# Patient Record
Sex: Male | Born: 1945 | Race: White | Hispanic: No | Marital: Married | Smoking: Former smoker
Health system: Southern US, Community
[De-identification: ages and names within clinical notes are randomized; demographics above are authoritative.]

## PROBLEM LIST (undated history)

## (undated) DIAGNOSIS — K219 Gastro-esophageal reflux disease without esophagitis: Secondary | ICD-10-CM

## (undated) DIAGNOSIS — I10 Essential (primary) hypertension: Secondary | ICD-10-CM

## (undated) DIAGNOSIS — Z91018 Allergy to other foods: Secondary | ICD-10-CM

## (undated) DIAGNOSIS — G4730 Sleep apnea, unspecified: Secondary | ICD-10-CM

## (undated) DIAGNOSIS — G589 Mononeuropathy, unspecified: Secondary | ICD-10-CM

## (undated) HISTORY — PX: APPENDECTOMY: SHX54

## (undated) HISTORY — PX: NASAL SEPTUM SURGERY: SHX37

---

## 2007-06-16 ENCOUNTER — Ambulatory Visit: Payer: Self-pay | Admitting: Otolaryngology

## 2008-03-16 ENCOUNTER — Ambulatory Visit: Payer: Self-pay | Admitting: Oncology

## 2008-03-19 ENCOUNTER — Ambulatory Visit: Payer: Self-pay | Admitting: Oncology

## 2008-04-16 ENCOUNTER — Ambulatory Visit: Payer: Self-pay | Admitting: Oncology

## 2008-05-17 ENCOUNTER — Ambulatory Visit: Payer: Self-pay | Admitting: Internal Medicine

## 2008-06-02 ENCOUNTER — Ambulatory Visit: Payer: Self-pay | Admitting: Internal Medicine

## 2008-06-14 ENCOUNTER — Ambulatory Visit: Payer: Self-pay | Admitting: Internal Medicine

## 2008-06-23 ENCOUNTER — Ambulatory Visit: Payer: Self-pay | Admitting: Internal Medicine

## 2008-07-15 ENCOUNTER — Ambulatory Visit: Payer: Self-pay | Admitting: Internal Medicine

## 2008-08-14 ENCOUNTER — Ambulatory Visit: Payer: Self-pay | Admitting: Internal Medicine

## 2008-08-18 ENCOUNTER — Ambulatory Visit: Payer: Self-pay | Admitting: Internal Medicine

## 2008-09-14 ENCOUNTER — Ambulatory Visit: Payer: Self-pay | Admitting: Internal Medicine

## 2008-11-14 ENCOUNTER — Ambulatory Visit: Payer: Self-pay | Admitting: Internal Medicine

## 2008-12-08 ENCOUNTER — Ambulatory Visit: Payer: Self-pay | Admitting: Internal Medicine

## 2008-12-15 ENCOUNTER — Ambulatory Visit: Payer: Self-pay | Admitting: Internal Medicine

## 2009-03-16 ENCOUNTER — Ambulatory Visit: Payer: Self-pay | Admitting: Internal Medicine

## 2009-03-30 ENCOUNTER — Ambulatory Visit: Payer: Self-pay | Admitting: Internal Medicine

## 2009-04-16 ENCOUNTER — Ambulatory Visit: Payer: Self-pay | Admitting: Internal Medicine

## 2009-07-15 ENCOUNTER — Ambulatory Visit: Payer: Self-pay | Admitting: Internal Medicine

## 2009-08-14 ENCOUNTER — Ambulatory Visit: Payer: Self-pay | Admitting: Internal Medicine

## 2009-11-14 ENCOUNTER — Ambulatory Visit: Payer: Self-pay | Admitting: Internal Medicine

## 2009-11-30 ENCOUNTER — Ambulatory Visit: Payer: Self-pay | Admitting: Internal Medicine

## 2009-12-15 ENCOUNTER — Ambulatory Visit: Payer: Self-pay | Admitting: Internal Medicine

## 2010-01-11 ENCOUNTER — Ambulatory Visit: Payer: Self-pay | Admitting: Internal Medicine

## 2010-03-28 ENCOUNTER — Ambulatory Visit: Payer: Self-pay | Admitting: Internal Medicine

## 2010-04-16 ENCOUNTER — Ambulatory Visit: Payer: Self-pay | Admitting: Internal Medicine

## 2010-06-12 ENCOUNTER — Ambulatory Visit: Payer: Self-pay | Admitting: Internal Medicine

## 2010-06-15 ENCOUNTER — Ambulatory Visit: Payer: Self-pay | Admitting: Internal Medicine

## 2010-10-03 ENCOUNTER — Ambulatory Visit: Payer: Self-pay | Admitting: Internal Medicine

## 2010-10-15 ENCOUNTER — Ambulatory Visit: Payer: Self-pay | Admitting: Internal Medicine

## 2011-05-01 ENCOUNTER — Ambulatory Visit: Payer: Self-pay | Admitting: Internal Medicine

## 2011-05-18 ENCOUNTER — Ambulatory Visit: Payer: Self-pay | Admitting: Internal Medicine

## 2012-02-19 ENCOUNTER — Ambulatory Visit: Payer: Self-pay | Admitting: Internal Medicine

## 2012-03-16 ENCOUNTER — Ambulatory Visit: Payer: Self-pay | Admitting: Internal Medicine

## 2012-08-14 ENCOUNTER — Ambulatory Visit: Payer: Self-pay | Admitting: Internal Medicine

## 2012-08-14 LAB — CBC CANCER CENTER
Basophil #: 0.1 x10 3/mm (ref 0.0–0.1)
Basophil %: 0.3 %
Eosinophil #: 0.1 x10 3/mm (ref 0.0–0.7)
Eosinophil %: 0.2 %
HCT: 47.2 % (ref 40.0–52.0)
Lymphocyte #: 23.7 x10 3/mm — ABNORMAL HIGH (ref 1.0–3.6)
Lymphocyte %: 79.1 %
MCH: 29.3 pg (ref 26.0–34.0)
MCHC: 32.3 g/dL (ref 32.0–36.0)
Monocyte #: 0.6 x10 3/mm (ref 0.2–1.0)
Monocyte %: 2.2 %
Platelet: 157 x10 3/mm (ref 150–440)
RBC: 5.2 10*6/uL (ref 4.40–5.90)
RDW: 14.3 % (ref 11.5–14.5)
WBC: 30 x10 3/mm — ABNORMAL HIGH (ref 3.8–10.6)

## 2012-08-14 LAB — CREATININE, SERUM: EGFR (African American): >60

## 2012-08-14 LAB — URIC ACID: Uric Acid: 4.6 mg/dL (ref 3.5–7.2)

## 2012-09-14 ENCOUNTER — Ambulatory Visit: Payer: Self-pay | Admitting: Internal Medicine

## 2012-12-02 ENCOUNTER — Ambulatory Visit: Payer: Self-pay | Admitting: Internal Medicine

## 2012-12-02 LAB — URIC ACID: Uric Acid: 4.1 mg/dL (ref 3.5–7.2)

## 2012-12-02 LAB — CBC CANCER CENTER
Basophil #: 0.1 x10 3/mm (ref 0.0–0.1)
Basophil %: 0.2 %
Eosinophil #: 0.1 x10 3/mm (ref 0.0–0.7)
Eosinophil %: 0.4 %
HCT: 44.8 % (ref 40.0–52.0)
HGB: 14.8 g/dL (ref 13.0–18.0)
Lymphocyte #: 22.8 x10 3/mm — ABNORMAL HIGH (ref 1.0–3.6)
Lymphocyte %: 84.9 %
MCH: 30.1 pg (ref 26.0–34.0)
Neutrophil %: 12.3 %
Platelet: 134 x10 3/mm — ABNORMAL LOW (ref 150–440)
RDW: 13.8 % (ref 11.5–14.5)

## 2012-12-02 LAB — CREATININE, SERUM: EGFR (Non-African Amer.): >60

## 2012-12-15 ENCOUNTER — Ambulatory Visit: Payer: Self-pay | Admitting: Internal Medicine

## 2012-12-25 LAB — CANCER CTR PLATELET CT: Platelet: 135 x10 3/mm — ABNORMAL LOW (ref 150–440)

## 2013-01-14 ENCOUNTER — Ambulatory Visit: Payer: Self-pay | Admitting: Internal Medicine

## 2013-02-24 ENCOUNTER — Ambulatory Visit: Payer: Self-pay | Admitting: Internal Medicine

## 2013-02-24 LAB — LACTATE DEHYDROGENASE: LDH: 204 U/L (ref 85–241)

## 2013-02-24 LAB — URIC ACID: Uric Acid: 4.8 mg/dL (ref 3.5–7.2)

## 2013-02-24 LAB — CBC CANCER CENTER
Basophil #: 0.1 x10 3/mm (ref 0.0–0.1)
Eosinophil #: 0.2 x10 3/mm (ref 0.0–0.7)
Eosinophil %: 0.5 %
Lymphocyte #: 27.2 x10 3/mm — ABNORMAL HIGH (ref 1.0–3.6)
MCHC: 32.2 g/dL (ref 32.0–36.0)
Monocyte %: 2 %
Platelet: 134 x10 3/mm — ABNORMAL LOW (ref 150–440)
RBC: 5.09 10*6/uL (ref 4.40–5.90)
RDW: 13.9 % (ref 11.5–14.5)
WBC: 31.7 x10 3/mm — ABNORMAL HIGH (ref 3.8–10.6)

## 2013-03-16 ENCOUNTER — Ambulatory Visit: Payer: Self-pay | Admitting: Internal Medicine

## 2013-04-14 LAB — CBC CANCER CENTER
Basophil #: 0.1 x10 3/mm (ref 0.0–0.1)
Basophil %: 0.2 %
Eosinophil %: 0.2 %
HCT: 47 % (ref 40.0–52.0)
HGB: 15.3 g/dL (ref 13.0–18.0)
Lymphocyte #: 26.3 x10 3/mm — ABNORMAL HIGH (ref 1.0–3.6)
Lymphocyte %: 85.9 %
MCH: 30.4 pg (ref 26.0–34.0)
MCHC: 32.6 g/dL (ref 32.0–36.0)
MCV: 93 fL (ref 80–100)
Monocyte #: 0.5 x10 3/mm (ref 0.2–1.0)
Monocyte %: 1.6 %
Neutrophil #: 3.7 x10 3/mm (ref 1.4–6.5)
WBC: 30.7 x10 3/mm — ABNORMAL HIGH (ref 3.8–10.6)

## 2013-04-16 ENCOUNTER — Ambulatory Visit: Payer: Self-pay | Admitting: Internal Medicine

## 2013-04-30 ENCOUNTER — Ambulatory Visit: Payer: Self-pay | Admitting: Otolaryngology

## 2013-05-26 ENCOUNTER — Ambulatory Visit: Payer: Self-pay | Admitting: Internal Medicine

## 2013-05-26 LAB — CBC CANCER CENTER
Basophil #: 0.1 x10 3/mm (ref 0.0–0.1)
Basophil %: 0.3 %
Eosinophil #: 0.1 x10 3/mm (ref 0.0–0.7)
Eosinophil %: 0.4 %
HCT: 46.7 % (ref 40.0–52.0)
HGB: 15.2 g/dL (ref 13.0–18.0)
Lymphocyte #: 28.8 x10 3/mm — ABNORMAL HIGH (ref 1.0–3.6)
Lymphocyte %: 86.4 %
MCH: 30 pg (ref 26.0–34.0)
MCHC: 32.4 g/dL (ref 32.0–36.0)
MCV: 93 fL (ref 80–100)
MONO ABS: 0.6 x10 3/mm (ref 0.2–1.0)
Monocyte %: 1.7 %
NEUTROS PCT: 11.2 %
Neutrophil #: 3.7 x10 3/mm (ref 1.4–6.5)
PLATELETS: 145 x10 3/mm — AB (ref 150–440)
RBC: 5.04 10*6/uL (ref 4.40–5.90)
RDW: 14.2 % (ref 11.5–14.5)
WBC: 33.3 x10 3/mm — ABNORMAL HIGH (ref 3.8–10.6)

## 2013-06-14 ENCOUNTER — Ambulatory Visit: Payer: Self-pay | Admitting: Internal Medicine

## 2013-07-21 ENCOUNTER — Ambulatory Visit: Payer: Self-pay | Admitting: Internal Medicine

## 2013-07-21 LAB — CREATININE, SERUM
CREATININE: 1.23 mg/dL (ref 0.60–1.30)
EGFR (Non-African Amer.): >60

## 2013-07-21 LAB — CBC CANCER CENTER
BASOS ABS: 0 x10 3/mm (ref 0.0–0.1)
Basophil %: 0.1 %
EOS ABS: 0.1 x10 3/mm (ref 0.0–0.7)
Eosinophil %: 0.3 %
HCT: 45.7 % (ref 40.0–52.0)
HGB: 14.7 g/dL (ref 13.0–18.0)
Lymphocyte #: 30 x10 3/mm — ABNORMAL HIGH (ref 1.0–3.6)
Lymphocyte %: 87.8 %
MCH: 30.1 pg (ref 26.0–34.0)
MCHC: 32.2 g/dL (ref 32.0–36.0)
MCV: 93 fL (ref 80–100)
MONOS PCT: 1.7 %
Monocyte #: 0.6 x10 3/mm (ref 0.2–1.0)
NEUTROS ABS: 3.4 x10 3/mm (ref 1.4–6.5)
Neutrophil %: 10.1 %
Platelet: 127 x10 3/mm — ABNORMAL LOW (ref 150–440)
RBC: 4.89 10*6/uL (ref 4.40–5.90)
RDW: 13.9 % (ref 11.5–14.5)
WBC: 34.2 x10 3/mm — AB (ref 3.8–10.6)

## 2013-07-21 LAB — LACTATE DEHYDROGENASE: LDH: 167 U/L (ref 85–241)

## 2013-07-21 LAB — URIC ACID: URIC ACID: 4.8 mg/dL (ref 3.5–7.2)

## 2013-08-14 ENCOUNTER — Ambulatory Visit: Payer: Self-pay | Admitting: Internal Medicine

## 2013-09-22 ENCOUNTER — Ambulatory Visit: Payer: Self-pay | Admitting: Internal Medicine

## 2013-09-22 LAB — CBC CANCER CENTER
BASOS ABS: 0.1 x10 3/mm (ref 0.0–0.1)
BASOS PCT: 0.2 %
EOS ABS: 0.1 x10 3/mm (ref 0.0–0.7)
Eosinophil %: 0.4 %
HCT: 43.7 % (ref 40.0–52.0)
HGB: 14.3 g/dL (ref 13.0–18.0)
LYMPHS PCT: 85 %
Lymphocyte #: 26.2 x10 3/mm — ABNORMAL HIGH (ref 1.0–3.6)
MCH: 30.4 pg (ref 26.0–34.0)
MCHC: 32.7 g/dL (ref 32.0–36.0)
MCV: 93 fL (ref 80–100)
Monocyte #: 0.6 x10 3/mm (ref 0.2–1.0)
Monocyte %: 1.9 %
Neutrophil #: 3.9 x10 3/mm (ref 1.4–6.5)
Neutrophil %: 12.5 %
Platelet: 137 x10 3/mm — ABNORMAL LOW (ref 150–440)
RBC: 4.7 10*6/uL (ref 4.40–5.90)
RDW: 13.9 % (ref 11.5–14.5)
WBC: 30.8 x10 3/mm — ABNORMAL HIGH (ref 3.8–10.6)

## 2013-10-14 ENCOUNTER — Ambulatory Visit: Payer: Self-pay | Admitting: Internal Medicine

## 2013-10-27 LAB — CBC CANCER CENTER
BASOS PCT: 0.1 %
Basophil #: 0 x10 3/mm (ref 0.0–0.1)
EOS PCT: 0.2 %
Eosinophil #: 0.1 x10 3/mm (ref 0.0–0.7)
HCT: 45.5 % (ref 40.0–52.0)
HGB: 14.8 g/dL (ref 13.0–18.0)
Lymphocyte #: 25.6 x10 3/mm — ABNORMAL HIGH (ref 1.0–3.6)
Lymphocyte %: 87.2 %
MCH: 30.2 pg (ref 26.0–34.0)
MCHC: 32.5 g/dL (ref 32.0–36.0)
MCV: 93 fL (ref 80–100)
Monocyte #: 0.5 x10 3/mm (ref 0.2–1.0)
Monocyte %: 1.7 %
Neutrophil #: 3.2 x10 3/mm (ref 1.4–6.5)
Neutrophil %: 10.8 %
Platelet: 126 x10 3/mm — ABNORMAL LOW (ref 150–440)
RBC: 4.89 10*6/uL (ref 4.40–5.90)
RDW: 14.1 % (ref 11.5–14.5)
WBC: 29.4 x10 3/mm — AB (ref 3.8–10.6)

## 2013-10-27 LAB — URIC ACID: Uric Acid: 3.7 mg/dL (ref 3.5–7.2)

## 2013-10-27 LAB — CREATININE, SERUM
CREATININE: 1.03 mg/dL (ref 0.60–1.30)
EGFR (African American): >60

## 2013-10-27 LAB — IRON AND TIBC
IRON SATURATION: 18 %
IRON: 60 ug/dL — AB (ref 65–175)
Iron Bind.Cap.(Total): 328 ug/dL (ref 250–450)
UNBOUND IRON-BIND. CAP.: 268 ug/dL

## 2013-10-27 LAB — FERRITIN: Ferritin (ARMC): 63 ng/mL (ref 8–388)

## 2013-11-14 ENCOUNTER — Ambulatory Visit: Payer: Self-pay | Admitting: Internal Medicine

## 2013-12-15 ENCOUNTER — Ambulatory Visit: Payer: Self-pay | Admitting: Internal Medicine

## 2013-12-15 LAB — CBC CANCER CENTER
BASOS ABS: 0 x10 3/mm (ref 0.0–0.1)
Basophil %: 0.1 %
Eosinophil #: 0.1 x10 3/mm (ref 0.0–0.7)
Eosinophil %: 0.3 %
HCT: 46.3 % (ref 40.0–52.0)
HGB: 14.7 g/dL (ref 13.0–18.0)
LYMPHS PCT: 86.6 %
Lymphocyte #: 29.9 x10 3/mm — ABNORMAL HIGH (ref 1.0–3.6)
MCH: 29.7 pg (ref 26.0–34.0)
MCHC: 31.8 g/dL — AB (ref 32.0–36.0)
MCV: 93 fL (ref 80–100)
MONO ABS: 0.5 x10 3/mm (ref 0.2–1.0)
MONOS PCT: 1.5 %
Neutrophil #: 4 x10 3/mm (ref 1.4–6.5)
Neutrophil %: 11.5 %
Platelet: 130 x10 3/mm — ABNORMAL LOW (ref 150–440)
RBC: 4.96 10*6/uL (ref 4.40–5.90)
RDW: 14.2 % (ref 11.5–14.5)
WBC: 34.5 x10 3/mm — ABNORMAL HIGH (ref 3.8–10.6)

## 2013-12-15 LAB — CREATININE, SERUM
Creatinine: 1.18 mg/dL (ref 0.60–1.30)
EGFR (African American): >60

## 2013-12-15 LAB — LACTATE DEHYDROGENASE: LDH: 216 U/L (ref 85–241)

## 2013-12-15 LAB — URIC ACID: Uric Acid: 3.9 mg/dL (ref 3.5–7.2)

## 2013-12-18 LAB — OCCULT BLOOD X 1 CARD TO LAB, STOOL
OCCULT BLOOD, FECES: NEGATIVE
Occult Blood, Feces: NEGATIVE
Occult Blood, Feces: NEGATIVE

## 2014-01-05 LAB — IRON AND TIBC
IRON SATURATION: 24 %
Iron Bind.Cap.(Total): 340 ug/dL (ref 250–450)
Iron: 83 ug/dL (ref 65–175)
Unbound Iron-Bind.Cap.: 257 ug/dL

## 2014-01-05 LAB — FERRITIN: Ferritin (ARMC): 68 ng/mL (ref 8–388)

## 2014-01-14 ENCOUNTER — Ambulatory Visit: Payer: Self-pay | Admitting: Internal Medicine

## 2014-03-08 DIAGNOSIS — C9110 Chronic lymphocytic leukemia of B-cell type not having achieved remission: Secondary | ICD-10-CM | POA: Insufficient documentation

## 2014-03-30 ENCOUNTER — Ambulatory Visit: Payer: Self-pay | Admitting: Internal Medicine

## 2014-06-30 ENCOUNTER — Ambulatory Visit
Admit: 2014-06-30 | Disposition: A | Discharge status: Home / Self Care | Payer: Self-pay | Attending: Internal Medicine | Admitting: Internal Medicine

## 2014-07-16 ENCOUNTER — Ambulatory Visit
Admit: 2014-07-16 | Disposition: A | Discharge status: Home / Self Care | Payer: Self-pay | Attending: Internal Medicine | Admitting: Internal Medicine

## 2014-08-03 LAB — CBC CANCER CENTER
BASOS PCT: 0.2 %
Basophil #: 0.1 x10 3/mm (ref 0.0–0.1)
EOS PCT: 0.2 %
Eosinophil #: 0.1 x10 3/mm (ref 0.0–0.7)
HCT: 45.2 % (ref 40.0–52.0)
HGB: 14.9 g/dL (ref 13.0–18.0)
LYMPHS ABS: 34.2 x10 3/mm — AB (ref 1.0–3.6)
Lymphocyte %: 88 %
MCH: 29.8 pg (ref 26.0–34.0)
MCHC: 32.9 g/dL (ref 32.0–36.0)
MCV: 91 fL (ref 80–100)
Monocyte #: 0.7 x10 3/mm (ref 0.2–1.0)
Monocyte %: 1.9 %
Neutrophil #: 3.8 x10 3/mm (ref 1.4–6.5)
Neutrophil %: 9.7 %
Platelet: 127 x10 3/mm — ABNORMAL LOW (ref 150–440)
RBC: 4.99 10*6/uL (ref 4.40–5.90)
RDW: 14 % (ref 11.5–14.5)
WBC: 38.9 x10 3/mm — ABNORMAL HIGH (ref 3.8–10.6)

## 2014-08-03 LAB — IRON AND TIBC
IRON SATURATION: 36
IRON: 129 ug/dL
Iron Bind.Cap.(Total): 358 (ref 250–450)
UNBOUND IRON-BIND. CAP.: 228.8

## 2014-08-03 LAB — URIC ACID: Uric Acid: 5.6 mg/dL

## 2014-08-03 LAB — LACTATE DEHYDROGENASE: LDH: 145 U/L

## 2014-08-03 LAB — CREATININE, SERUM
Creatinine: 1.08 mg/dL
EGFR (African American): >60

## 2014-08-05 DIAGNOSIS — I10 Essential (primary) hypertension: Secondary | ICD-10-CM | POA: Insufficient documentation

## 2014-08-05 LAB — OCCULT BLOOD X 1 CARD TO LAB, STOOL
OCCULT BLOOD, FECES: NEGATIVE
OCCULT BLOOD, FECES: NEGATIVE
Occult Blood, Feces: NEGATIVE

## 2014-10-11 ENCOUNTER — Other Ambulatory Visit: Payer: Self-pay | Admitting: *Deleted

## 2014-10-11 DIAGNOSIS — C9110 Chronic lymphocytic leukemia of B-cell type not having achieved remission: Secondary | ICD-10-CM

## 2014-10-12 ENCOUNTER — Other Ambulatory Visit: Payer: Self-pay

## 2014-10-12 ENCOUNTER — Inpatient Hospital Stay: Payer: Medicare Other | Attending: Oncology

## 2014-10-12 ENCOUNTER — Other Ambulatory Visit: Payer: Self-pay | Admitting: *Deleted

## 2014-10-12 ENCOUNTER — Other Ambulatory Visit: Payer: Self-pay | Admitting: Family Medicine

## 2014-10-12 DIAGNOSIS — C9110 Chronic lymphocytic leukemia of B-cell type not having achieved remission: Secondary | ICD-10-CM | POA: Insufficient documentation

## 2014-10-12 LAB — CBC WITH DIFFERENTIAL/PLATELET
BASOS ABS: 0.1 10*3/uL (ref 0–0.1)
BASOS PCT: 0 %
Eosinophils Absolute: 0.1 10*3/uL (ref 0–0.7)
Eosinophils Relative: 0 %
HEMATOCRIT: 43.9 % (ref 40.0–52.0)
Hemoglobin: 14.5 g/dL (ref 13.0–18.0)
Lymphocytes Relative: 87 %
Lymphs Abs: 27.9 10*3/uL — ABNORMAL HIGH (ref 1.0–3.6)
MCH: 30.1 pg (ref 26.0–34.0)
MCHC: 33.1 g/dL (ref 32.0–36.0)
MCV: 91 fL (ref 80.0–100.0)
MONO ABS: 0.6 10*3/uL (ref 0.2–1.0)
Monocytes Relative: 2 %
NEUTROS ABS: 3.4 10*3/uL (ref 1.4–6.5)
Neutrophils Relative %: 11 %
Platelets: 120 10*3/uL — ABNORMAL LOW (ref 150–440)
RBC: 4.82 MIL/uL (ref 4.40–5.90)
RDW: 14.1 % (ref 11.5–14.5)
WBC: 32 10*3/uL — AB (ref 3.8–10.6)

## 2014-10-12 LAB — CREATININE, SERUM
Creatinine, Ser: 1.17 mg/dL (ref 0.61–1.24)
GFR calc Af Amer: >60 mL/min (ref >60)
GFR calc non Af Amer: >60 mL/min (ref >60)

## 2014-10-12 LAB — URIC ACID: URIC ACID, SERUM: 6.2 mg/dL (ref 4.4–7.6)

## 2014-10-12 LAB — LACTATE DEHYDROGENASE: LDH: 131 U/L (ref 98–192)

## 2014-12-09 ENCOUNTER — Ambulatory Visit: Payer: Self-pay | Admitting: Oncology

## 2014-12-09 ENCOUNTER — Other Ambulatory Visit: Payer: Self-pay

## 2014-12-23 ENCOUNTER — Other Ambulatory Visit: Payer: Self-pay | Admitting: *Deleted

## 2014-12-23 ENCOUNTER — Encounter: Payer: Self-pay | Admitting: Oncology

## 2014-12-23 ENCOUNTER — Inpatient Hospital Stay (HOSPITAL_BASED_OUTPATIENT_CLINIC_OR_DEPARTMENT_OTHER): Payer: Medicare Other | Admitting: Oncology

## 2014-12-23 ENCOUNTER — Inpatient Hospital Stay: Payer: Medicare Other | Attending: Oncology

## 2014-12-23 VITALS — BP 118/69 | HR 66 | Temp 97.5°F | Wt 195.4 lb

## 2014-12-23 DIAGNOSIS — E039 Hypothyroidism, unspecified: Secondary | ICD-10-CM | POA: Insufficient documentation

## 2014-12-23 DIAGNOSIS — R5383 Other fatigue: Secondary | ICD-10-CM | POA: Diagnosis not present

## 2014-12-23 DIAGNOSIS — Z79899 Other long term (current) drug therapy: Secondary | ICD-10-CM

## 2014-12-23 DIAGNOSIS — C9110 Chronic lymphocytic leukemia of B-cell type not having achieved remission: Secondary | ICD-10-CM | POA: Diagnosis not present

## 2014-12-23 DIAGNOSIS — Z87891 Personal history of nicotine dependence: Secondary | ICD-10-CM | POA: Insufficient documentation

## 2014-12-23 DIAGNOSIS — K589 Irritable bowel syndrome without diarrhea: Secondary | ICD-10-CM | POA: Diagnosis not present

## 2014-12-23 DIAGNOSIS — I341 Nonrheumatic mitral (valve) prolapse: Secondary | ICD-10-CM | POA: Insufficient documentation

## 2014-12-23 LAB — CBC WITH DIFFERENTIAL/PLATELET
BASOS PCT: 0 %
Basophils Absolute: 0.1 10*3/uL (ref 0–0.1)
Eosinophils Absolute: 0.1 10*3/uL (ref 0–0.7)
Eosinophils Relative: 0 %
HEMATOCRIT: 46.4 % (ref 40.0–52.0)
HEMOGLOBIN: 15.3 g/dL (ref 13.0–18.0)
LYMPHS ABS: 30.1 10*3/uL — AB (ref 1.0–3.6)
Lymphocytes Relative: 87 %
MCH: 30.4 pg (ref 26.0–34.0)
MCHC: 33 g/dL (ref 32.0–36.0)
MCV: 92.2 fL (ref 80.0–100.0)
MONO ABS: 0.6 10*3/uL (ref 0.2–1.0)
Monocytes Relative: 2 %
NEUTROS ABS: 3.6 10*3/uL (ref 1.4–6.5)
NEUTROS PCT: 11 %
Platelets: 128 10*3/uL — ABNORMAL LOW (ref 150–440)
RBC: 5.03 MIL/uL (ref 4.40–5.90)
RDW: 13.9 % (ref 11.5–14.5)
WBC: 34.5 10*3/uL — ABNORMAL HIGH (ref 3.8–10.6)

## 2014-12-23 LAB — URIC ACID: URIC ACID, SERUM: 6 mg/dL (ref 4.4–7.6)

## 2014-12-23 LAB — LACTATE DEHYDROGENASE: LDH: 138 U/L (ref 98–192)

## 2014-12-23 LAB — CREATININE, SERUM
Creatinine, Ser: 1.24 mg/dL (ref 0.61–1.24)
GFR calc Af Amer: >60 mL/min (ref >60)
GFR calc non Af Amer: 58 mL/min — ABNORMAL LOW (ref >60)

## 2014-12-23 NOTE — Progress Notes (Signed)
Patient former smoker.  Patient here today for CLL.  Patient of Dr. Inez Pilgrim.  Patient does have living will.  Not on file.  Patient to bring copy.

## 2014-12-25 ENCOUNTER — Encounter: Payer: Self-pay | Admitting: Oncology

## 2014-12-25 DIAGNOSIS — E039 Hypothyroidism, unspecified: Secondary | ICD-10-CM | POA: Insufficient documentation

## 2014-12-25 DIAGNOSIS — G4730 Sleep apnea, unspecified: Secondary | ICD-10-CM | POA: Insufficient documentation

## 2014-12-25 DIAGNOSIS — I341 Nonrheumatic mitral (valve) prolapse: Secondary | ICD-10-CM | POA: Insufficient documentation

## 2014-12-25 NOTE — Progress Notes (Signed)
[place] @ [facility] Telephone:(336) [phone]  Fax:(336) [phone]     [place] OB: 11/16/1945  MR#: [id number]  ULA#:[id number]  Patient Care Team: Rusty Aus, MD as PCP - General (Internal Medicine)  CHIEF COMPLAINT:  Chief Complaint  Patient presents with  . Fatigue   DIAGNOSIS:  Chief Complaint/Diagnosis   CLL, 5% with 13q deletion, Zap-70 negative.  Rai I stage 0.  FlOW CYTOMETRY PROFILE 11/29/08   INTERVAL HISTORY: 69 year old gentleman came today for further follow-up regarding stage 0 chronic lymphocytic leukemia.  Patient complains of fatigue\ No other significant complaints. REVIEW OF SYSTEMS:   \GENERAL:  Feels good.  Active.  No fevers, sweats or weight loss. PERFORMANCE STATUS (ECOG):0 HEENT:  No visual changes, runny nose, sore throat, mouth sores or tenderness. Lungs: No shortness of breath or cough.  No hemoptysis. Cardiac:  No chest pain, palpitations, orthopnea, or PND. GI:  No nausea, vomiting, diarrhea, constipation, melena or hematochezia. GU:  No urgency, frequency, dysuria, or hematuria. Musculoskeletal:  No back pain.  No joint pain.  No muscle tenderness. Extremities:  No pain or swelling. Skin:  No rashes or skin changes. Neuro:  No headache, numbness or weakness, balance or coordination issues. Endocrine:  No diabetes, thyroid issues, hot flashes or night sweats. Psych:  No mood changes, depression or anxiety. Pain:  No focal pain. Review of systems:  All other systems reviewed and found to be negative. As per HPI. Otherwise, a complete review of systems is negatve.   PFSH:  Additional Past Medical and Surgical History Past medical history: Hypothyroidism, mitral valve prolapse, IBS.  Past surgical history: Appendectomy.  Family history: Negative and noncontributory.  Social history: Denies tobacco, occasional alcohol.   ADVANCED DIRECTIVES:  Patient does have advance healthcare directive, Patient   does not desire to make any  changes HEALTH MAINTENANCE: Social History  Substance Use Topics  . Smoking status: Former Research scientist (life sciences)  . Smokeless tobacco: None  . Alcohol Use: None      Allergies  Allergen Reactions  . Sertraline Hcl Diarrhea    Current Outpatient Prescriptions  Medication Sig Dispense Refill  . buPROPion (WELLBUTRIN XL) 150 MG 24 hr tablet Take by mouth.    . etodolac (LODINE) 400 MG tablet Take by mouth.    . levothyroxine (SYNTHROID, LEVOTHROID) 125 MCG tablet Take by mouth.    Marland Kitchen lisinopril-hydrochlorothiazide (PRINZIDE,ZESTORETIC) 10-12.5 MG per tablet TAKE ONE TABLET BY MOUTH ONCE DAILY    . zolpidem (AMBIEN) 10 MG tablet TAKE ONE TABLET BY MOUTH AT BEDTIME    . gabapentin (NEURONTIN) 300 MG capsule      No current facility-administered medications for this visit.    OBJECTIVE:  Filed Vitals:   12/23/14 1405  BP: 118/69  Pulse: 66  Temp: 97.5 F (36.4 C)     There is no height on file to calculate BMI.    ECOG FS:0 - Asymptomatic  PHYSICAL EXAM: Goal status: Performance status is good.  Patient has not lost significant weight HEENT: No evidence of stomatitis. Sclera and conjunctivae :: No jaundice.   pale looking. Lungs: Air  entry equal on both sides.  No rhonchi.  No rales.  Cardiac: Heart sounds are normal.  No pericardial rub.  No murmur. Lymphatic system: Cervical, axillary, inguinal, lymph nodes not palpable GI: Abdomen is soft.  No ascites.  Liver spleen not palpable.  No tenderness.  Bowel sounds are within normal limit Lower extremity: No edema Neurological system: Higher functions, cranial nerves intact no evidence  of peripheral neuropathy. Skin: No rash.  No ecchymosis.Marland Kitchen   LAB RESULTS:  CBC Latest Ref Rng 12/23/2014 10/12/2014  WBC 3.8 - 10.6 K/uL 34.5(H) 32.0(H)  Hemoglobin 13.0 - 18.0 g/dL 15.3 14.5  Hematocrit 40.0 - 52.0 % 46.4 43.9  Platelets 150 - 440 K/uL 128(L) 120(L)    Appointment on 12/23/2014  Component Date Value Ref Range Status  . WBC 12/23/2014  34.5* 3.8 - 10.6 K/uL Final  . RBC 12/23/2014 5.03  4.40 - 5.90 MIL/uL Final  . Hemoglobin 12/23/2014 15.3  13.0 - 18.0 g/dL Final  . HCT 12/23/2014 46.4  40.0 - 52.0 % Final  . MCV 12/23/2014 92.2  80.0 - 100.0 fL Final  . MCH 12/23/2014 30.4  26.0 - 34.0 pg Final  . MCHC 12/23/2014 33.0  32.0 - 36.0 g/dL Final  . RDW 12/23/2014 13.9  11.5 - 14.5 % Final  . Platelets 12/23/2014 128* 150 - 440 K/uL Final  . Neutrophils Relative % 12/23/2014 11   Final  . Neutro Abs 12/23/2014 3.6  1.4 - 6.5 K/uL Final  . Lymphocytes Relative 12/23/2014 87   Final  . Lymphs Abs 12/23/2014 30.1* 1.0 - 3.6 K/uL Final  . Monocytes Relative 12/23/2014 2   Final  . Monocytes Absolute 12/23/2014 0.6  0.2 - 1.0 K/uL Final  . Eosinophils Relative 12/23/2014 0   Final  . Eosinophils Absolute 12/23/2014 0.1  0 - 0.7 K/uL Final  . Basophils Relative 12/23/2014 0   Final  . Basophils Absolute 12/23/2014 0.1  0 - 0.1 K/uL Final  . Creatinine, Ser 12/23/2014 1.24  0.61 - 1.24 mg/dL Final  . GFR calc non Af Amer 12/23/2014 58* >60 mL/min Final  . GFR calc Af Amer 12/23/2014 >60  >60 mL/min Final   Comment: (NOTE) The eGFR has been calculated using the CKD EPI equation. This calculation has not been validated in all clinical situations. eGFR's persistently <60 mL/min signify possible Chronic Kidney Disease.   Marland Kitchen LDH 12/23/2014 138  98 - 192 U/L Final  . Uric Acid, Serum 12/23/2014 6.0  4.4 - 7.6 mg/dL Final        ASSESSMENT: Chronic lymphocytic leukemia stage 0 13 every deletion MEDICAL DECISION MAKING:  All lab data has been reviewed.  CBC has remained stable worker of one year.  Now will be switched over to every 4 months checkup with blood count if it remains stable for one year and then every 6 month. Patient was advised to call me if there is any significant problem  Patient expressed understanding and was in agreement with this plan. He also understands that He can call clinic at any time with any  questions, concerns, or complaints.    No matching staging information was found for the patient.  Forest Gleason, MD   12/25/2014 9:11 AM

## 2015-04-25 ENCOUNTER — Inpatient Hospital Stay: Payer: Medicare Other | Admitting: Oncology

## 2015-04-25 ENCOUNTER — Inpatient Hospital Stay: Payer: Medicare Other

## 2015-04-25 ENCOUNTER — Inpatient Hospital Stay: Payer: Medicare Other | Attending: Oncology

## 2015-04-25 DIAGNOSIS — I341 Nonrheumatic mitral (valve) prolapse: Secondary | ICD-10-CM | POA: Insufficient documentation

## 2015-04-25 DIAGNOSIS — C9110 Chronic lymphocytic leukemia of B-cell type not having achieved remission: Secondary | ICD-10-CM | POA: Insufficient documentation

## 2015-04-25 DIAGNOSIS — Z87891 Personal history of nicotine dependence: Secondary | ICD-10-CM | POA: Insufficient documentation

## 2015-04-25 DIAGNOSIS — E039 Hypothyroidism, unspecified: Secondary | ICD-10-CM | POA: Insufficient documentation

## 2015-04-25 DIAGNOSIS — K589 Irritable bowel syndrome without diarrhea: Secondary | ICD-10-CM | POA: Insufficient documentation

## 2015-04-25 DIAGNOSIS — Z79899 Other long term (current) drug therapy: Secondary | ICD-10-CM | POA: Insufficient documentation

## 2015-05-16 ENCOUNTER — Ambulatory Visit: Payer: Medicare Other | Admitting: Oncology

## 2015-05-16 ENCOUNTER — Other Ambulatory Visit: Payer: Medicare Other

## 2015-05-16 ENCOUNTER — Inpatient Hospital Stay (HOSPITAL_BASED_OUTPATIENT_CLINIC_OR_DEPARTMENT_OTHER): Payer: Medicare Other | Admitting: Oncology

## 2015-05-16 ENCOUNTER — Inpatient Hospital Stay: Payer: Medicare Other

## 2015-05-16 ENCOUNTER — Encounter: Payer: Self-pay | Admitting: Oncology

## 2015-05-16 VITALS — BP 128/84 | HR 57 | Temp 96.7°F | Wt 197.1 lb

## 2015-05-16 DIAGNOSIS — Z79899 Other long term (current) drug therapy: Secondary | ICD-10-CM

## 2015-05-16 DIAGNOSIS — C9110 Chronic lymphocytic leukemia of B-cell type not having achieved remission: Secondary | ICD-10-CM | POA: Diagnosis present

## 2015-05-16 DIAGNOSIS — E039 Hypothyroidism, unspecified: Secondary | ICD-10-CM | POA: Diagnosis not present

## 2015-05-16 DIAGNOSIS — K589 Irritable bowel syndrome without diarrhea: Secondary | ICD-10-CM | POA: Diagnosis not present

## 2015-05-16 DIAGNOSIS — Z87891 Personal history of nicotine dependence: Secondary | ICD-10-CM | POA: Diagnosis not present

## 2015-05-16 DIAGNOSIS — I341 Nonrheumatic mitral (valve) prolapse: Secondary | ICD-10-CM | POA: Diagnosis not present

## 2015-05-16 LAB — CBC WITH DIFFERENTIAL/PLATELET
BASOS PCT: 0 %
Basophils Absolute: 0 10*3/uL (ref 0–0.1)
EOS ABS: 0.1 10*3/uL (ref 0–0.7)
EOS PCT: 0 %
HCT: 46.2 % (ref 40.0–52.0)
Hemoglobin: 15.5 g/dL (ref 13.0–18.0)
LYMPHS ABS: 35.2 10*3/uL — AB (ref 1.0–3.6)
Lymphocytes Relative: 89 %
MCH: 30.4 pg (ref 26.0–34.0)
MCHC: 33.6 g/dL (ref 32.0–36.0)
MCV: 90.4 fL (ref 80.0–100.0)
Monocytes Absolute: 0.7 10*3/uL (ref 0.2–1.0)
Monocytes Relative: 2 %
Neutro Abs: 3.6 10*3/uL (ref 1.4–6.5)
Neutrophils Relative %: 9 %
PLATELETS: 115 10*3/uL — AB (ref 150–440)
RBC: 5.11 MIL/uL (ref 4.40–5.90)
RDW: 14 % (ref 11.5–14.5)
WBC: 39.6 10*3/uL — ABNORMAL HIGH (ref 3.8–10.6)

## 2015-05-16 LAB — COMPREHENSIVE METABOLIC PANEL
ALBUMIN: 4.2 g/dL (ref 3.5–5.0)
ALT: 23 U/L (ref 17–63)
ANION GAP: 4 — AB (ref 5–15)
AST: 26 U/L (ref 15–41)
Alkaline Phosphatase: 61 U/L (ref 38–126)
BUN: 20 mg/dL (ref 6–20)
CHLORIDE: 106 mmol/L (ref 101–111)
CO2: 27 mmol/L (ref 22–32)
Calcium: 8.8 mg/dL — ABNORMAL LOW (ref 8.9–10.3)
Creatinine, Ser: 1.25 mg/dL — ABNORMAL HIGH (ref 0.61–1.24)
GFR calc Af Amer: >60 mL/min (ref >60)
GFR calc non Af Amer: 57 mL/min — ABNORMAL LOW (ref >60)
Glucose, Bld: 76 mg/dL (ref 65–99)
Potassium: 4 mmol/L (ref 3.5–5.1)
SODIUM: 137 mmol/L (ref 135–145)
Total Bilirubin: 0.8 mg/dL (ref 0.3–1.2)
Total Protein: 6.6 g/dL (ref 6.5–8.1)

## 2015-05-16 NOTE — Progress Notes (Signed)
Patient would like to speak to MD regarding his being "sick".  Wants to know how MD thinks the diesease is progressing.  Also states his left nipple is hard and tender at times with some edema.

## 2015-05-16 NOTE — Progress Notes (Signed)
[place] @ [facility] Telephone:(336) [phone]  Fax:(336) [phone]     Chad Gaines OB: [birth year]  MR#: [id number]  QRF#:[id number]  Patient Care Team: Rusty Aus, MD as PCP - General (Internal Medicine)  CHIEF COMPLAINT:  Chief Complaint  Patient presents with  . CLL   DIAGNOSIS:  Chief Complaint/Diagnosis   CLL, 5% with 13q deletion, Zap-70 negative.  Rai I stage 0.  FlOW CYTOMETRY PROFILE 11/29/08   INTERVAL HISTORY: 70 year old gentleman came today for further follow-up regarding stage 0 chronic lymphocytic leukemia.  Patient complains of fatigue\.        Patient would like to speak to MD regarding his being "sick". Wants to know how MD thinks the diesease is progressing. Also states his left nipple is hard and tender at times with some edema.    No other significant complaints.  Patient is here for ongoing evaluation and follow-up REVIEW OF SYSTEMS:   \GENERAL:  Feels good.  Active.  No fevers, sweats or weight loss. PERFORMANCE STATUS (ECOG):0 HEENT:  No visual changes, runny nose, sore throat, mouth sores or tenderness. Lungs: No shortness of breath or cough.  No hemoptysis. Cardiac:  No chest pain, palpitations, orthopnea, or PND. GI:  No nausea, vomiting, diarrhea, constipation, melena or hematochezia. GU:  No urgency, frequency, dysuria, or hematuria. Musculoskeletal:  No back pain.  No joint pain.  No muscle tenderness. Extremities:  No pain or swelling. Skin:  No rashes or skin changes. Neuro:  No headache, numbness or weakness, balance or coordination issues. Endocrine:  No diabetes, thyroid issues, hot flashes or night sweats. Psych:  No mood changes, depression or anxiety. Pain:  No focal pain. Review of systems:  All other systems reviewed and found to be negative. As per HPI. Otherwise, a complete review of systems is negatve.   PFSH:  Additional Past Medical and Surgical History Past medical history: Hypothyroidism, mitral valve prolapse,  IBS.  Past surgical history: Appendectomy.  Family history: Negative and noncontributory.  Social history: Denies tobacco, occasional alcohol.   ADVANCED DIRECTIVES:  Patient does have advance healthcare directive, Patient   does not desire to make any changes HEALTH MAINTENANCE: Social History  Substance Use Topics  . Smoking status: Former Research scientist (life sciences)  . Smokeless tobacco: None  . Alcohol Use: None      Allergies  Allergen Reactions  . Sertraline Hcl Diarrhea    Current Outpatient Prescriptions  Medication Sig Dispense Refill  . etodolac (LODINE) 400 MG tablet Take by mouth.    . gabapentin (NEURONTIN) 300 MG capsule     . levothyroxine (SYNTHROID, LEVOTHROID) 125 MCG tablet Take by mouth.    Marland Kitchen lisinopril-hydrochlorothiazide (PRINZIDE,ZESTORETIC) 10-12.5 MG per tablet TAKE ONE TABLET BY MOUTH ONCE DAILY    . zolpidem (AMBIEN) 10 MG tablet TAKE ONE TABLET BY MOUTH AT BEDTIME    . buPROPion (WELLBUTRIN XL) 150 MG 24 hr tablet Take by mouth.     No current facility-administered medications for this visit.    OBJECTIVE:  Filed Vitals:   05/16/15 0928  BP: 128/84  Pulse: 57  Temp: 96.7 F (35.9 C)     There is no height on file to calculate BMI.    ECOG FS:0 - Asymptomatic  PHYSICAL EXAM: Goal status: Performance status is good.  Patient has not lost significant weight HEENT: No evidence of stomatitis. Sclera and conjunctivae :: No jaundice.   pale looking. Lungs: Air  entry equal on both sides.  No rhonchi.  No rales.  Cardiac: Heart  sounds are normal.  No pericardial rub.  No murmur. Lymphatic system: Cervical, axillary, inguinal, lymph nodes not palpable GI: Abdomen is soft.  No ascites.  Liver spleen not palpable.  No tenderness.  Bowel sounds are within normal limit Lower extremity: No edema Neurological system: Higher functions, cranial nerves intact no evidence of peripheral neuropathy. Skin: No rash.  No ecchymosis.. Examination of the nipple is completely  normal No palpable lymphadenopathy  LAB RESULTS:  CBC Latest Ref Rng 05/16/2015 12/23/2014  WBC 3.8 - 10.6 K/uL 39.6(H) 34.5(H)  Hemoglobin 13.0 - 18.0 g/dL 15.5 15.3  Hematocrit 40.0 - 52.0 % 46.2 46.4  Platelets 150 - 440 K/uL 115(L) 128(L)    Appointment on 05/16/2015  Component Date Value Ref Range Status  . WBC 05/16/2015 39.6* 3.8 - 10.6 K/uL Final  . RBC 05/16/2015 5.11  4.40 - 5.90 MIL/uL Final  . Hemoglobin 05/16/2015 15.5  13.0 - 18.0 g/dL Final  . HCT 05/16/2015 46.2  40.0 - 52.0 % Final  . MCV 05/16/2015 90.4  80.0 - 100.0 fL Final  . MCH 05/16/2015 30.4  26.0 - 34.0 pg Final  . MCHC 05/16/2015 33.6  32.0 - 36.0 g/dL Final  . RDW 05/16/2015 14.0  11.5 - 14.5 % Final  . Platelets 05/16/2015 115* 150 - 440 K/uL Final  . Neutrophils Relative % 05/16/2015 9   Final  . Neutro Abs 05/16/2015 3.6  1.4 - 6.5 K/uL Final  . Lymphocytes Relative 05/16/2015 89   Final  . Lymphs Abs 05/16/2015 35.2* 1.0 - 3.6 K/uL Final  . Monocytes Relative 05/16/2015 2   Final  . Monocytes Absolute 05/16/2015 0.7  0.2 - 1.0 K/uL Final  . Eosinophils Relative 05/16/2015 0   Final  . Eosinophils Absolute 05/16/2015 0.1  0 - 0.7 K/uL Final  . Basophils Relative 05/16/2015 0   Final  . Basophils Absolute 05/16/2015 0.0  0 - 0.1 K/uL Final  . Sodium 05/16/2015 137  135 - 145 mmol/L Final  . Potassium 05/16/2015 4.0  3.5 - 5.1 mmol/L Final  . Chloride 05/16/2015 106  101 - 111 mmol/L Final  . CO2 05/16/2015 27  22 - 32 mmol/L Final  . Glucose, Bld 05/16/2015 76  65 - 99 mg/dL Final  . BUN 05/16/2015 20  6 - 20 mg/dL Final  . Creatinine, Ser 05/16/2015 1.25* 0.61 - 1.24 mg/dL Final  . Calcium 05/16/2015 8.8* 8.9 - 10.3 mg/dL Final  . Total Protein 05/16/2015 6.6  6.5 - 8.1 g/dL Final  . Albumin 05/16/2015 4.2  3.5 - 5.0 g/dL Final  . AST 05/16/2015 26  15 - 41 U/L Final  . ALT 05/16/2015 23  17 - 63 U/L Final  . Alkaline Phosphatase 05/16/2015 61  38 - 126 U/L Final  . Total Bilirubin 05/16/2015  0.8  0.3 - 1.2 mg/dL Final  . GFR calc non Af Amer 05/16/2015 57* >60 mL/min Final  . GFR calc Af Amer 05/16/2015 >60  >60 mL/min Final   Comment: (NOTE) The eGFR has been calculated using the CKD EPI equation. This calculation has not been validated in all clinical situations. eGFR's persistently <60 mL/min signify possible Chronic Kidney Disease.   . Anion gap 05/16/2015 4* 5 - 15 Final        ASSESSMENT: Chronic lymphocytic leukemia stage 0 13 every deletion MEDICAL DECISION MAKING:  All lab data has been reviewed.  CBC has remained stable worker of one year.  Now will be there is no evidence  of progressive disease. Patient was explained that viral infections are very common with chronic lymphocytic leukemia Patient had a pneumococcal vaccine in 2015 Patient was advised yearly flu vaccine Patient was last to give me a call if there is any more hardening of the left nipple with inflammation  Patient expressed understanding and was in agreement with this plan. He also understands that He can call clinic at any time with any questions, concerns, or complaints.    No matching staging information was found for the patient.  Forest Gleason, MD   05/16/2015 9:36 AM

## 2015-11-09 ENCOUNTER — Telehealth: Payer: Self-pay | Admitting: *Deleted

## 2015-11-09 NOTE — Telephone Encounter (Signed)
Per cancer center medical records. Records will be transferred upon request to [place].

## 2015-11-09 NOTE — Telephone Encounter (Signed)
-----   Message from Cephus Richer sent at 11/09/2015  3:00 PM EDT ----- Contact: [phone]  Per  Pt wants to  Transfer  Services to Dr. Laurance Flatten at  [facility] [phone].  He had appt with Dr. Jacinto Reap on 11-14-15. I will cancel appt per pt .

## 2015-11-14 ENCOUNTER — Inpatient Hospital Stay: Payer: Medicare Other

## 2015-11-14 ENCOUNTER — Inpatient Hospital Stay: Payer: Medicare Other | Admitting: Hematology and Oncology

## 2021-04-11 ENCOUNTER — Other Ambulatory Visit: Payer: Self-pay | Admitting: Internal Medicine

## 2021-04-11 DIAGNOSIS — M5010 Cervical disc disorder with radiculopathy, unspecified cervical region: Secondary | ICD-10-CM

## 2021-04-11 DIAGNOSIS — C9110 Chronic lymphocytic leukemia of B-cell type not having achieved remission: Secondary | ICD-10-CM

## 2021-05-01 ENCOUNTER — Ambulatory Visit
Admission: RE | Admit: 2021-05-01 | Discharge: 2021-05-01 | Disposition: A | Discharge status: Home / Self Care | Payer: Medicare Other | Source: Ambulatory Visit | Attending: Internal Medicine | Admitting: Internal Medicine

## 2021-05-01 ENCOUNTER — Other Ambulatory Visit: Payer: Self-pay

## 2021-05-01 DIAGNOSIS — C9110 Chronic lymphocytic leukemia of B-cell type not having achieved remission: Secondary | ICD-10-CM | POA: Insufficient documentation

## 2021-05-01 DIAGNOSIS — M5010 Cervical disc disorder with radiculopathy, unspecified cervical region: Secondary | ICD-10-CM | POA: Insufficient documentation

## 2021-06-26 ENCOUNTER — Encounter: Payer: Self-pay | Admitting: Ophthalmology

## 2021-06-28 NOTE — Discharge Instructions (Signed)

## 2021-07-03 ENCOUNTER — Ambulatory Visit
Admission: RE | Admit: 2021-07-03 | Discharge: 2021-07-03 | Disposition: A | Discharge status: Home / Self Care | Payer: Medicare Other | Attending: Ophthalmology | Admitting: Ophthalmology

## 2021-07-03 ENCOUNTER — Ambulatory Visit: Payer: Medicare Other | Admitting: Anesthesiology

## 2021-07-03 ENCOUNTER — Encounter: Payer: Self-pay | Admitting: Ophthalmology

## 2021-07-03 ENCOUNTER — Encounter
Admission: RE | Disposition: A | Discharge status: Home / Self Care | Payer: Self-pay | Source: Home / Self Care | Attending: Ophthalmology

## 2021-07-03 ENCOUNTER — Other Ambulatory Visit: Payer: Self-pay

## 2021-07-03 DIAGNOSIS — Z79899 Other long term (current) drug therapy: Secondary | ICD-10-CM | POA: Insufficient documentation

## 2021-07-03 DIAGNOSIS — K219 Gastro-esophageal reflux disease without esophagitis: Secondary | ICD-10-CM | POA: Diagnosis not present

## 2021-07-03 DIAGNOSIS — Z87891 Personal history of nicotine dependence: Secondary | ICD-10-CM | POA: Insufficient documentation

## 2021-07-03 DIAGNOSIS — I2510 Atherosclerotic heart disease of native coronary artery without angina pectoris: Secondary | ICD-10-CM | POA: Diagnosis not present

## 2021-07-03 DIAGNOSIS — H2512 Age-related nuclear cataract, left eye: Secondary | ICD-10-CM | POA: Diagnosis present

## 2021-07-03 DIAGNOSIS — E039 Hypothyroidism, unspecified: Secondary | ICD-10-CM | POA: Diagnosis not present

## 2021-07-03 DIAGNOSIS — G4730 Sleep apnea, unspecified: Secondary | ICD-10-CM | POA: Diagnosis not present

## 2021-07-03 DIAGNOSIS — I509 Heart failure, unspecified: Secondary | ICD-10-CM | POA: Insufficient documentation

## 2021-07-03 DIAGNOSIS — I110 Hypertensive heart disease with heart failure: Secondary | ICD-10-CM | POA: Diagnosis not present

## 2021-07-03 HISTORY — DX: Gastro-esophageal reflux disease without esophagitis: K21.9

## 2021-07-03 HISTORY — PX: CATARACT EXTRACTION W/PHACO: SHX586

## 2021-07-03 HISTORY — DX: Allergy to other foods: Z91.018

## 2021-07-03 HISTORY — DX: Essential (primary) hypertension: I10

## 2021-07-03 HISTORY — DX: Mononeuropathy, unspecified: G58.9

## 2021-07-03 HISTORY — DX: Sleep apnea, unspecified: G47.30

## 2021-07-03 SURGERY — PHACOEMULSIFICATION, CATARACT, WITH IOL INSERTION
Anesthesia: Monitor Anesthesia Care | Site: Eye | Laterality: Left

## 2021-07-03 MED ORDER — SIGHTPATH DOSE#1 BSS IO SOLN
INTRAOCULAR | Status: DC | PRN
  Administered 2021-07-03: 15 mL

## 2021-07-03 MED ORDER — SIGHTPATH DOSE#1 BSS IO SOLN
INTRAOCULAR | Status: DC | PRN
  Administered 2021-07-03: 107 mL via OPHTHALMIC

## 2021-07-03 MED ORDER — MOXIFLOXACIN HCL 0.5 % OP SOLN
OPHTHALMIC | Status: DC | PRN
  Administered 2021-07-03: 0.2 mL via OPHTHALMIC

## 2021-07-03 MED ORDER — SIGHTPATH DOSE#1 SODIUM HYALURONATE 10 MG/ML IO SOLUTION
PREFILLED_SYRINGE | INTRAOCULAR | Status: DC | PRN
  Administered 2021-07-03: 0.85 mL via INTRAOCULAR

## 2021-07-03 MED ORDER — ARMC OPHTHALMIC DILATING DROPS
1.0000 | OPHTHALMIC | Status: DC | PRN
Start: 2021-07-03 — End: 2021-07-03
  Administered 2021-07-03 (×3): 1 via OPHTHALMIC

## 2021-07-03 MED ORDER — TETRACAINE HCL 0.5 % OP SOLN
1.0000 [drp] | OPHTHALMIC | Status: DC | PRN
  Administered 2021-07-03 (×3): 1 [drp] via OPHTHALMIC

## 2021-07-03 MED ORDER — FENTANYL CITRATE (PF) 100 MCG/2ML IJ SOLN
INTRAMUSCULAR | Status: DC | PRN
  Administered 2021-07-03 (×2): 50 ug via INTRAVENOUS

## 2021-07-03 MED ORDER — ACETAMINOPHEN 325 MG PO TABS
650.0000 mg | ORAL_TABLET | Freq: Once | ORAL | Status: AC
Start: 2021-07-03 — End: 2021-07-03
  Administered 2021-07-03: 650 mg via ORAL

## 2021-07-03 MED ORDER — LIDOCAINE HCL (PF) 2 % IJ SOLN
INTRAOCULAR | Status: DC | PRN
  Administered 2021-07-03: 1 mL via INTRAOCULAR

## 2021-07-03 MED ORDER — SIGHTPATH DOSE#1 SODIUM HYALURONATE 23 MG/ML IO SOLUTION
PREFILLED_SYRINGE | INTRAOCULAR | Status: DC | PRN
  Administered 2021-07-03: 0.6 mL via INTRAOCULAR

## 2021-07-03 MED ORDER — MIDAZOLAM HCL 2 MG/2ML IJ SOLN
INTRAMUSCULAR | Status: DC | PRN
  Administered 2021-07-03: 2 mg via INTRAVENOUS

## 2021-07-03 SURGICAL SUPPLY — 15 items
CATARACT SUITE SIGHTPATH (MISCELLANEOUS) ×2 IMPLANT
DISSECTOR HYDRO NUCLEUS 50X22 (MISCELLANEOUS) ×2 IMPLANT
FEE CATARACT SUITE SIGHTPATH (MISCELLANEOUS) ×1 IMPLANT
GLOVE SURG GAMMEX PI TX LF 7.5 (GLOVE) ×2 IMPLANT
GLOVE SURG SYN 8.5  E (GLOVE) ×1
GLOVE SURG SYN 8.5 E (GLOVE) ×1 IMPLANT
GLOVE SURG SYN 8.5 PF PI (GLOVE) ×1 IMPLANT
LENS IOL ACRSF VT TRC 515 14.0 IMPLANT
LENS IOL ACRYSOF VIVITY TORIC ×2 IMPLANT
NDL FILTER BLUNT 18X1 1/2 (NEEDLE) ×1 IMPLANT
NEEDLE FILTER BLUNT 18X 1/2SAF (NEEDLE) ×1
NEEDLE FILTER BLUNT 18X1 1/2 (NEEDLE) ×1 IMPLANT
SYR 3ML LL SCALE MARK (SYRINGE) ×2 IMPLANT
SYR 5ML LL (SYRINGE) ×2 IMPLANT
WATER STERILE IRR 250ML POUR (IV SOLUTION) ×2 IMPLANT

## 2021-07-03 NOTE — Anesthesia Preprocedure Evaluation (Addendum)
Anesthesia Evaluation  ?Patient identified by MRN, date of birth, ID band ?Patient awake ? ? ? ?Airway ?Mallampati: III ? ?TM Distance: >3 FB ?Neck ROM: Full ? ? ? Dental ?no notable dental hx. ? ?  ?Pulmonary ?sleep apnea and Continuous Positive Airway Pressure Ventilation , former smoker,  ?  ?Pulmonary exam normal ? ? ? ? ? ? ? Cardiovascular ?Exercise Tolerance: Good ?hypertension, + CAD (based on CTA) and +CHF (hx EF 40% but recovered to normal )  ?Normal cardiovascular exam ? ? ?  ?Neuro/Psych ?negative neurological ROS ?   ? GI/Hepatic ?GERD  Medicated,  ?Endo/Other  ?Hypothyroidism  ? Renal/GU ?  ? ?  ?Musculoskeletal ? ? Abdominal ?  ?Peds ? Hematology ?  ?Anesthesia Other Findings ? ? Reproductive/Obstetrics ? ?  ? ? ? ? ? ? ? ? ? ? ? ? ? ?  ?  ? ? ? ? ? ? ? ?Anesthesia Physical ?Anesthesia Plan ? ?ASA: 2 ? ?Anesthesia Plan: MAC  ? ?Post-op Pain Management: Minimal or no pain anticipated  ? ?Induction:  ? ?PONV Risk Score and Plan: 1 and TIVA, Midazolam and Treatment may vary due to age or medical condition ? ?Airway Management Planned: Nasal Cannula and Natural Airway ? ?Additional Equipment: None ? ?Intra-op Plan:  ? ?Post-operative Plan:  ? ?Informed Consent: I have reviewed the patients History and Physical, chart, labs and discussed the procedure including the risks, benefits and alternatives for the proposed anesthesia with the patient or authorized representative who has indicated his/her understanding and acceptance.  ? ? ? ? ? ?Plan Discussed with: CRNA ? ?Anesthesia Plan Comments:   ? ? ? ? ? ? ?Anesthesia Quick Evaluation ? ?

## 2021-07-03 NOTE — Anesthesia Procedure Notes (Signed)
Procedure Name: [place] ?Date/Time: 07/03/2021 8:40 AM ?Performed by: Jeannene Patella, CRNA ?Pre-anesthesia Checklist: Patient identified, Emergency Drugs available, Suction available, Timeout performed and Patient being monitored ?Patient Re-evaluated:Patient Re-evaluated prior to induction ?Oxygen Delivery Method: Nasal cannula ?Placement Confirmation: positive ETCO2 ? ? ? ? ?

## 2021-07-03 NOTE — H&P (Signed)
[place]  ? ?Primary Care Physician:  Rusty Aus, MD ?Ophthalmologist: Dr. Benay Pillow ? ?Pre-Procedure History & Physical: ?HPI:  Chad Gaines is a 76 y.o. male here for cataract surgery. ?  ?Past Medical History:  ?Diagnosis Date  ? Allergy to alpha-gal   ? GERD (gastroesophageal reflux disease)   ? Hypertension   ? Pinched nerve   ? some numbness R arm, R ankle  ? Sleep apnea   ? CPAP  ? ? ?Past Surgical History:  ?Procedure Laterality Date  ? APPENDECTOMY    ? NASAL SEPTUM SURGERY    ? ? ?Prior to Admission medications   ?Medication Sig Start Date End Date Taking? Authorizing Provider  ?etodolac (LODINE) 400 MG tablet Take by mouth. 03/08/14  Yes [provider]  ?levothyroxine (SYNTHROID, LEVOTHROID) 125 MCG tablet Take by mouth. 03/08/14  Yes [provider]  ?losartan (COZAAR) 50 MG tablet Take 50 mg by mouth daily.   Yes [provider]  ?Multiple Vitamin (MULTIVITAMIN PO) Take by mouth.   Yes [provider]  ?pantoprazole (PROTONIX) 40 MG tablet Take 40 mg by mouth daily.   Yes [provider]  ?rosuvastatin (CRESTOR) 5 MG tablet Take 5 mg by mouth daily.   Yes [provider]  ?zolpidem (AMBIEN) 10 MG tablet TAKE ONE TABLET BY MOUTH AT BEDTIME 11/17/14  Yes [provider]  ? ? ?Allergies as of 06/08/2021 - Review Complete 05/16/2015  ?Allergen Reaction Noted  ? Sertraline hcl Diarrhea 12/23/2014  ? ? ?History reviewed. No pertinent family history. ? ?Social History  ? ?Socioeconomic History  ? Marital status: Married  ?  Spouse name: Not on file  ? Number of children: Not on file  ? Years of education: Not on file  ? Highest education level: Not on file  ?Occupational History  ? Not on file  ?Tobacco Use  ? Smoking status: Former  ? Smokeless tobacco: Never  ? Tobacco comments:  ?  As teenager  ?Vaping Use  ? Vaping Use: Never used  ?Substance and Sexual Activity  ? Alcohol use: Not Currently  ? Drug use: Not on file  ? Sexual  activity: Not on file  ?Other Topics Concern  ? Not on file  ?Social History Narrative  ? Not on file  ? ?Social Determinants of Health  ? ?Financial Resource Strain: Not on file  ?Food Insecurity: Not on file  ?Transportation Needs: Not on file  ?Physical Activity: Not on file  ?Stress: Not on file  ?Social Connections: Not on file  ?Intimate Partner Violence: Not on file  ? ? ?Review of Systems: ?See HPI, otherwise negative ROS ? ?Physical Exam: ?BP (!) 139/91   Pulse (!) 56   Temp (!) 97.3 ?F (36.3 ?C) (Temporal)   Resp 16   Ht '5\' 10"'$  (1.778 m)   Wt 88.5 kg   SpO2 96%   BMI 27.98 kg/m?  ?General:   Alert, cooperative in NAD ?Head:  Normocephalic and atraumatic. ?Respiratory:  Normal work of breathing. ?Cardiovascular:  RRR ? ?Impression/Plan: ?Chad Gaines is here for cataract surgery. ? ?Risks, benefits, limitations, and alternatives regarding cataract surgery have been reviewed with the patient.  Questions have been answered.  All parties agreeable. ? ? ?Benay Pillow, MD  07/03/2021, 8:31 AM ? ? ?

## 2021-07-03 NOTE — Op Note (Signed)
OPERATIVE NOTE ? ?Izick Gasbarro ?[id number] ?07/03/2021 ? ? ?PREOPERATIVE DIAGNOSIS:  Nuclear sclerotic cataract left eye.  H25.12 ?  ?POSTOPERATIVE DIAGNOSIS:    Nuclear sclerotic cataract left eye.   ?  ?PROCEDURE:  Phacoemusification with posterior chamber intraocular lens placement of the left eye  ? ?LENS:   ?Implant Name Type Inv. Item Serial No. Manufacturer Lot No. LRB No. Used Action  ?ACRYSOF IQ VIVITY TORIC IOL Intraocular Lens  [id number]   Left 1 Implanted  ?    ?Procedure(s) with comments: ?CATARACT EXTRACTION PHACO AND INTRAOCULAR LENS PLACEMENT (IOC) LEFT VIVITY TORIC 3.59 00:27.8 (Left) - sleep apnea ? ?FFM384 +14.0 ?  ?ULTRASOUND TIME: 0 minutes 27 seconds.  CDE 3.59 ?  ?SURGEON:  Benay Pillow, MD, MPH ?  ?ANESTHESIA:  Topical with tetracaine drops augmented with 1% preservative-free intracameral lidocaine. ? ?ESTIMATED BLOOD LOSS: <1 mL ?  ?COMPLICATIONS:  None. ?  ?DESCRIPTION OF PROCEDURE:  The patient was identified in the holding room and transported to the operating room and placed in the supine position under the operating microscope.  The left eye was identified as the operative eye and it was prepped and draped in the usual sterile ophthalmic fashion. ?  ?A 1.0 millimeter clear-corneal paracentesis was made at the 5:00 position. 0.5 ml of preservative-free 1% lidocaine with epinephrine was injected into the anterior chamber. ? The anterior chamber was filled with Healon 5 viscoelastic.  A 2.4 millimeter keratome was used to make a near-clear corneal incision at the 2:00 position.  A curvilinear capsulorrhexis was made with a cystotome and capsulorrhexis forceps.  Balanced salt solution was used to hydrodissect and hydrodelineate the nucleus. ?  ?Phacoemulsification was then used in stop and chop fashion to remove the lens nucleus and epinucleus.  The remaining cortex was then removed using the irrigation and aspiration handpiece. Healon was then placed into the capsular bag to distend it  for lens placement.  A lens was then injected into the capsular bag.  The remaining viscoelastic was aspirated. ?  ?Wounds were hydrated with balanced salt solution.  The anterior chamber was inflated to a physiologic pressure with balanced salt solution. ? ?Intracameral vigamox 0.1 mL undiltued was injected into the eye and a drop placed onto the ocular surface. ?No wound leaks were noted.  The patient was taken to the recovery room in stable condition without complications of anesthesia or surgery ? ?Benay Pillow ?07/03/2021, 9:13 AM ? ?

## 2021-07-03 NOTE — Anesthesia Postprocedure Evaluation (Signed)
Anesthesia Post Note ? ?Patient: Altan Kraai ? ?Procedure(s) Performed: CATARACT EXTRACTION PHACO AND INTRAOCULAR LENS PLACEMENT (IOC) LEFT VIVITY TORIC 3.59 00:27.8 (Left: Eye) ? ? ?  ?Patient location during evaluation: PACU ?Anesthesia Type: MAC ?Level of consciousness: awake and alert ?Pain management: pain level controlled ?Vital Signs Assessment: post-procedure vital signs reviewed and stable ?Respiratory status: spontaneous breathing ?Cardiovascular status: blood pressure returned to baseline ?Postop Assessment: no apparent nausea or vomiting, adequate PO intake and no headache ?Anesthetic complications: no ? ? ?No notable events documented. ? ?Adele Barthel Weslee Prestage ? ? ? ? ? ?

## 2021-07-03 NOTE — Transfer of Care (Signed)
Immediate Anesthesia Transfer of Care Note ? ?Patient: Chad Gaines ? ?Procedure(s) Performed: CATARACT EXTRACTION PHACO AND INTRAOCULAR LENS PLACEMENT (IOC) LEFT VIVITY TORIC 3.59 00:27.8 (Left: Eye) ? ?Patient Location: PACU ? ?Anesthesia Type: MAC ? ?Level of Consciousness: awake, alert  and patient cooperative ? ?Airway and Oxygen Therapy: Patient Spontanous Breathing and Patient connected to supplemental oxygen ? ?Post-op Assessment: Post-op Vital signs reviewed, Patient's Cardiovascular Status Stable, Respiratory Function Stable, Patent Airway and No signs of Nausea or vomiting ? ?Post-op Vital Signs: Reviewed and stable ? ?Complications: No notable events documented. ? ?

## 2021-07-04 ENCOUNTER — Encounter: Payer: Self-pay | Admitting: Ophthalmology

## 2021-07-10 ENCOUNTER — Encounter: Payer: Self-pay | Admitting: Ophthalmology

## 2021-07-12 NOTE — Discharge Instructions (Signed)

## 2021-07-17 ENCOUNTER — Ambulatory Visit: Payer: Medicare Other | Admitting: Anesthesiology

## 2021-07-17 ENCOUNTER — Encounter
Admission: RE | Disposition: A | Discharge status: Home / Self Care | Payer: Self-pay | Source: Home / Self Care | Attending: Ophthalmology

## 2021-07-17 ENCOUNTER — Ambulatory Visit
Admission: RE | Admit: 2021-07-17 | Discharge: 2021-07-17 | Disposition: A | Discharge status: Home / Self Care | Payer: Medicare Other | Attending: Ophthalmology | Admitting: Ophthalmology

## 2021-07-17 ENCOUNTER — Other Ambulatory Visit: Payer: Self-pay

## 2021-07-17 ENCOUNTER — Encounter: Payer: Self-pay | Admitting: Ophthalmology

## 2021-07-17 DIAGNOSIS — E039 Hypothyroidism, unspecified: Secondary | ICD-10-CM | POA: Insufficient documentation

## 2021-07-17 DIAGNOSIS — K219 Gastro-esophageal reflux disease without esophagitis: Secondary | ICD-10-CM | POA: Insufficient documentation

## 2021-07-17 DIAGNOSIS — H2511 Age-related nuclear cataract, right eye: Secondary | ICD-10-CM | POA: Insufficient documentation

## 2021-07-17 DIAGNOSIS — G4730 Sleep apnea, unspecified: Secondary | ICD-10-CM | POA: Insufficient documentation

## 2021-07-17 DIAGNOSIS — I110 Hypertensive heart disease with heart failure: Secondary | ICD-10-CM | POA: Insufficient documentation

## 2021-07-17 DIAGNOSIS — I2510 Atherosclerotic heart disease of native coronary artery without angina pectoris: Secondary | ICD-10-CM | POA: Insufficient documentation

## 2021-07-17 DIAGNOSIS — I509 Heart failure, unspecified: Secondary | ICD-10-CM | POA: Insufficient documentation

## 2021-07-17 HISTORY — PX: CATARACT EXTRACTION W/PHACO: SHX586

## 2021-07-17 SURGERY — PHACOEMULSIFICATION, CATARACT, WITH IOL INSERTION
Anesthesia: Monitor Anesthesia Care | Site: Eye | Laterality: Right

## 2021-07-17 MED ORDER — SIGHTPATH DOSE#1 BSS IO SOLN
INTRAOCULAR | Status: DC | PRN
  Administered 2021-07-17: 77 mL via OPHTHALMIC

## 2021-07-17 MED ORDER — SIGHTPATH DOSE#1 SODIUM HYALURONATE 10 MG/ML IO SOLUTION
PREFILLED_SYRINGE | INTRAOCULAR | Status: DC | PRN
  Administered 2021-07-17: 0.85 mL via INTRAOCULAR

## 2021-07-17 MED ORDER — SIGHTPATH DOSE#1 SODIUM HYALURONATE 23 MG/ML IO SOLUTION
PREFILLED_SYRINGE | INTRAOCULAR | Status: DC | PRN
  Administered 2021-07-17: 0.6 mL via INTRAOCULAR

## 2021-07-17 MED ORDER — SIGHTPATH DOSE#1 BSS IO SOLN
INTRAOCULAR | Status: DC | PRN
  Administered 2021-07-17: 15 mL

## 2021-07-17 MED ORDER — LACTATED RINGERS IV SOLN: INTRAVENOUS | Status: DC

## 2021-07-17 MED ORDER — TETRACAINE HCL 0.5 % OP SOLN
1.0000 [drp] | OPHTHALMIC | Status: DC | PRN
  Administered 2021-07-17 (×3): 1 [drp] via OPHTHALMIC

## 2021-07-17 MED ORDER — FENTANYL CITRATE (PF) 100 MCG/2ML IJ SOLN
INTRAMUSCULAR | Status: DC | PRN
  Administered 2021-07-17: 100 ug via INTRAVENOUS

## 2021-07-17 MED ORDER — MOXIFLOXACIN HCL 0.5 % OP SOLN
OPHTHALMIC | Status: DC | PRN
Start: 2021-07-17 — End: 2021-07-17
  Administered 2021-07-17: 0.2 mL via OPHTHALMIC

## 2021-07-17 MED ORDER — ARMC OPHTHALMIC DILATING DROPS
1.0000 "application " | OPHTHALMIC | Status: DC | PRN
  Administered 2021-07-17 (×3): 1 via OPHTHALMIC

## 2021-07-17 MED ORDER — LIDOCAINE HCL (PF) 2 % IJ SOLN
INTRAOCULAR | Status: DC | PRN
  Administered 2021-07-17: 1 mL via INTRAOCULAR

## 2021-07-17 MED ORDER — MIDAZOLAM HCL 2 MG/2ML IJ SOLN
INTRAMUSCULAR | Status: DC | PRN
  Administered 2021-07-17: 2 mg via INTRAVENOUS

## 2021-07-17 MED ORDER — ONDANSETRON HCL 4 MG/2ML IJ SOLN: 4.0000 mg | Freq: Once | INTRAMUSCULAR | Status: DC | PRN

## 2021-07-17 SURGICAL SUPPLY — 16 items
CATARACT SUITE SIGHTPATH (MISCELLANEOUS) ×2 IMPLANT
DISSECTOR HYDRO NUCLEUS 50X22 (MISCELLANEOUS) ×2 IMPLANT
FEE CATARACT SUITE SIGHTPATH (MISCELLANEOUS) ×1 IMPLANT
GLOVE SURG GAMMEX PI TX LF 7.5 (GLOVE) ×2 IMPLANT
GLOVE SURG SYN 8.5  E (GLOVE) ×1
GLOVE SURG SYN 8.5 E (GLOVE) ×1 IMPLANT
GLOVE SURG SYN 8.5 PF PI (GLOVE) ×1 IMPLANT
LENS IOL ACRSF VT TRC 515 15.0 IMPLANT
LENS IOL ACRYSOF VIVITY 15.0 ×2 IMPLANT
LENS IOL VIVITY 515 15.0 ×1 IMPLANT
NDL FILTER BLUNT 18X1 1/2 (NEEDLE) ×1 IMPLANT
NEEDLE FILTER BLUNT 18X 1/2SAF (NEEDLE) ×1
NEEDLE FILTER BLUNT 18X1 1/2 (NEEDLE) ×1 IMPLANT
SYR 3ML LL SCALE MARK (SYRINGE) ×2 IMPLANT
SYR 5ML LL (SYRINGE) ×2 IMPLANT
WATER STERILE IRR 250ML POUR (IV SOLUTION) ×2 IMPLANT

## 2021-07-17 NOTE — Anesthesia Postprocedure Evaluation (Signed)
Anesthesia Post Note ? ?Patient: Goebel Hellums ? ?Procedure(s) Performed: CATARACT EXTRACTION PHACO AND INTRAOCULAR LENS PLACEMENT (IOC) RIGHT VIVITY TORIC 4.20 00:33.2 (Right: Eye) ? ? ?  ?Patient location during evaluation: PACU ?Anesthesia Type: MAC ?Level of consciousness: awake and alert ?Pain management: pain level controlled ?Vital Signs Assessment: post-procedure vital signs reviewed and stable ?Respiratory status: spontaneous breathing, nonlabored ventilation and respiratory function stable ?Cardiovascular status: stable and blood pressure returned to baseline ?Postop Assessment: no apparent nausea or vomiting ?Anesthetic complications: no ? ? ?No notable events documented. ? ?April Manson ? ? ? ? ? ?

## 2021-07-17 NOTE — Anesthesia Preprocedure Evaluation (Signed)
Anesthesia Evaluation  ?Patient identified by MRN, date of birth, ID band ?Patient awake ? ? ? ?Airway ?Mallampati: III ? ?TM Distance: >3 FB ?Neck ROM: Full ? ? ? Dental ?no notable dental hx. ? ?  ?Pulmonary ?sleep apnea and Continuous Positive Airway Pressure Ventilation , former smoker,  ?  ?Pulmonary exam normal ? ? ? ? ? ? ? Cardiovascular ?Exercise Tolerance: Good ?hypertension, + CAD (based on CTA) and +CHF (hx EF 40% but recovered to normal )  ?Normal cardiovascular exam ? ? ?  ?Neuro/Psych ?negative neurological ROS ?   ? GI/Hepatic ?GERD  Medicated,  ?Endo/Other  ?Hypothyroidism  ? Renal/GU ?  ? ?  ?Musculoskeletal ? ? Abdominal ?  ?Peds ? Hematology ?  ?Anesthesia Other Findings ? ? Reproductive/Obstetrics ? ?  ? ? ? ? ? ? ? ? ? ? ? ? ? ?  ?  ? ? ? ? ? ? ? ? ?Anesthesia Physical ? ?Anesthesia Plan ? ?ASA: 2 ? ?Anesthesia Plan: MAC  ? ?Post-op Pain Management: Minimal or no pain anticipated  ? ?Induction:  ? ?PONV Risk Score and Plan: 1 and TIVA, Midazolam and Treatment may vary due to age or medical condition ? ?Airway Management Planned: Nasal Cannula and Natural Airway ? ?Additional Equipment: None ? ?Intra-op Plan:  ? ?Post-operative Plan:  ? ?Informed Consent: I have reviewed the patients History and Physical, chart, labs and discussed the procedure including the risks, benefits and alternatives for the proposed anesthesia with the patient or authorized representative who has indicated his/her understanding and acceptance.  ? ? ? ? ? ?Plan Discussed with: CRNA ? ?Anesthesia Plan Comments:   ? ? ? ? ? ? ?Anesthesia Quick Evaluation ? ?

## 2021-07-17 NOTE — Op Note (Signed)
OPERATIVE NOTE ? ?Chad Gaines ?[id number] ?07/17/2021 ? ? ?PREOPERATIVE DIAGNOSIS:  Nuclear sclerotic cataract right eye.  H25.11 ?  ?POSTOPERATIVE DIAGNOSIS:    Nuclear sclerotic cataract right eye.   ?  ?PROCEDURE:  Phacoemusification with posterior chamber intraocular lens placement of the right eye  ? ?LENS:   ?Implant Name Type Inv. Item Serial No. Manufacturer Lot No. LRB No. Used Action  ?LENS IOL ACRYSOF VIVITY 15.0 - [id number]  LENS IOL ACRYSOF VIVITY 15.0 [id number] [facility]  Right 1 Implanted  ?    ? ?Procedure(s) with comments: ?CATARACT EXTRACTION PHACO AND INTRAOCULAR LENS PLACEMENT (IOC) RIGHT VIVITY TORIC 4.20 00:33.2 (Right) - sleep apnea ? ?[id number] +15.0 at 005 degrees ?  ?ULTRASOUND TIME: 0 minutes 33 seconds.  CDE 4.20 ?  ?SURGEON:  Benay Pillow, MD, MPH ? ?ANESTHESIOLOGIST: Anesthesiologist: April Manson, MD ?CRNA: Silvana Newness, CRNA ?  ?ANESTHESIA:  Topical with tetracaine drops augmented with 1% preservative-free intracameral lidocaine. ? ?ESTIMATED BLOOD LOSS: less than 1 mL. ?  ?COMPLICATIONS:  None. ?  ?DESCRIPTION OF PROCEDURE:  The patient was identified in the holding room and transported to the operating room and placed in the supine position under the operating microscope.  The right eye was identified as the operative eye and it was prepped and draped in the usual sterile ophthalmic fashion. ? ?The verion system was registered without difficulty. ?  ?A 1.0 millimeter clear-corneal paracentesis was made at the 10:30 position. 0.5 ml of preservative-free 1% lidocaine with epinephrine was injected into the anterior chamber. ? The anterior chamber was filled with Healon 5 viscoelastic.  A 2.4 millimeter keratome was used to make a near-clear corneal incision at the 8:00 position.  A curvilinear capsulorrhexis was made with a cystotome and capsulorrhexis forceps.  Balanced salt solution was used to hydrodissect and hydrodelineate the nucleus. ?  ?Phacoemulsification was then used  in stop and chop fashion to remove the lens nucleus and epinucleus.  The remaining cortex was then removed using the irrigation and aspiration handpiece. Healon was then placed into the capsular bag to distend it for lens placement.  A lens was then injected into the capsular bag.  The remaining viscoelastic was aspirated. ? ?The lens was rotated with guidance from the [place] system. ?  ?Wounds were hydrated with balanced salt solution.  The anterior chamber was inflated to a physiologic pressure with balanced salt solution. The lens was well centered. ? ?The cornea was swollen with some peripheral bullae. ? ?Intracameral vigamox 0.1 mL undiluted was injected into the eye and a drop placed onto the ocular surface. ? ?No wound leaks were noted.  The patient was taken to the recovery room in stable condition without complications of anesthesia or surgery ? ?Benay Pillow ?07/17/2021, 12:26 PM ? ?

## 2021-07-17 NOTE — Transfer of Care (Signed)
Immediate Anesthesia Transfer of Care Note ? ?Patient: Chad Gaines ? ?Procedure(s) Performed: CATARACT EXTRACTION PHACO AND INTRAOCULAR LENS PLACEMENT (IOC) RIGHT VIVITY TORIC 4.20 00:33.2 (Right: Eye) ? ?Patient Location: PACU ? ?Anesthesia Type: MAC ? ?Level of Consciousness: awake, alert  and patient cooperative ? ?Airway and Oxygen Therapy: Patient Spontanous Breathing and Patient connected to supplemental oxygen ? ?Post-op Assessment: Post-op Vital signs reviewed, Patient's Cardiovascular Status Stable, Respiratory Function Stable, Patent Airway and No signs of Nausea or vomiting ? ?Post-op Vital Signs: Reviewed and stable ? ?Complications: No notable events documented. ? ?

## 2021-07-17 NOTE — H&P (Signed)
[facility]  ? ?Primary Care Physician:  Rusty Aus, MD ?Ophthalmologist: Dr. Benay Pillow ? ?Pre-Procedure History & Physical: ?HPI:  Chad Gaines is a 76 y.o. male here for cataract surgery. ?  ?Past Medical History:  ?Diagnosis Date  ? Allergy to alpha-gal   ? GERD (gastroesophageal reflux disease)   ? Hypertension   ? Pinched nerve   ? some numbness R arm, R ankle  ? Sleep apnea   ? CPAP  ? ? ?Past Surgical History:  ?Procedure Laterality Date  ? APPENDECTOMY    ? CATARACT EXTRACTION W/PHACO Left 07/03/2021  ? Procedure: CATARACT EXTRACTION PHACO AND INTRAOCULAR LENS PLACEMENT (IOC) LEFT VIVITY TORIC 3.59 00:27.8;  Surgeon: Eulogio Bear, MD;  Location: [facility];  Service: Ophthalmology;  Laterality: Left;  sleep apnea  ? NASAL SEPTUM SURGERY    ? ? ?Prior to Admission medications   ?Medication Sig Start Date End Date Taking? Authorizing Provider  ?etodolac (LODINE) 400 MG tablet Take by mouth. 03/08/14  Yes [provider]  ?levothyroxine (SYNTHROID, LEVOTHROID) 125 MCG tablet Take by mouth. 03/08/14  Yes [provider]  ?losartan (COZAAR) 50 MG tablet Take 50 mg by mouth daily.   Yes [provider]  ?Multiple Vitamin (MULTIVITAMIN PO) Take by mouth.   Yes [provider]  ?pantoprazole (PROTONIX) 40 MG tablet Take 40 mg by mouth daily.   Yes [provider]  ?rosuvastatin (CRESTOR) 5 MG tablet Take 5 mg by mouth daily.   Yes [provider]  ?zolpidem (AMBIEN) 10 MG tablet TAKE ONE TABLET BY MOUTH AT BEDTIME 11/17/14  Yes [provider]  ? ? ?Allergies as of 06/08/2021 - Review Complete 05/16/2015  ?Allergen Reaction Noted  ? Sertraline hcl Diarrhea 12/23/2014  ? ? ?History reviewed. No pertinent family history. ? ?Social History  ? ?Socioeconomic History  ? Marital status: Married  ?  Spouse name: Not on file  ? Number of children: Not on file  ? Years of education: Not on file  ? Highest education level: Not on file   ?Occupational History  ? Not on file  ?Tobacco Use  ? Smoking status: Former  ? Smokeless tobacco: Never  ? Tobacco comments:  ?  As teenager  ?Vaping Use  ? Vaping Use: Never used  ?Substance and Sexual Activity  ? Alcohol use: Not Currently  ? Drug use: Not on file  ? Sexual activity: Not on file  ?Other Topics Concern  ? Not on file  ?Social History Narrative  ? Not on file  ? ?Social Determinants of Health  ? ?Financial Resource Strain: Not on file  ?Food Insecurity: Not on file  ?Transportation Needs: Not on file  ?Physical Activity: Not on file  ?Stress: Not on file  ?Social Connections: Not on file  ?Intimate Partner Violence: Not on file  ? ? ?Review of Systems: ?See HPI, otherwise negative ROS ? ?Physical Exam: ?BP 133/70   Pulse (!) 54   Temp 97.9 ?F (36.6 ?C) (Temporal)   Resp 14   Ht '5\' 10"'$  (1.778 m)   Wt 89.8 kg   SpO2 95%   BMI 28.41 kg/m?  ?General:   Alert, cooperative in NAD ?Head:  Normocephalic and atraumatic. ?Respiratory:  Normal work of breathing. ?Cardiovascular:  RRR ? ?Impression/Plan: ?Chad Gaines is here for cataract surgery. ? ?Risks, benefits, limitations, and alternatives regarding cataract surgery have been reviewed with the patient.  Questions have been answered.  All parties agreeable. ? ? ?  Benay Pillow, MD  07/17/2021, 11:54 AM ? ? ?

## 2021-07-18 ENCOUNTER — Encounter: Payer: Self-pay | Admitting: Ophthalmology

## 2022-03-28 ENCOUNTER — Other Ambulatory Visit: Payer: Self-pay | Admitting: Internal Medicine

## 2022-03-28 DIAGNOSIS — G959 Disease of spinal cord, unspecified: Secondary | ICD-10-CM

## 2022-03-29 ENCOUNTER — Ambulatory Visit: Payer: Medicare Other | Attending: Internal Medicine

## 2022-03-29 DIAGNOSIS — M25551 Pain in right hip: Secondary | ICD-10-CM | POA: Diagnosis present

## 2022-03-29 NOTE — Therapy (Unsigned)
OUTPATIENT PHYSICAL THERAPY THORACOLUMBAR EVALUATION  Patient Name: Macallan Ord MRN: [id number] DOB:June 04, 1945, 76 y.o., male Today's Date: 04/02/2022  END OF SESSION:  PT End of Session - 04/01/22 2031     Visit Number 1    Number of Visits 17    Date for PT Re-Evaluation 05/27/22    Authorization Type eval: 03/29/22    PT Start Time 1015    PT Stop Time 1100    PT Time Calculation (min) 45 min    Activity Tolerance Patient tolerated treatment well    Behavior During Therapy WFL for tasks assessed/performed            Past Medical History:  Diagnosis Date   Allergy to alpha-gal    GERD (gastroesophageal reflux disease)    Hypertension    Pinched nerve    some numbness R arm, R ankle   Sleep apnea    CPAP   Past Surgical History:  Procedure Laterality Date   APPENDECTOMY     CATARACT EXTRACTION W/PHACO Left 07/03/2021   Procedure: CATARACT EXTRACTION PHACO AND INTRAOCULAR LENS PLACEMENT (IOC) LEFT VIVITY TORIC 3.59 00:27.8;  Surgeon: Eulogio Bear, MD;  Location: [facility];  Service: Ophthalmology;  Laterality: Left;  sleep apnea   CATARACT EXTRACTION W/PHACO Right 07/17/2021   Procedure: CATARACT EXTRACTION PHACO AND INTRAOCULAR LENS PLACEMENT (IOC) RIGHT VIVITY TORIC 4.20 00:33.2;  Surgeon: Eulogio Bear, MD;  Location: [facility];  Service: Ophthalmology;  Laterality: Right;  sleep apnea   NASAL SEPTUM SURGERY     Patient Active Problem List   Diagnosis Date Noted   Adult hypothyroidism 12/25/2014   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   Apnea, sleep 12/25/2014   Essential (primary) hypertension 08/05/2014   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   PCP: Emily Filbert MD  REFERRING PROVIDER: Emily Filbert MD  REFERRING DIAG: M25.559 (ICD-10-CM) - Hip pain   RATIONALE FOR EVALUATION AND TREATMENT: Rehabilitation  THERAPY DIAG: Pain in right hip  ONSET DATE: Multiple  years  FOLLOW-UP APPT SCHEDULED WITH REFERRING PROVIDER: Yes    SUBJECTIVE:                                                                                                                                                                                         SUBJECTIVE STATEMENT:  R hip pain  PERTINENT HISTORY:  Pt reports R hip pain for multiple years with progressive worsening. Pain was initially in the anterior groin and posterior hip however the anterior hip pain has mostly resolved at this time with medication. He is now limping and walking with a  hiking pole. Pain worsens throughout the day. Pt reports progressive RLE weakness however he has not had any falls. Posterior hip pain has been nonresponsive to anti-inflammatories, oral steroids, gabapentin, and cortisone injections. Plain x-ray showed L5-S1 disc narrowing. Lumbar MRI ordered. "It has started to define my life." He used to like to walk and hike but hasn't been able to lately because of the pain. History of cervical pain and RUE radiculopathy.    PAIN:    Pain Intensity: Present: 0/10, Best: 0/10, Worst: 8/10 Pain location: Anterior and posterior R hip (mostly posterior) Pain Quality: aching posteriorly but becomes sharp with walking. Radiating: Yes, posterolateral RLE down to the ankle Numbness/Tingling: Yes, "prickly" down RLE; Focal Weakness: Yes Aggravating factors: walking, extended sitting, Relieving factors: static standing, changing positions, hasn't tried ice/heat 24-hour pain behavior: gradually worsens as the day progresses History of prior back injury, pain, surgery, or therapy: No Dominant hand: right Imaging: Yes, see history Red flags: Positive: History of cancer (CLL), Negative for bowel/bladder changes, saddle paresthesia, personal history of cancer, h/o spinal tumors, h/o compression fx, h/o abdominal aneurysm, abdominal pain, chills/fever, night sweats, nausea, vomiting, unrelenting pain, first onset of  insidious LBP <20 y/o  PRECAUTIONS: None  WEIGHT BEARING RESTRICTIONS: No  FALLS: Has patient fallen in last 6 months? 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   Hobbies: Reading  Patient Goals: "To walk normally." Pt would like to decrease his pain and be able to walk and hike    OBJECTIVE:  Patient Surveys  FOTO 38, predicted improvement to 26  Cognition Patient is oriented to person, place, and time.  Recent memory is intact.  Remote memory is intact.  Attention span and concentration are intact.  Expressive speech is intact.  Patient's fund of knowledge is within normal limits for educational level.    Gross Musculoskeletal Assessment Tremor: None Bulk: Normal Tone: Normal No visible step-off along spinal column, no signs of scoliosis  GAIT: Antalgic gait on the RLE. Pt ambulates with single hiking pole  Posture: Lumbar lordosis: WNL Iliac crest height: Equal bilaterally Lumbar lateral shift: Negative  AROM AROM (Normal range in degrees) AROM   Lumbar   Flexion (65) Moderate segmental loss, no pain  Extension (30) Moderate to severe loss of extension, anterior R hip pain  Right lateral flexion (25) Moderate loss, anterior R hip pain  Left lateral flexion (25) Moderate loss, no pain  Right rotation (30) Min loss, no pain  Left rotation (30) Min loss, no pain      Hip Right Left  Flexion (125) Limited by pain around 90 degrees  WNL  Extension (15)    Abduction (40)    Adduction     Internal Rotation (45) To be measured at next session   External Rotation (45) To be measured at next session       Knee    Flexion (135) WNL WNL  Extension (0) WNL WNL       Ankle    Dorsiflexion (20)    Plantarflexion (50)    Inversion (35)    Eversion (15)    (* = pain; Blank rows = not tested)  LE MMT: MMT (out of 5) Right  Left   Hip flexion 4* 5  Hip extension    Hip abduction (seated) 5* 5  Hip adduction (seated) 5* 5  Hip internal rotation 5* 5  Hip external rotation 5* 5  Knee flexion (seated) 4+* 5  Knee extension 5 5  Ankle dorsiflexion 5 5  Ankle plantarflexion Strong Strong  Ankle inversion    Ankle eversion    (* = pain; Blank rows = not tested)  Sensation Decreased sensation to light touch RLE L5-S1. Otherwise WNL BLE. Proprioception, stereognosis, and hot/cold testing deferred on this date.  Reflexes R/L Knee Jerk (L3/4): 1+/1+  Ankle Jerk (S1/2): 1+/1+   Muscle Length Hamstrings: R: Positive for tightness at 52 degrees L: Positive for tightness at 50 degrees [place] (quadriceps): R: Not examined L: Not examined Thomas (hip flexors): R: Not examined L: Not examined [place]: R: Not examined L: Not examined  Palpation Location Right Left         Lumbar paraspinals    Quadratus Lumborum    Gluteus Maximus 1   Gluteus Medius 1   Deep hip external rotators 1   PSIS 1   Fortin's Area (SIJ) 1   Greater Trochanter 1   (Blank rows = not tested) Graded on 0-4 scale (0 = no pain, 1 = pain, 2 = pain with wincing/grimacing/flinching, 3 = pain with withdrawal, 4 = unwilling to allow palpation)  Passive Accessory Intervertebral Motion Deferred  Special Tests Lumbar Radiculopathy and Discogenic: Centralization and Peripheralization (SN 92, -LR 0.12): Negative, worsening hip pain but symptoms do not centralize or peripheralize Slump (SN 83, -LR 0.32): R: Negative L: Negative SLR (SN 92, -LR 0.29): R: Negative L:  Negative Crossed SLR (SP 90): R: Negative L: Negative  Facet Joint: Extension-Rotation (SN 100, -LR 0.0): R: Negative L: Negative  Lumbar Foraminal Stenosis: Lumbar quadrant (SN 70): R: Negative L:  Negative  Hip: FABER (SN 81): R: Positive L: Negative FADIR (SN 94): R: Positive L: Negative Hip scour (SN 50): R: Negative L: Negative  SIJ:  Thigh Thrust (SN 88, -LR 0.18) : R: Not examined L: Not examined  Piriformis Syndrome: FAIR Test (SN 88, SP 83): R: Not examined L: Not examined  Functional Tasks Deferred  Beighton scale:  Deferred   TODAY'S TREATMENT: Deferred    PATIENT EDUCATION:  Education details: Plan of care Person educated: Patient Education method: Explanation Education comprehension: verbalized understanding   HOME EXERCISE PROGRAM:  Deferred to next session   ASSESSMENT:  CLINICAL IMPRESSION: Patient is a 76 y.o. male who was seen today for physical therapy evaluation and treatment for R hip pain. Findings of exam concerning for possible lumbar radiculopathy. Pt also with isolated pain in R hip with active and PROM indicating probable independent hip involvement as well.   OBJECTIVE IMPAIRMENTS: Abnormal gait, decreased ROM, decreased strength, and pain.   ACTIVITY LIMITATIONS: bending, sitting, and squatting  PARTICIPATION LIMITATIONS: community activity  PERSONAL FACTORS: 3+ comorbidities: CLL, OSA, OA, and neuropathy  are also affecting patient's functional outcome.   REHAB POTENTIAL: Good  CLINICAL DECISION MAKING: Evolving/moderate complexity  EVALUATION COMPLEXITY: Low   GOALS: Goals reviewed with patient? No  SHORT TERM GOALS: Target date: 04/30/2022  Pt will be independent with HEP in order to improve strength and decrease hip pain to improve pain-free function at home. Baseline:  Goal status: INITIAL   LONG TERM GOALS: Target date: 05/24/2022   Pt will increase FOTO to at least 61 to demonstrate significant improvement in function at home  and work related to back pain  Baseline: 03/29/22: 38 Goal status: INITIAL  2.  Pt will decrease worst hip pain by at least 3 points on the NPRS in order to demonstrate clinically  significant reduction in hip pain. Baseline: 03/29/22: worst: 8/10; Goal status: INITIAL  3.  Pt will decrease mODI score by at least 13 points in order demonstrate clinically significant reduction in back pain/disability.       Baseline: 03/29/22: To be completed Goal status: INITIAL  4.  Pt will report at least 75% improvement in his hip symptoms in order to return to walking and hiking without notable increase in his pain.  Baseline:  Goal status: INITIAL   PLAN: PT FREQUENCY: 1-2x/week  PT DURATION: 8 weeks  PLANNED INTERVENTIONS: Therapeutic exercises, Therapeutic activity, Neuromuscular re-education, Balance training, Gait training, Patient/Family education, Self Care, Joint mobilization, Joint manipulation, Vestibular training, Canalith repositioning, Orthotic/Fit training, DME instructions, Dry Needling, Electrical stimulation, Spinal manipulation, Spinal mobilization, Cryotherapy, Moist heat, Taping, Traction, Ultrasound, Ionotophoresis '4mg'$ /ml Dexamethasone, Manual therapy, and Re-evaluation.  PLAN FOR NEXT SESSION: mODI, measure R hip IR/ER, lumbar PAM testing, initiate manual therapy and strengthening for hip, issue HEP   Lyndel Safe Jadence Kinlaw PT, DPT, GCS  Camerin Ladouceur, PT 04/02/2022, 9:03 AM

## 2022-04-03 ENCOUNTER — Ambulatory Visit: Payer: Medicare Other

## 2022-04-03 DIAGNOSIS — M25551 Pain in right hip: Secondary | ICD-10-CM

## 2022-04-03 NOTE — Therapy (Unsigned)
OUTPATIENT PHYSICAL THERAPY THORACOLUMBAR/HIP TREATMENT  Patient Name: Chad Gaines MRN: [id number] DOB:1945/10/29, 76 y.o., male Today's Date: 04/04/2022  END OF SESSION:  PT End of Session - 04/03/22 1406     Visit Number 2    Number of Visits 17    Date for PT Re-Evaluation 05/27/22    Authorization Type eval: 03/29/22    PT Start Time 1405    PT Stop Time 1445    PT Time Calculation (min) 40 min    Activity Tolerance Patient tolerated treatment well    Behavior During Therapy WFL for tasks assessed/performed            Past Medical History:  Diagnosis Date   Allergy to alpha-gal    GERD (gastroesophageal reflux disease)    Hypertension    Pinched nerve    some numbness R arm, R ankle   Sleep apnea    CPAP   Past Surgical History:  Procedure Laterality Date   APPENDECTOMY     CATARACT EXTRACTION W/PHACO Left 07/03/2021   Procedure: CATARACT EXTRACTION PHACO AND INTRAOCULAR LENS PLACEMENT (IOC) LEFT VIVITY TORIC 3.59 00:27.8;  Surgeon: Eulogio Bear, MD;  Location: [facility];  Service: Ophthalmology;  Laterality: Left;  sleep apnea   CATARACT EXTRACTION W/PHACO Right 07/17/2021   Procedure: CATARACT EXTRACTION PHACO AND INTRAOCULAR LENS PLACEMENT (IOC) RIGHT VIVITY TORIC 4.20 00:33.2;  Surgeon: Eulogio Bear, MD;  Location: [facility];  Service: Ophthalmology;  Laterality: Right;  sleep apnea   NASAL SEPTUM SURGERY     Patient Active Problem List   Diagnosis Date Noted   Adult hypothyroidism 12/25/2014   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   Apnea, sleep 12/25/2014   Essential (primary) hypertension 08/05/2014   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[place]) 03/08/2014   Chronic lymphocytic leukemia ([place]) 03/08/2014   PCP: Emily Filbert MD  REFERRING PROVIDER: Emily Filbert MD  REFERRING DIAG: M25.559 (ICD-10-CM) - Hip pain   RATIONALE FOR EVALUATION AND TREATMENT: Rehabilitation  THERAPY DIAG: Pain in right hip  ONSET DATE: Multiple  years  FOLLOW-UP APPT SCHEDULED WITH REFERRING PROVIDER: Yes   FROM INITIAL EVALUATION SUBJECTIVE:                                                                                                                                                                                         SUBJECTIVE STATEMENT:  R hip pain  PERTINENT HISTORY:  Pt reports R hip pain for multiple years with progressive worsening. Pain was initially in the anterior groin and posterior hip however the anterior hip pain has mostly resolved at this time with medication. He is now limping and walking  with a hiking pole. Pain worsens throughout the day. Pt reports progressive RLE weakness however he has not had any falls. Posterior hip pain has been nonresponsive to anti-inflammatories, oral steroids, gabapentin, and cortisone injections. Plain x-ray showed L5-S1 disc narrowing. Lumbar MRI ordered. "It has started to define my life." He used to like to walk and hike but hasn't been able to lately because of the pain. History of cervical pain and RUE radiculopathy.    PAIN:    Pain Intensity: Present: 0/10, Best: 0/10, Worst: 8/10 Pain location: Anterior and posterior R hip (mostly posterior) Pain Quality: aching posteriorly but becomes sharp with walking. Radiating: Yes, posterolateral RLE down to the ankle Numbness/Tingling: Yes, "prickly" down RLE; Focal Weakness: Yes Aggravating factors: walking, extended sitting, Relieving factors: static standing, changing positions, hasn't tried ice/heat 24-hour pain behavior: gradually worsens as the day progresses History of prior back injury, pain, surgery, or therapy: No Dominant hand: right Imaging: Yes, see history Red flags: Positive: History of cancer (CLL), Negative for bowel/bladder changes, saddle paresthesia, personal history of cancer, h/o spinal tumors, h/o compression fx, h/o abdominal aneurysm, abdominal pain, chills/fever, night sweats, nausea, vomiting, unrelenting  pain, first onset of insidious LBP <21 y/o  PRECAUTIONS: None  WEIGHT BEARING RESTRICTIONS: No  FALLS: Has patient fallen in last 6 months? 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   Hobbies: Reading  Patient Goals: "To walk normally." Pt would like to decrease his pain and be able to walk and hike    OBJECTIVE:  Patient Surveys  FOTO 38, predicted improvement to 4  Cognition Patient is oriented to person, place, and time.  Recent memory is intact.  Remote memory is intact.  Attention span and concentration are intact.  Expressive speech is intact.  Patient's fund of knowledge is within normal limits for educational level.    Gross Musculoskeletal Assessment Tremor: None Bulk: Normal Tone: Normal No visible step-off along spinal column, no signs of scoliosis  GAIT: Antalgic gait on the RLE. Pt ambulates with single hiking pole  Posture: Lumbar lordosis: WNL Iliac crest height: Equal bilaterally Lumbar lateral shift: Negative  AROM AROM (Normal range in degrees) AROM   Lumbar   Flexion (65) Moderate segmental loss, no pain  Extension (30) Moderate to severe loss of extension, anterior R hip pain  Right lateral flexion (25) Moderate loss, anterior R hip pain  Left lateral flexion (25) Moderate loss, no pain  Right rotation (30) Min loss, no pain  Left rotation (30) Min loss, no pain      Hip Right Left  Flexion (125) Limited by pain around 90 degrees  WNL  Extension (15)    Abduction (40)    Adduction     Internal Rotation (45) 10* 28  External Rotation (45) 55 50      Knee    Flexion (135) WNL WNL  Extension (0) WNL WNL      Ankle    Dorsiflexion  (20)    Plantarflexion (50)    Inversion (35)    Eversion (15)    (* = pain; Blank rows = not tested)  LE MMT: MMT (out of 5) Right  Left   Hip flexion 4* 5  Hip extension    Hip abduction (seated) 5* 5  Hip adduction (seated) 5* 5  Hip internal rotation 5* 5  Hip external rotation 5* 5  Knee flexion (seated) 4+* 5  Knee extension 5 5  Ankle dorsiflexion 5 5  Ankle plantarflexion Strong Strong  Ankle inversion    Ankle eversion    (* = pain; Blank rows = not tested)  Sensation Decreased sensation to light touch RLE L5-S1. Otherwise WNL BLE. Proprioception, stereognosis, and hot/cold testing deferred on this date.  Reflexes R/L Knee Jerk (L3/4): 1+/1+  Ankle Jerk (S1/2): 1+/1+   Muscle Length Hamstrings: R: Positive for tightness at 52 degrees L: Positive for tightness at 50 degrees [place] (quadriceps): R: Not examined L: Not examined Thomas (hip flexors): R: Not examined L: Not examined Ober: R: Not examined L: Not examined  Palpation Location Right Left         Lumbar paraspinals    Quadratus Lumborum    Gluteus Maximus 1   Gluteus Medius 1   Deep hip external rotators 1   PSIS 1   Fortin's Area (SIJ) 1   Greater Trochanter 1   (Blank rows = not tested) Graded on 0-4 scale (0 = no pain, 1 = pain, 2 = pain with wincing/grimacing/flinching, 3 = pain with withdrawal, 4 = unwilling to allow palpation)  Passive Accessory Intervertebral Motion Deferred  Special Tests Lumbar Radiculopathy and Discogenic: Centralization and Peripheralization (SN 92, -LR 0.12): Negative, worsening hip pain but symptoms do not centralize or peripheralize Slump (SN 83, -LR 0.32): R: Negative L: Negative SLR (SN 92, -LR 0.29): R: Negative L:  Negative Crossed SLR (SP 90): R: Negative L: Negative  Facet Joint: Extension-Rotation (SN 100, -LR 0.0): R: Negative L: Negative  Lumbar Foraminal Stenosis: Lumbar quadrant (SN 70): R: Negative L: Negative  Hip: FABER (SN 81): R: Positive  L: Negative FADIR (SN 94): R: Positive L: Negative Hip scour (SN 50): R: Negative L: Negative  SIJ:  Thigh Thrust (SN 88, -LR 0.18) : R: Not examined L: Not examined  Piriformis Syndrome: FAIR Test (SN 88, SP 83): R: Not examined L: Not examined  Functional Tasks Deferred  Beighton scale:  Deferred   TODAY'S TREATMENT:   SUBJECTIVE: Patient reports that he is doing well upon arrival.  He has been icing regularly and trying to modify his activity which he feels has been helping slightly with the pain.  He has a lumbar MRI scheduled for later this week.   PAIN: 6/10 R hip   Ther-ex  Hooklying isometric clams with belt around knees 10-second contract/10-second relax x 10; Issued HEP with education about how to perform;   Manual Therapy  Extensive prone STM with trigger point release to right posterior hip musculature including gluteus maximus, gluteus medius, and deep external rotators including piriformis; Supine belt assisted right hip distraction mobilizations 3 x 30s; Supine belt assisted right hip medial to lateral mobilizations with hip flexed to 45 degrees, grade III, 3 x 30s; Supine belt assisted right hip inferior mobilizations with hip flexed to 90 degrees, grade III, 3 x 30s; Supine belt assisted right hip inferior mobilizations with hip in FABER position grade III, 3 x 30s;   Trigger Point Dry Needling (TDN), unbilled Education performed with patient regarding potential benefit of TDN. Reviewed precautions and risks with patient. Extensive time spent with pt to ensure full understanding of TDN risks. Pt provided verbal consent to treatment. TDN performed to R piriformis with 2, 0.30 x  60 single needle placements with deep ache reported. Patient reports worse right hip pain after dry needling.   PATIENT EDUCATION:  Education details: Plan of care, dry needling, HEP Person educated: Patient Education method: Explanation and handout Education comprehension:  verbalized understanding and return demonstration   HOME EXERCISE PROGRAM:  Access Code: [email or web address] URL: [email or web address] Date: 04/03/2022 Prepared by: Roxana Hires  Exercises - Hooklying Bilateral Isometric Clamshell  - 2 x daily - 7 x weekly - 1 sets - 10 reps - 10s contract/10s relax hold - Supine Lower Trunk Rotation  - 2 x daily - 7 x weekly - 2 reps - 30s hold   ASSESSMENT:  CLINICAL IMPRESSION: Initiated manual techniques for right hip pain during session today.  Also performed trigger point dry needling to right piriformis.  Patient reporting worse pain at the end of the session.  Patient completed isometric clams during session and denies any increase in pain.  Issued for HEP.  Patient encouraged to continue icing aggressively and modify activity as well as follow up as scheduled. He will benefit from PT services to address deficits in strength and pain in order to return to full function at home.   OBJECTIVE IMPAIRMENTS: Abnormal gait, decreased ROM, decreased strength, and pain.   ACTIVITY LIMITATIONS: bending, sitting, and squatting  PARTICIPATION LIMITATIONS: community activity  PERSONAL FACTORS: 3+ comorbidities: CLL, OSA, OA, and neuropathy  are also affecting patient's functional outcome.   REHAB POTENTIAL: Good  CLINICAL DECISION MAKING: Evolving/moderate complexity  EVALUATION COMPLEXITY: Low   GOALS: Goals reviewed with patient? No  SHORT TERM GOALS: Target date: 04/30/2022  Pt will be independent with HEP in order to improve strength and decrease hip pain to improve pain-free function at home. Baseline:  Goal status: INITIAL   LONG TERM GOALS: Target date: 05/24/2022   Pt will increase FOTO to at least 61 to demonstrate significant improvement in function at home and work related to back pain  Baseline: 03/29/22: 38 Goal status: INITIAL  2.  Pt will decrease worst hip pain by at least 3 points on the NPRS in order to demonstrate  clinically significant reduction in hip pain. Baseline: 03/29/22: worst: 8/10; Goal status: INITIAL  3.  Pt will decrease mODI score by at least 13 points in order demonstrate clinically significant reduction in back pain/disability.       Baseline: 03/29/22: To be completed Goal status: INITIAL  4.  Pt will report at least 75% improvement in his hip symptoms in order to return to walking and hiking without notable increase in his pain.  Baseline:  Goal status: INITIAL   PLAN: PT FREQUENCY: 1-2x/week  PT DURATION: 8 weeks  PLANNED INTERVENTIONS: Therapeutic exercises, Therapeutic activity, Neuromuscular re-education, Balance training, Gait training, Patient/Family education, Self Care, Joint mobilization, Joint manipulation, Vestibular training, Canalith repositioning, Orthotic/Fit training, DME instructions, Dry Needling, Electrical stimulation, Spinal manipulation, Spinal mobilization, Cryotherapy, Moist heat, Taping, Traction, Ultrasound, Ionotophoresis '4mg'$ /ml Dexamethasone, Manual therapy, and Re-evaluation.  PLAN FOR NEXT SESSION: mODI, assess response to TDN, continue manual therapy and strengthening for hip, review/modify HEP as needed   Lyndel Safe Carmelita Amparo PT, DPT, GCS  Dona Walby, PT 04/04/2022, 10:47 AM

## 2022-04-05 ENCOUNTER — Ambulatory Visit
Admission: RE | Admit: 2022-04-05 | Discharge: 2022-04-05 | Disposition: A | Discharge status: Home / Self Care | Payer: Medicare Other | Source: Ambulatory Visit | Attending: Internal Medicine | Admitting: Internal Medicine

## 2022-04-05 DIAGNOSIS — G959 Disease of spinal cord, unspecified: Secondary | ICD-10-CM | POA: Insufficient documentation

## 2022-04-06 ENCOUNTER — Ambulatory Visit: Payer: Medicare Other

## 2022-04-06 DIAGNOSIS — M25551 Pain in right hip: Secondary | ICD-10-CM | POA: Diagnosis not present

## 2022-04-06 NOTE — Therapy (Signed)
OUTPATIENT PHYSICAL THERAPY THORACOLUMBAR/HIP TREATMENT  Patient Name: Chad Gaines MRN: [id number] DOB:January 05, 1946, 76 y.o., male Today's Date: 04/06/2022  END OF SESSION:  PT End of Session - 04/06/22 1139     Visit Number 3    Number of Visits 17    Date for PT Re-Evaluation 05/27/22    Authorization Type eval: 03/29/22    PT Start Time 0845    PT Stop Time 0930    PT Time Calculation (min) 45 min    Activity Tolerance Patient tolerated treatment well    Behavior During Therapy WFL for tasks assessed/performed             Past Medical History:  Diagnosis Date   Allergy to alpha-gal    GERD (gastroesophageal reflux disease)    Hypertension    Pinched nerve    some numbness R arm, R ankle   Sleep apnea    CPAP   Past Surgical History:  Procedure Laterality Date   APPENDECTOMY     CATARACT EXTRACTION W/PHACO Left 07/03/2021   Procedure: CATARACT EXTRACTION PHACO AND INTRAOCULAR LENS PLACEMENT (IOC) LEFT VIVITY TORIC 3.59 00:27.8;  Surgeon: Eulogio Bear, MD;  Location: [place];  Service: Ophthalmology;  Laterality: Left;  sleep apnea   CATARACT EXTRACTION W/PHACO Right 07/17/2021   Procedure: CATARACT EXTRACTION PHACO AND INTRAOCULAR LENS PLACEMENT (IOC) RIGHT VIVITY TORIC 4.20 00:33.2;  Surgeon: Eulogio Bear, MD;  Location[facility];  Service: Ophthalmology;  Laterality: Right;  sleep apnea   NASAL SEPTUM SURGERY     Patient Active Problem List   Diagnosis Date Noted   Adult hypothyroidism 12/25/2014   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   Apnea, sleep 12/25/2014   Essential (primary) hypertension 08/05/2014   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[place]) 03/08/2014   Chronic lymphocytic leukemia ([place]) 03/08/2014   PCP: Emily Filbert MD  REFERRING PROVIDER: Emily Filbert MD  REFERRING DIAG: M25.559 (ICD-10-CM) - Hip pain   RATIONALE FOR EVALUATION AND TREATMENT: Rehabilitation  THERAPY DIAG: Pain in right hip  ONSET DATE: Multiple  years  FOLLOW-UP APPT SCHEDULED WITH REFERRING PROVIDER: Yes   FROM INITIAL EVALUATION SUBJECTIVE:                                                                                                                                                                                         SUBJECTIVE STATEMENT:  R hip pain  PERTINENT HISTORY:  Pt reports R hip pain for multiple years with progressive worsening. Pain was initially in the anterior groin and posterior hip however the anterior hip pain has mostly resolved at this time with medication. He is now limping and  walking with a hiking pole. Pain worsens throughout the day. Pt reports progressive RLE weakness however he has not had any falls. Posterior hip pain has been nonresponsive to anti-inflammatories, oral steroids, gabapentin, and cortisone injections. Plain x-ray showed L5-S1 disc narrowing. Lumbar MRI ordered. "It has started to define my life." He used to like to walk and hike but hasn't been able to lately because of the pain. History of cervical pain and RUE radiculopathy.    PAIN:    Pain Intensity: Present: 0/10, Best: 0/10, Worst: 8/10 Pain location: Anterior and posterior R hip (mostly posterior) Pain Quality: aching posteriorly but becomes sharp with walking. Radiating: Yes, posterolateral RLE down to the ankle Numbness/Tingling: Yes, "prickly" down RLE; Focal Weakness: Yes Aggravating factors: walking, extended sitting, Relieving factors: static standing, changing positions, hasn't tried ice/heat 24-hour pain behavior: gradually worsens as the day progresses History of prior back injury, pain, surgery, or therapy: No Dominant hand: right Imaging: Yes, see history Red flags: Positive: History of cancer (CLL), Negative for bowel/bladder changes, saddle paresthesia, personal history of cancer, h/o spinal tumors, h/o compression fx, h/o abdominal aneurysm, abdominal pain, chills/fever, night sweats, nausea, vomiting, unrelenting  pain, first onset of insidious LBP <21 y/o  PRECAUTIONS: None  WEIGHT BEARING RESTRICTIONS: No  FALLS: Has patient fallen in last 6 months? 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   Hobbies: Reading  Patient Goals: "To walk normally." Pt would like to decrease his pain and be able to walk and hike    OBJECTIVE:  Patient Surveys  FOTO 38, predicted improvement to 60  Cognition Patient is oriented to person, place, and time.  Recent memory is intact.  Remote memory is intact.  Attention span and concentration are intact.  Expressive speech is intact.  Patient's fund of knowledge is within normal limits for educational level.    Gross Musculoskeletal Assessment Tremor: None Bulk: Normal Tone: Normal No visible step-off along spinal column, no signs of scoliosis  GAIT: Antalgic gait on the RLE. Pt ambulates with single hiking pole  Posture: Lumbar lordosis: WNL Iliac crest height: Equal bilaterally Lumbar lateral shift: Negative  AROM AROM (Normal range in degrees) AROM   Lumbar   Flexion (65) Moderate segmental loss, no pain  Extension (30) Moderate to severe loss of extension, anterior R hip pain  Right lateral flexion (25) Moderate loss, anterior R hip pain  Left lateral flexion (25) Moderate loss, no pain  Right rotation (30) Min loss, no pain  Left rotation (30) Min loss, no pain      Hip Right Left  Flexion (125) Limited by pain around 90 degrees  WNL  Extension (15)    Abduction (40)    Adduction     Internal Rotation (45) 10* 28  External Rotation (45) 55 50      Knee    Flexion (135) WNL WNL  Extension (0) WNL WNL      Ankle    Dorsiflexion  (20)    Plantarflexion (50)    Inversion (35)    Eversion (15)    (* = pain; Blank rows = not tested)  LE MMT: MMT (out of 5) Right  Left   Hip flexion 4* 5  Hip extension    Hip abduction (seated) 5* 5  Hip adduction (seated) 5* 5  Hip internal rotation 5* 5  Hip external rotation 5* 5  Knee flexion (seated) 4+* 5  Knee extension 5 5  Ankle dorsiflexion 5 5  Ankle plantarflexion Strong Strong  Ankle inversion    Ankle eversion    (* = pain; Blank rows = not tested)  Sensation Decreased sensation to light touch RLE L5-S1. Otherwise WNL BLE. Proprioception, stereognosis, and hot/cold testing deferred on this date.  Reflexes R/L Knee Jerk (L3/4): 1+/1+  Ankle Jerk (S1/2): 1+/1+   Muscle Length Hamstrings: R: Positive for tightness at 52 degrees L: Positive for tightness at 50 degrees [place] (quadriceps): R: Not examined L: Not examined Thomas (hip flexors): R: Not examined L: Not examined Ober: R: Not examined L: Not examined  Palpation Location Right Left         Lumbar paraspinals    Quadratus Lumborum    Gluteus Maximus 1   Gluteus Medius 1   Deep hip external rotators 1   PSIS 1   Fortin's Area (SIJ) 1   Greater Trochanter 1   (Blank rows = not tested) Graded on 0-4 scale (0 = no pain, 1 = pain, 2 = pain with wincing/grimacing/flinching, 3 = pain with withdrawal, 4 = unwilling to allow palpation)  Passive Accessory Intervertebral Motion Deferred  Special Tests Lumbar Radiculopathy and Discogenic: Centralization and Peripheralization (SN 92, -LR 0.12): Negative, worsening hip pain but symptoms do not centralize or peripheralize Slump (SN 83, -LR 0.32): R: Negative L: Negative SLR (SN 92, -LR 0.29): R: Negative L:  Negative Crossed SLR (SP 90): R: Negative L: Negative  Facet Joint: Extension-Rotation (SN 100, -LR 0.0): R: Negative L: Negative  Lumbar Foraminal Stenosis: Lumbar quadrant (SN 70): R: Negative L: Negative  Hip: FABER (SN 81): R: Positive  L: Negative FADIR (SN 94): R: Positive L: Negative Hip scour (SN 50): R: Negative L: Negative  SIJ:  Thigh Thrust (SN 88, -LR 0.18) : R: Not examined L: Not examined  Piriformis Syndrome: FAIR Test (SN 88, SP 83): R: Not examined L: Not examined  Functional Tasks Deferred  Beighton scale:  Deferred   TODAY'S TREATMENT:   SUBJECTIVE: Patient reports that he is doing alright upon arrival.  He had his lumbar MRI but has not yet received results. He was sore after the last therapy session and yesterday. He has been performing his HEP but notes that the lumbar rotations increase his pain.   PAIN: R hip pain reported upon arrival;   Ther-ex  Hooklying isometric clams with belt around knees 10-second contract/10-second relax x 10; Lumbar rotation rocking x 60s; Hooklying adductor ball squeeze 10s contract/10s relax x 10; Updated HEP with education about how to perform;   Manual Therapy  Supine belt assisted right hip distraction mobilizations 3 x 30s; Supine belt assisted right hip medial to lateral mobilizations with hip flexed to 45 degrees, grade III, 3 x 30s; Supine belt assisted right hip inferior mobilizations with hip flexed to 90 degrees, grade III, 3 x 30s; Supine belt assisted right hip inferior mobilizations with hip in FABER position grade III, 3 x 30s; Supine R hip AP mobilizations, grade III, 3 x 30s; STM to R anterior and lateral hip;   PATIENT EDUCATION:  Education details: Plan of care, HEP updates Person educated: Patient Education method: Explanation and handout Education comprehension: verbalized understanding and return demonstration   HOME EXERCISE PROGRAM:  Access Code:  [email or web address] URL: [email or web address] Date: 04/06/2022 Prepared by: Roxana Hires  Exercises - Hooklying Bilateral Isometric Clamshell  - 2 x daily - 7 x weekly - 1 sets - 10 reps - 10s contract/10s relax hold - Seated Hip Adduction Isometrics with Ball  - 2 x daily  - 7 x weekly - 1 sets - 10 reps - 10s hold - Supine Lower Trunk Rotation  - 2 x daily - 7 x weekly - 2 reps - 30s hold   ASSESSMENT:  CLINICAL IMPRESSION: Continued manual techniques for right hip pain during session today. Deferred trigger point dry needling to right piriformis given the level of soreness pt experienced after last session. Reviewed HEP and added isometric adductor squeezes. Modified lumbar rotations to reinforce importance of pain-free range.  Patient encouraged to continue activity modification as well as follow up as scheduled. He will benefit from PT services to address deficits in strength and pain in order to return to full function at home.   OBJECTIVE IMPAIRMENTS: Abnormal gait, decreased ROM, decreased strength, and pain.   ACTIVITY LIMITATIONS: bending, sitting, and squatting  PARTICIPATION LIMITATIONS: community activity  PERSONAL FACTORS: 3+ comorbidities: CLL, OSA, OA, and neuropathy  are also affecting patient's functional outcome.   REHAB POTENTIAL: Good  CLINICAL DECISION MAKING: Evolving/moderate complexity  EVALUATION COMPLEXITY: Low   GOALS: Goals reviewed with patient? No  SHORT TERM GOALS: Target date: 04/30/2022  Pt will be independent with HEP in order to improve strength and decrease hip pain to improve pain-free function at home. Baseline:  Goal status: INITIAL   LONG TERM GOALS: Target date: 05/24/2022   Pt will increase FOTO to at least 61 to demonstrate significant improvement in function at home and work related to back pain  Baseline: 03/29/22: 38 Goal status: INITIAL  2.  Pt will decrease worst hip pain by at least 3 points on the NPRS in order to demonstrate clinically significant reduction in hip pain. Baseline: 03/29/22: worst: 8/10; Goal status: INITIAL  3.  Pt will decrease mODI score by at least 13 points in order demonstrate clinically significant reduction in back pain/disability.       Baseline: 03/29/22: To be  completed Goal status: INITIAL  4.  Pt will report at least 75% improvement in his hip symptoms in order to return to walking and hiking without notable increase in his pain.  Baseline:  Goal status: INITIAL   PLAN: PT FREQUENCY: 1-2x/week  PT DURATION: 8 weeks  PLANNED INTERVENTIONS: Therapeutic exercises, Therapeutic activity, Neuromuscular re-education, Balance training, Gait training, Patient/Family education, Self Care, Joint mobilization, Joint manipulation, Vestibular training, Canalith repositioning, Orthotic/Fit training, DME instructions, Dry Needling, Electrical stimulation, Spinal manipulation, Spinal mobilization, Cryotherapy, Moist heat, Taping, Traction, Ultrasound, Ionotophoresis '4mg'$ /ml Dexamethasone, Manual therapy, and Re-evaluation.  PLAN FOR NEXT SESSION: pt to complete mODI, continue manual therapy and strengthening for hip, review/modify HEP as needed   Lyndel Safe Makeila Yamaguchi PT, DPT, GCS  Mariely Mahr, PT 04/06/2022, 11:44 AM

## 2022-04-10 ENCOUNTER — Ambulatory Visit: Payer: Medicare Other

## 2022-04-10 DIAGNOSIS — M25551 Pain in right hip: Secondary | ICD-10-CM | POA: Diagnosis not present

## 2022-04-10 NOTE — Therapy (Unsigned)
OUTPATIENT PHYSICAL THERAPY THORACOLUMBAR/HIP TREATMENT  Patient Name: Chad Gaines MRN: [id number] DOB:Dec 11, 1945, 76 y.o., male Today's Date: 04/11/2022  END OF SESSION:  PT End of Session - 04/10/22 1313     Visit Number 4    Number of Visits 17    Date for PT Re-Evaluation 05/27/22    Authorization Type eval: 03/29/22    PT Start Time 1315    PT Stop Time 1400    PT Time Calculation (min) 45 min    Activity Tolerance Patient tolerated treatment well    Behavior During Therapy WFL for tasks assessed/performed            Past Medical History:  Diagnosis Date   Allergy to alpha-gal    GERD (gastroesophageal reflux disease)    Hypertension    Pinched nerve    some numbness R arm, R ankle   Sleep apnea    CPAP   Past Surgical History:  Procedure Laterality Date   APPENDECTOMY     CATARACT EXTRACTION W/PHACO Left 07/03/2021   Procedure: CATARACT EXTRACTION PHACO AND INTRAOCULAR LENS PLACEMENT (IOC) LEFT VIVITY TORIC 3.59 00:27.8;  Surgeon: Eulogio Bear, MD;  Location: [place];  Service: Ophthalmology;  Laterality: Left;  sleep apnea   CATARACT EXTRACTION W/PHACO Right 07/17/2021   Procedure: CATARACT EXTRACTION PHACO AND INTRAOCULAR LENS PLACEMENT (IOC) RIGHT VIVITY TORIC 4.20 00:33.2;  Surgeon: Eulogio Bear, MD;  Location: [place];  Service: Ophthalmology;  Laterality: Right;  sleep apnea   NASAL SEPTUM SURGERY     Patient Active Problem List   Diagnosis Date Noted   Adult hypothyroidism 12/25/2014   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   Apnea, sleep 12/25/2014   Essential (primary) hypertension 08/05/2014   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[place]) 03/08/2014   Chronic lymphocytic leukemia ([place]) 03/08/2014   PCP: Emily Filbert MD  REFERRING PROVIDER: Emily Filbert MD  REFERRING DIAG: M25.559 (ICD-10-CM) - Hip pain   RATIONALE FOR EVALUATION AND TREATMENT: Rehabilitation  THERAPY DIAG: Pain in right hip  ONSET DATE: Multiple  years  FOLLOW-UP APPT SCHEDULED WITH REFERRING PROVIDER: Yes   FROM INITIAL EVALUATION SUBJECTIVE:                                                                                                                                                                                         SUBJECTIVE STATEMENT:  R hip pain  PERTINENT HISTORY:  Pt reports R hip pain for multiple years with progressive worsening. Pain was initially in the anterior groin and posterior hip however the anterior hip pain has mostly resolved at this time with medication. He is now limping and walking  with a hiking pole. Pain worsens throughout the day. Pt reports progressive RLE weakness however he has not had any falls. Posterior hip pain has been nonresponsive to anti-inflammatories, oral steroids, gabapentin, and cortisone injections. Plain x-ray showed L5-S1 disc narrowing. Lumbar MRI ordered. "It has started to define my life." He used to like to walk and hike but hasn't been able to lately because of the pain. History of cervical pain and RUE radiculopathy.    PAIN:    Pain Intensity: Present: 0/10, Best: 0/10, Worst: 8/10 Pain location: Anterior and posterior R hip (mostly posterior) Pain Quality: aching posteriorly but becomes sharp with walking. Radiating: Yes, posterolateral RLE down to the ankle Numbness/Tingling: Yes, "prickly" down RLE; Focal Weakness: Yes Aggravating factors: walking, extended sitting, Relieving factors: static standing, changing positions, hasn't tried ice/heat 24-hour pain behavior: gradually worsens as the day progresses History of prior back injury, pain, surgery, or therapy: No Dominant hand: right Imaging: Yes, see history Red flags: Positive: History of cancer (CLL), Negative for bowel/bladder changes, saddle paresthesia, personal history of cancer, h/o spinal tumors, h/o compression fx, h/o abdominal aneurysm, abdominal pain, chills/fever, night sweats, nausea, vomiting, unrelenting  pain, first onset of insidious LBP <21 y/o  PRECAUTIONS: None  WEIGHT BEARING RESTRICTIONS: No  FALLS: Has patient fallen in last 6 months? 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   Hobbies: Reading  Patient Goals: "To walk normally." Pt would like to decrease his pain and be able to walk and hike    OBJECTIVE:  Patient Surveys  FOTO 38, predicted improvement to 57  Cognition Patient is oriented to person, place, and time.  Recent memory is intact.  Remote memory is intact.  Attention span and concentration are intact.  Expressive speech is intact.  Patient's fund of knowledge is within normal limits for educational level.    Gross Musculoskeletal Assessment Tremor: None Bulk: Normal Tone: Normal No visible step-off along spinal column, no signs of scoliosis  GAIT: Antalgic gait on the RLE. Pt ambulates with single hiking pole  Posture: Lumbar lordosis: WNL Iliac crest height: Equal bilaterally Lumbar lateral shift: Negative  AROM AROM (Normal range in degrees) AROM   Lumbar   Flexion (65) Moderate segmental loss, no pain  Extension (30) Moderate to severe loss of extension, anterior R hip pain  Right lateral flexion (25) Moderate loss, anterior R hip pain  Left lateral flexion (25) Moderate loss, no pain  Right rotation (30) Min loss, no pain  Left rotation (30) Min loss, no pain      Hip Right Left  Flexion (125) Limited by pain around 90 degrees  WNL  Extension (15)    Abduction (40)    Adduction     Internal Rotation (45) 10* 28  External Rotation (45) 55 50      Knee    Flexion (135) WNL WNL  Extension (0) WNL WNL      Ankle    Dorsiflexion  (20)    Plantarflexion (50)    Inversion (35)    Eversion (15)    (* = pain; Blank rows = not tested)  LE MMT: MMT (out of 5) Right  Left   Hip flexion 4* 5  Hip extension    Hip abduction (seated) 5* 5  Hip adduction (seated) 5* 5  Hip internal rotation 5* 5  Hip external rotation 5* 5  Knee flexion (seated) 4+* 5  Knee extension 5 5  Ankle dorsiflexion 5 5  Ankle plantarflexion Strong Strong  Ankle inversion    Ankle eversion    (* = pain; Blank rows = not tested)  Sensation Decreased sensation to light touch RLE L5-S1. Otherwise WNL BLE. Proprioception, stereognosis, and hot/cold testing deferred on this date.  Reflexes R/L Knee Jerk (L3/4): 1+/1+  Ankle Jerk (S1/2): 1+/1+   Muscle Length Hamstrings: R: Positive for tightness at 52 degrees L: Positive for tightness at 50 degrees [place] (quadriceps): R: Not examined L: Not examined Thomas (hip flexors): R: Not examined L: Not examined Ober: R: Not examined L: Not examined  Palpation Location Right Left         Lumbar paraspinals    Quadratus Lumborum    Gluteus Maximus 1   Gluteus Medius 1   Deep hip external rotators 1   PSIS 1   Fortin's Area (SIJ) 1   Greater Trochanter 1   (Blank rows = not tested) Graded on 0-4 scale (0 = no pain, 1 = pain, 2 = pain with wincing/grimacing/flinching, 3 = pain with withdrawal, 4 = unwilling to allow palpation)  Passive Accessory Intervertebral Motion Deferred  Special Tests Lumbar Radiculopathy and Discogenic: Centralization and Peripheralization (SN 92, -LR 0.12): Negative, worsening hip pain but symptoms do not centralize or peripheralize Slump (SN 83, -LR 0.32): R: Negative L: Negative SLR (SN 92, -LR 0.29): R: Negative L:  Negative Crossed SLR (SP 90): R: Negative L: Negative  Facet Joint: Extension-Rotation (SN 100, -LR 0.0): R: Negative L: Negative  Lumbar Foraminal Stenosis: Lumbar quadrant (SN 70): R: Negative L: Negative  Hip: FABER (SN 81): R: Positive  L: Negative FADIR (SN 94): R: Positive L: Negative Hip scour (SN 50): R: Negative L: Negative  SIJ:  Thigh Thrust (SN 88, -LR 0.18) : R: Not examined L: Not examined  Piriformis Syndrome: FAIR Test (SN 88, SP 83): R: Not examined L: Not examined  Functional Tasks Deferred  Beighton scale:  Deferred   TODAY'S TREATMENT:   SUBJECTIVE: Patient reports that he is doing alright upon arrival. He had his lumbar MRI and received the results on MyChart but doesn't understand how to interpret the results. Dr. Sabra Heck has not yet commented on the results. No significant changes since the last therapy session.  PAIN: R hip pain reported upon arrival;   Ther-ex  Discussed lumbar MRI findings with patient; Hooklying isometric clams with manual resistance from therapist x 10; Lumbar rotation rocking x 60s; Hooklying adductor squeeze with manual resistance from therapist x 10;   Manual Therapy  Lumbar mobilizations reproduce RLE radicular symptoms at L3 and L4 today; Prone STM with hypervolt to posterior R hip musculature; Supine belt assisted right hip distraction mobilizations 3 x 30s; Supine belt assisted right hip medial to lateral mobilizations with hip flexed to 45 degrees, grade III, 3 x 30s; Supine belt assisted right hip inferior mobilizations with hip flexed to 90 degrees, grade III, x 30s, discontinued due to pain; Supine belt assisted right hip inferior mobilizations with hip in FABER position grade III, 3 x 30s; Supine R hip AP mobilizations, grade III, x 30s, discontinued secondary to pain; Supine R hamstring stretch with R ankle DF/PF for sciatic nerve gliding;    PATIENT EDUCATION:  Education details: Plan  of care, MRI results, benefit of plain films of R hip Person educated: Patient Education method: Explanation Education comprehension: verbalized understanding and returned demonstration   HOME EXERCISE PROGRAM:  Access Code: [email or web address] URL:  [email or web address] Date: 04/06/2022 Prepared by: Roxana Hires  Exercises - Hooklying Bilateral Isometric Clamshell  - 2 x daily - 7 x weekly - 1 sets - 10 reps - 10s contract/10s relax hold - Seated Hip Adduction Isometrics with Ball  - 2 x daily - 7 x weekly - 1 sets - 10 reps - 10s hold - Supine Lower Trunk Rotation  - 2 x daily - 7 x weekly - 2 reps - 30s hold   ASSESSMENT:  CLINICAL IMPRESSION: Continued manual techniques for right hip pain during session today including soft tissue mobilizations and belt-assisted R hip mobilizations. Deferred trigger point dry needling again today. His R hip remains very painful and highly irritable to gentle palpation and ROM. Therapist is able to reproduce some of his RLE radicular symptoms as well today with passive accessory mobility testing of his lumbar spine. Continued with very gentle strengthening. Pt continues with severe lack of R hip internal rotation and he would likely benefit from plain films of his R hip to assess joint spacing. Therapist will reach out to PCP with request. No HEP updates on this date. Patient encouraged to continue activity modification as well as follow up as scheduled. He will benefit from PT services to address deficits in strength and pain in order to return to full function at home and with leisure activity.   OBJECTIVE IMPAIRMENTS: Abnormal gait, decreased ROM, decreased strength, and pain.   ACTIVITY LIMITATIONS: bending, sitting, and squatting  PARTICIPATION LIMITATIONS: community activity  PERSONAL FACTORS: 3+ comorbidities: CLL, OSA, OA, and neuropathy  are also affecting patient's functional outcome.   REHAB POTENTIAL: Good  CLINICAL DECISION MAKING: Evolving/moderate complexity  EVALUATION COMPLEXITY: Low   GOALS: Goals reviewed with patient? No  SHORT TERM GOALS: Target date: 04/30/2022  Pt will be independent with HEP in order to improve strength and decrease hip pain to improve  pain-free function at home. Baseline:  Goal status: INITIAL   LONG TERM GOALS: Target date: 05/24/2022   Pt will increase FOTO to at least 61 to demonstrate significant improvement in function at home and work related to back pain  Baseline: 03/29/22: 38 Goal status: INITIAL  2.  Pt will decrease worst hip pain by at least 3 points on the NPRS in order to demonstrate clinically significant reduction in hip pain. Baseline: 03/29/22: worst: 8/10; Goal status: INITIAL  3.  Pt will decrease mODI score by at least 13 points in order demonstrate clinically significant reduction in back pain/disability.       Baseline: 03/29/22: To be completed Goal status: INITIAL  4.  Pt will report at least 75% improvement in his hip symptoms in order to return to walking and hiking without notable increase in his pain.  Baseline:  Goal status: INITIAL   PLAN: PT FREQUENCY: 1-2x/week  PT DURATION: 8 weeks  PLANNED INTERVENTIONS: Therapeutic exercises, Therapeutic activity, Neuromuscular re-education, Balance training, Gait training, Patient/Family education, Self Care, Joint mobilization, Joint manipulation, Vestibular training, Canalith repositioning, Orthotic/Fit training, DME instructions, Dry Needling, Electrical stimulation, Spinal manipulation, Spinal mobilization, Cryotherapy, Moist heat, Taping, Traction, Ultrasound, Ionotophoresis '4mg'$ /ml Dexamethasone, Manual therapy, and Re-evaluation.  PLAN FOR NEXT SESSION: pt to complete mODI, continue manual therapy and strengthening for hip, review/modify HEP as needed   Lyndel Safe Aleda Madl PT, DPT, GCS  Nahmir Zeidman, PT 04/11/2022, 9:21 AM

## 2022-04-12 ENCOUNTER — Ambulatory Visit: Payer: Medicare Other

## 2022-04-12 DIAGNOSIS — M25551 Pain in right hip: Secondary | ICD-10-CM | POA: Diagnosis not present

## 2022-04-12 NOTE — Therapy (Signed)
OUTPATIENT PHYSICAL THERAPY THORACOLUMBAR/HIP TREATMENT  Patient Name: Chad Gaines MRN: [id number] DOB:01-09-1946, 76 y.o., male Today's Date: 04/12/2022  END OF SESSION:  PT End of Session - 04/12/22 0759     Visit Number 5    Number of Visits 17    Date for PT Re-Evaluation 05/27/22    Authorization Type eval: 03/29/22    PT Start Time 0800    PT Stop Time 0845    PT Time Calculation (min) 45 min    Activity Tolerance Patient tolerated treatment well    Behavior During Therapy WFL for tasks assessed/performed            Past Medical History:  Diagnosis Date   Allergy to alpha-gal    GERD (gastroesophageal reflux disease)    Hypertension    Pinched nerve    some numbness R arm, R ankle   Sleep apnea    CPAP   Past Surgical History:  Procedure Laterality Date   APPENDECTOMY     CATARACT EXTRACTION W/PHACO Left 07/03/2021   Procedure: CATARACT EXTRACTION PHACO AND INTRAOCULAR LENS PLACEMENT (IOC) LEFT VIVITY TORIC 3.59 00:27.8;  Surgeon: Eulogio Bear, MD;  Location: [facility];  Service: Ophthalmology;  Laterality: Left;  sleep apnea   CATARACT EXTRACTION W/PHACO Right 07/17/2021   Procedure: CATARACT EXTRACTION PHACO AND INTRAOCULAR LENS PLACEMENT (IOC) RIGHT VIVITY TORIC 4.20 00:33.2;  Surgeon: Eulogio Bear, MD;  Location: [facility];  Service: Ophthalmology;  Laterality: Right;  sleep apnea   NASAL SEPTUM SURGERY     Patient Active Problem List   Diagnosis Date Noted   Adult hypothyroidism 12/25/2014   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   Apnea, sleep 12/25/2014   Essential (primary) hypertension 08/05/2014   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[place]) 03/08/2014   Chronic lymphocytic leukemia ([place]) 03/08/2014   PCP: Emily Filbert MD  REFERRING PROVIDER: Emily Filbert MD  REFERRING DIAG: M25.559 (ICD-10-CM) - Hip pain   RATIONALE FOR EVALUATION AND TREATMENT: Rehabilitation  THERAPY DIAG: Pain in right hip  ONSET DATE: Multiple  years  FOLLOW-UP APPT SCHEDULED WITH REFERRING PROVIDER: Yes   FROM INITIAL EVALUATION SUBJECTIVE:                                                                                                                                                                                         SUBJECTIVE STATEMENT:  R hip pain  PERTINENT HISTORY:  Pt reports R hip pain for multiple years with progressive worsening. Pain was initially in the anterior groin and posterior hip however the anterior hip pain has mostly resolved at this time with medication. He is now limping and walking  with a hiking pole. Pain worsens throughout the day. Pt reports progressive RLE weakness however he has not had any falls. Posterior hip pain has been nonresponsive to anti-inflammatories, oral steroids, gabapentin, and cortisone injections. Plain x-ray showed L5-S1 disc narrowing. Lumbar MRI ordered. "It has started to define my life." He used to like to walk and hike but hasn't been able to lately because of the pain. History of cervical pain and RUE radiculopathy.    PAIN:    Pain Intensity: Present: 0/10, Best: 0/10, Worst: 8/10 Pain location: Anterior and posterior R hip (mostly posterior) Pain Quality: aching posteriorly but becomes sharp with walking. Radiating: Yes, posterolateral RLE down to the ankle Numbness/Tingling: Yes, "prickly" down RLE; Focal Weakness: Yes Aggravating factors: walking, extended sitting, Relieving factors: static standing, changing positions, hasn't tried ice/heat 24-hour pain behavior: gradually worsens as the day progresses History of prior back injury, pain, surgery, or therapy: No Dominant hand: right Imaging: Yes, see history Red flags: Positive: History of cancer (CLL), Negative for bowel/bladder changes, saddle paresthesia, personal history of cancer, h/o spinal tumors, h/o compression fx, h/o abdominal aneurysm, abdominal pain, chills/fever, night sweats, nausea, vomiting, unrelenting  pain, first onset of insidious LBP <21 y/o  PRECAUTIONS: None  WEIGHT BEARING RESTRICTIONS: No  FALLS: Has patient fallen in last 6 months? 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   Hobbies: Reading  Patient Goals: "To walk normally." Pt would like to decrease his pain and be able to walk and hike    OBJECTIVE:  Patient Surveys  FOTO 38, predicted improvement to 3  Cognition Patient is oriented to person, place, and time.  Recent memory is intact.  Remote memory is intact.  Attention span and concentration are intact.  Expressive speech is intact.  Patient's fund of knowledge is within normal limits for educational level.    Gross Musculoskeletal Assessment Tremor: None Bulk: Normal Tone: Normal No visible step-off along spinal column, no signs of scoliosis  GAIT: Antalgic gait on the RLE. Pt ambulates with single hiking pole  Posture: Lumbar lordosis: WNL Iliac crest height: Equal bilaterally Lumbar lateral shift: Negative  AROM AROM (Normal range in degrees) AROM   Lumbar   Flexion (65) Moderate segmental loss, no pain  Extension (30) Moderate to severe loss of extension, anterior R hip pain  Right lateral flexion (25) Moderate loss, anterior R hip pain  Left lateral flexion (25) Moderate loss, no pain  Right rotation (30) Min loss, no pain  Left rotation (30) Min loss, no pain      Hip Right Left  Flexion (125) Limited by pain around 90 degrees  WNL  Extension (15)    Abduction (40)    Adduction     Internal Rotation (45) 10* 28  External Rotation (45) 55 50      Knee    Flexion (135) WNL WNL  Extension (0) WNL WNL      Ankle    Dorsiflexion  (20)    Plantarflexion (50)    Inversion (35)    Eversion (15)    (* = pain; Blank rows = not tested)  LE MMT: MMT (out of 5) Right  Left   Hip flexion 4* 5  Hip extension    Hip abduction (seated) 5* 5  Hip adduction (seated) 5* 5  Hip internal rotation 5* 5  Hip external rotation 5* 5  Knee flexion (seated) 4+* 5  Knee extension 5 5  Ankle dorsiflexion 5 5  Ankle plantarflexion Strong Strong  Ankle inversion    Ankle eversion    (* = pain; Blank rows = not tested)  Sensation Decreased sensation to light touch RLE L5-S1. Otherwise WNL BLE. Proprioception, stereognosis, and hot/cold testing deferred on this date.  Reflexes R/L Knee Jerk (L3/4): 1+/1+  Ankle Jerk (S1/2): 1+/1+   Muscle Length Hamstrings: R: Positive for tightness at 52 degrees L: Positive for tightness at 50 degrees [place] (quadriceps): R: Not examined L: Not examined Thomas (hip flexors): R: Not examined L: Not examined Ober: R: Not examined L: Not examined  Palpation Location Right Left         Lumbar paraspinals    Quadratus Lumborum    Gluteus Maximus 1   Gluteus Medius 1   Deep hip external rotators 1   PSIS 1   Fortin's Area (SIJ) 1   Greater Trochanter 1   (Blank rows = not tested) Graded on 0-4 scale (0 = no pain, 1 = pain, 2 = pain with wincing/grimacing/flinching, 3 = pain with withdrawal, 4 = unwilling to allow palpation)  Passive Accessory Intervertebral Motion Deferred  Special Tests Lumbar Radiculopathy and Discogenic: Centralization and Peripheralization (SN 92, -LR 0.12): Negative, worsening hip pain but symptoms do not centralize or peripheralize Slump (SN 83, -LR 0.32): R: Negative L: Negative SLR (SN 92, -LR 0.29): R: Negative L:  Negative Crossed SLR (SP 90): R: Negative L: Negative  Facet Joint: Extension-Rotation (SN 100, -LR 0.0): R: Negative L: Negative  Lumbar Foraminal Stenosis: Lumbar quadrant (SN 70): R: Negative L: Negative  Hip: FABER (SN 81): R: Positive  L: Negative FADIR (SN 94): R: Positive L: Negative Hip scour (SN 50): R: Negative L: Negative  SIJ:  Thigh Thrust (SN 88, -LR 0.18) : R: Not examined L: Not examined  Piriformis Syndrome: FAIR Test (SN 88, SP 83): R: Not examined L: Not examined  Functional Tasks Deferred  Beighton scale:  Deferred   TODAY'S TREATMENT:   SUBJECTIVE: Patient reports that he is doing alright upon arrival. He believes that therapy has offered some slight improvement in his symptoms. No significant changes since the last therapy session.   PAIN: R hip pain reported upon arrival;   Ther-ex  Lumbar rotation rocking x 60s; Clams with manual resistance from therapist x 10; Adductor squeeze with manual resistance from therapist x 10;   Manual Therapy  Prone lumbar mobilizations L2-L5, grade I, 20s/bout x 1 bout/level; Prone STM with hypervolt to posterior R hip musculature; Prone STM to R lumbar paraspinals with effleurage and gentle trigger point release; Supine belt assisted right hip distraction mobilizations 2 x 30s; Supine belt assisted right hip medial to lateral mobilizations with hip flexed to 45 degrees, grade III, 2 x 30s; Supine belt assisted right hip inferior mobilizations with hip flexed to 90 degrees, grade III, 2 x 30s; Supine belt assisted right hip inferior mobilizations with hip in FABER position grade III, 3 x 30s;   Trigger Point Dry Needling (TDN), unbilled Education previoulsy performed with patient regarding potential benefit of TDN. Previously reviewed precautions and risks with patient. Pt provided verbal consent to treatment. In prone TDN performed to R lumbar multifidi with 2, 0.30 x 75 single needle placements (first at  L5 and second at L4) with deep ache reported. Pistoning technique utilized with intermittent static holds when discomfort increases significantly;    PATIENT EDUCATION:  Education details: Plan of care Person educated: Patient Education method:  Explanation Education comprehension: verbalized understanding and returned demonstration   HOME EXERCISE PROGRAM:  Access Code: [email or web address] URL: [email or web address] Date: 04/06/2022 Prepared by: Roxana Hires  Exercises - Hooklying Bilateral Isometric Clamshell  - 2 x daily - 7 x weekly - 1 sets - 10 reps - 10s contract/10s relax hold - Seated Hip Adduction Isometrics with Ball  - 2 x daily - 7 x weekly - 1 sets - 10 reps - 10s hold - Supine Lower Trunk Rotation  - 2 x daily - 7 x weekly - 2 reps - 30s hold   ASSESSMENT:  CLINICAL IMPRESSION: Continued manual techniques for right hip pain during session today including soft tissue mobilizations and belt-assisted R hip mobilizations. Performed trigger point dry needling today to lower lumbar spine with some reproduction of R hip pain during placements. His R hip remains very painful and highly irritable however does appear slightly less so than prior sessions. Continued with very gentle strengthening and deferred updating HEP until assessment of his response to TDN. Patient encouraged to continue activity modification as well as follow up as scheduled. He will benefit from PT services to address deficits in strength and pain in order to return to full function at home and with leisure activity.   OBJECTIVE IMPAIRMENTS: Abnormal gait, decreased ROM, decreased strength, and pain.   ACTIVITY LIMITATIONS: bending, sitting, and squatting  PARTICIPATION LIMITATIONS: community activity  PERSONAL FACTORS: 3+ comorbidities: CLL, OSA, OA, and neuropathy  are also affecting patient's functional outcome.   REHAB POTENTIAL: Good  CLINICAL DECISION MAKING: Evolving/moderate complexity  EVALUATION COMPLEXITY: Low   GOALS: Goals reviewed with patient? No  SHORT TERM GOALS: Target date: 04/30/2022  Pt will be independent with HEP in order to improve strength and decrease hip pain to improve pain-free function at home. Baseline:   Goal status: INITIAL   LONG TERM GOALS: Target date: 05/24/2022   Pt will increase FOTO to at least 61 to demonstrate significant improvement in function at home and work related to back pain  Baseline: 03/29/22: 38 Goal status: INITIAL  2.  Pt will decrease worst hip pain by at least 3 points on the NPRS in order to demonstrate clinically significant reduction in hip pain. Baseline: 03/29/22: worst: 8/10; Goal status: INITIAL  3.  Pt will decrease mODI score by at least 13 points in order demonstrate clinically significant reduction in back pain/disability.       Baseline: 03/29/22: To be completed Goal status: INITIAL  4.  Pt will report at least 75% improvement in his hip symptoms in order to return to walking and hiking without notable increase in his pain.  Baseline:  Goal status: INITIAL   PLAN: PT FREQUENCY: 1-2x/week  PT DURATION: 8 weeks  PLANNED INTERVENTIONS: Therapeutic exercises, Therapeutic activity, Neuromuscular re-education, Balance training, Gait training, Patient/Family education, Self Care, Joint mobilization, Joint manipulation, Vestibular training, Canalith repositioning, Orthotic/Fit training, DME instructions, Dry Needling, Electrical stimulation, Spinal manipulation, Spinal mobilization, Cryotherapy, Moist heat, Taping, Traction, Ultrasound, Ionotophoresis '4mg'$ /ml Dexamethasone, Manual therapy, and Re-evaluation.  PLAN FOR NEXT SESSION: pt to complete mODI, continue manual therapy and strengthening for hip, review/modify HEP as needed   Lyndel Safe Teriann Livingood PT, DPT, G[facility]  Setsuko Robins, PT 04/12/2022, 10:45 AM

## 2022-04-18 ENCOUNTER — Ambulatory Visit: Payer: Medicare Other | Attending: Internal Medicine

## 2022-04-18 DIAGNOSIS — M25551 Pain in right hip: Secondary | ICD-10-CM

## 2022-04-18 NOTE — Therapy (Signed)
OUTPATIENT PHYSICAL THERAPY THORACOLUMBAR/HIP TREATMENT  Patient Name: Chad Gaines MRN: [id number] DOB:23-Apr-1945, 77 y.o., male Today's Date: 04/18/2022  END OF SESSION:  PT End of Session - 04/18/22 0918     Visit Number 6    Number of Visits 17    Date for PT Re-Evaluation 05/27/22    Authorization Type eval: 03/29/22    PT Start Time 0805    PT Stop Time 0850    PT Time Calculation (min) 45 min    Activity Tolerance Patient tolerated treatment well    Behavior During Therapy WFL for tasks assessed/performed            Past Medical History:  Diagnosis Date   Allergy to alpha-gal    GERD (gastroesophageal reflux disease)    Hypertension    Pinched nerve    some numbness R arm, R ankle   Sleep apnea    CPAP   Past Surgical History:  Procedure Laterality Date   APPENDECTOMY     CATARACT EXTRACTION W/PHACO Left 07/03/2021   Procedure: CATARACT EXTRACTION PHACO AND INTRAOCULAR LENS PLACEMENT (IOC) LEFT VIVITY TORIC 3.59 00:27.8;  Surgeon: Eulogio Bear, MD;  Location: [place];  Service: Ophthalmology;  Laterality: Left;  sleep apnea   CATARACT EXTRACTION W/PHACO Right 07/17/2021   Procedure: CATARACT EXTRACTION PHACO AND INTRAOCULAR LENS PLACEMENT (IOC) RIGHT VIVITY TORIC 4.20 00:33.2;  Surgeon: Eulogio Bear, MD;  Location: [place];  Service: Ophthalmology;  Laterality: Right;  sleep apnea   NASAL SEPTUM SURGERY     Patient Active Problem List   Diagnosis Date Noted   Adult hypothyroidism 12/25/2014   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   Apnea, sleep 12/25/2014   Essential (primary) hypertension 08/05/2014   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[place]) 03/08/2014   Chronic lymphocytic leukemia ([place]) 03/08/2014   PCP: Emily Filbert MD  REFERRING PROVIDER: Emily Filbert MD  REFERRING DIAG: M25.559 (ICD-10-CM) - Hip pain   RATIONALE FOR EVALUATION AND TREATMENT: Rehabilitation  THERAPY DIAG: Pain in right hip  ONSET DATE: Multiple  years  FOLLOW-UP APPT SCHEDULED WITH REFERRING PROVIDER: Yes   FROM INITIAL EVALUATION SUBJECTIVE:                                                                                                                                                                                         SUBJECTIVE STATEMENT:  R hip pain  PERTINENT HISTORY:  Pt reports R hip pain for multiple years with progressive worsening. Pain was initially in the anterior groin and posterior hip however the anterior hip pain has mostly resolved at this time with medication. He is now limping and walking  with a hiking pole. Pain worsens throughout the day. Pt reports progressive RLE weakness however he has not had any falls. Posterior hip pain has been nonresponsive to anti-inflammatories, oral steroids, gabapentin, and cortisone injections. Plain x-ray showed L5-S1 disc narrowing. Lumbar MRI ordered. "It has started to define my life." He used to like to walk and hike but hasn't been able to lately because of the pain. History of cervical pain and RUE radiculopathy.    PAIN:    Pain Intensity: Present: 0/10, Best: 0/10, Worst: 8/10 Pain location: Anterior and posterior R hip (mostly posterior) Pain Quality: aching posteriorly but becomes sharp with walking. Radiating: Yes, posterolateral RLE down to the ankle Numbness/Tingling: Yes, "prickly" down RLE; Focal Weakness: Yes Aggravating factors: walking, extended sitting, Relieving factors: static standing, changing positions, hasn't tried ice/heat 24-hour pain behavior: gradually worsens as the day progresses History of prior back injury, pain, surgery, or therapy: No Dominant hand: right Imaging: Yes, see history Red flags: Positive: History of cancer (CLL), Negative for bowel/bladder changes, saddle paresthesia, personal history of cancer, h/o spinal tumors, h/o compression fx, h/o abdominal aneurysm, abdominal pain, chills/fever, night sweats, nausea, vomiting, unrelenting  pain, first onset of insidious LBP <21 y/o  PRECAUTIONS: None  WEIGHT BEARING RESTRICTIONS: No  FALLS: Has patient fallen in last 6 months? 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   Hobbies: Reading  Patient Goals: "To walk normally." Pt would like to decrease his pain and be able to walk and hike    OBJECTIVE:  Patient Surveys  FOTO 38, predicted improvement to 11  Cognition Patient is oriented to person, place, and time.  Recent memory is intact.  Remote memory is intact.  Attention span and concentration are intact.  Expressive speech is intact.  Patient's fund of knowledge is within normal limits for educational level.    Gross Musculoskeletal Assessment Tremor: None Bulk: Normal Tone: Normal No visible step-off along spinal column, no signs of scoliosis  GAIT: Antalgic gait on the RLE. Pt ambulates with single hiking pole  Posture: Lumbar lordosis: WNL Iliac crest height: Equal bilaterally Lumbar lateral shift: Negative  AROM AROM (Normal range in degrees) AROM   Lumbar   Flexion (65) Moderate segmental loss, no pain  Extension (30) Moderate to severe loss of extension, anterior R hip pain  Right lateral flexion (25) Moderate loss, anterior R hip pain  Left lateral flexion (25) Moderate loss, no pain  Right rotation (30) Min loss, no pain  Left rotation (30) Min loss, no pain      Hip Right Left  Flexion (125) Limited by pain around 90 degrees  WNL  Extension (15)    Abduction (40)    Adduction     Internal Rotation (45) 10* 28  External Rotation (45) 55 50      Knee    Flexion (135) WNL WNL  Extension (0) WNL WNL      Ankle    Dorsiflexion  (20)    Plantarflexion (50)    Inversion (35)    Eversion (15)    (* = pain; Blank rows = not tested)  LE MMT: MMT (out of 5) Right  Left   Hip flexion 4* 5  Hip extension    Hip abduction (seated) 5* 5  Hip adduction (seated) 5* 5  Hip internal rotation 5* 5  Hip external rotation 5* 5  Knee flexion (seated) 4+* 5  Knee extension 5 5  Ankle dorsiflexion 5 5  Ankle plantarflexion Strong Strong  Ankle inversion    Ankle eversion    (* = pain; Blank rows = not tested)  Sensation Decreased sensation to light touch RLE L5-S1. Otherwise WNL BLE. Proprioception, stereognosis, and hot/cold testing deferred on this date.  Reflexes R/L Knee Jerk (L3/4): 1+/1+  Ankle Jerk (S1/2): 1+/1+   Muscle Length Hamstrings: R: Positive for tightness at 52 degrees L: Positive for tightness at 50 degrees [place] (quadriceps): R: Not examined L: Not examined Thomas (hip flexors): R: Not examined L: Not examined Ober: R: Not examined L: Not examined  Palpation Location Right Left         Lumbar paraspinals    Quadratus Lumborum    Gluteus Maximus 1   Gluteus Medius 1   Deep hip external rotators 1   PSIS 1   Fortin's Area (SIJ) 1   Greater Trochanter 1   (Blank rows = not tested) Graded on 0-4 scale (0 = no pain, 1 = pain, 2 = pain with wincing/grimacing/flinching, 3 = pain with withdrawal, 4 = unwilling to allow palpation)  Passive Accessory Intervertebral Motion Deferred  Special Tests Lumbar Radiculopathy and Discogenic: Centralization and Peripheralization (SN 92, -LR 0.12): Negative, worsening hip pain but symptoms do not centralize or peripheralize Slump (SN 83, -LR 0.32): R: Negative L: Negative SLR (SN 92, -LR 0.29): R: Negative L:  Negative Crossed SLR (SP 90): R: Negative L: Negative  Facet Joint: Extension-Rotation (SN 100, -LR 0.0): R: Negative L: Negative  Lumbar Foraminal Stenosis: Lumbar quadrant (SN 70): R: Negative L: Negative  Hip: FABER (SN 81): R: Positive  L: Negative FADIR (SN 94): R: Positive L: Negative Hip scour (SN 50): R: Negative L: Negative  SIJ:  Thigh Thrust (SN 88, -LR 0.18) : R: Not examined L: Not examined  Piriformis Syndrome: FAIR Test (SN 88, SP 83): R: Not examined L: Not examined  Functional Tasks Deferred  Beighton scale:  Deferred   TODAY'S TREATMENT:   SUBJECTIVE: Patient reports that he had an x-ray of his R hip since his last session and his PCP contacted him to let him know there are degenerative changes.  Pt states Dr. Sabra Heck recommended he see orthopedics for a consult.  No significant changes since the last therapy session.  He is still having difficulty with prolonged standing and walking due to R hip pain.  No falls noted, but he does notice the R hip "gives way" frequently.   PAIN: R hip pain reported upon arrival;   Ther-ex  Lumbar rotation rocking x 60s; Clams with manual resistance from therapist x 10; Adductor squeeze with manual resistance from therapist x 10;  Discussed PT perspective on benefits of seeing orthopedics for further evaluation/interventions for pain control  Discussed benefit of using AD on contralateral UE (he likes treking pole in R UE), pt practiced amb short distances in clinic using his treking pole (his preferred AD at this point in time) in L UE and sequencing this with R heel strike.  Pt amb with decreased speed and decreased stride length due to noting R hip pain during WB.  Also discussed benefit of using SPC vs treking pole possibly in future to improve safety.   Manual Therapy  Hip PROM  through ROM avoiding end ranges of motion where pt notes increased pain Supine belt assisted right hip distraction mobilizations 3 x 30s; Supine belt assisted right hip medial to lateral mobilizations with hip flexed to 45 degrees, grade III, 3 x 30s; Supine belt assisted right hip inferior mobilizations with hip flexed to 90 degrees, grade III, 3 x 30s; Supine belt assisted right hip  inferior mobilizations with hip in FABER position grade III, 3 x 30s;   Trigger Point Dry Needling (TDN), unbilled Not today: Education previoulsy performed with patient regarding potential benefit of TDN. Previously reviewed precautions and risks with patient. Pt provided verbal consent to treatment. In prone TDN performed to R lumbar multifidi with 2, 0.30 x 75 single needle placements (first at L5 and second at L4) with deep ache reported. Pistoning technique utilized with intermittent static holds when discomfort increases significantly;   Not today Prone lumbar mobilizations L2-L5, grade I, 20s/bout x 1 bout/level; Prone STM with hypervolt to posterior R hip musculature; Prone STM to R lumbar paraspinals with effleurage and gentle trigger point release;  PATIENT EDUCATION:  Education details: Plan of care Person educated: Patient Education method: Explanation Education comprehension: verbalized understanding and returned demonstration   HOME EXERCISE PROGRAM:  Access Code: [email or web address] URL: [email or web address] Date: 04/06/2022 Prepared by: Roxana Hires  Exercises - Hooklying Bilateral Isometric Clamshell  - 2 x daily - 7 x weekly - 1 sets - 10 reps - 10s contract/10s relax hold - Seated Hip Adduction Isometrics with Ball  - 2 x daily - 7 x weekly - 1 sets - 10 reps - 10s hold - Supine Lower Trunk Rotation  - 2 x daily - 7 x weekly - 2 reps - 30s hold   ASSESSMENT:  CLINICAL IMPRESSION: Continued manual techniques for right hip pain during session today.  Pt was very pleasant and motivated during today's session.  His pain continues to be most significant limitation at this point for progressing with strengthening and also is significantly affecting is standing/walking tolerance.  He experiences short term pain reduction with manual tx during session.  He would benefit from more practice amb with SPC vs treking pole as a strategy to reduce jt compression forces and  possibly offer some pain relief with WB.  Patient encouraged to continue activity modification as well as follow up as scheduled. He will benefit from PT services to address deficits in strength and pain in order to return to full function at home and with leisure activity.   OBJECTIVE IMPAIRMENTS: Abnormal gait, decreased ROM, decreased strength, and pain.   ACTIVITY LIMITATIONS: bending, sitting, and squatting  PARTICIPATION LIMITATIONS: community activity  PERSONAL FACTORS: 3+ comorbidities: CLL, OSA, OA, and neuropathy  are also affecting patient's functional outcome.   REHAB POTENTIAL: Good  CLINICAL DECISION MAKING: Evolving/moderate complexity  EVALUATION COMPLEXITY: Low   GOALS: Goals reviewed with patient? No  SHORT TERM GOALS: Target date: 04/30/2022  Pt will be independent with HEP in order to improve strength and decrease hip pain to improve pain-free function at home. Baseline:  Goal status: INITIAL   LONG TERM GOALS: Target date: 05/24/2022   Pt will increase FOTO to at least 61 to demonstrate significant improvement in function at home and work related to back pain  Baseline: 03/29/22: 38 Goal status: INITIAL  2.  Pt will decrease worst hip pain by at least 3 points on the NPRS in order to demonstrate clinically significant reduction in hip pain. Baseline: 03/29/22: worst: 8/10; Goal status: INITIAL  3.  Pt will decrease mODI score by at least 13 points in order demonstrate clinically significant reduction in back pain/disability.       Baseline: 03/29/22: To be completed Goal status: INITIAL  4.  Pt will report at least 75% improvement in his hip symptoms in order to return to walking and hiking without notable increase in his pain.  Baseline:  Goal status: INITIAL   PLAN: PT FREQUENCY: 1-2x/week  PT DURATION: 8 weeks  PLANNED INTERVENTIONS: Therapeutic exercises, Therapeutic activity, Neuromuscular re-education, Balance training, Gait training,  Patient/Family education, Self Care, Joint mobilization, Joint manipulation, Vestibular training, Canalith repositioning, Orthotic/Fit training, DME instructions, Dry Needling, Electrical stimulation, Spinal manipulation, Spinal mobilization, Cryotherapy, Moist heat, Taping, Traction, Ultrasound, Ionotophoresis '4mg'$ /ml Dexamethasone, Manual therapy, and Re-evaluation.  PLAN FOR NEXT SESSION: pt to complete mODI, continue manual therapy and strengthening for hip, review/modify HEP as needed   Merdis Delay, PT, DPT, OCS  Pincus Badder, PT 04/18/2022, 9:20 AM

## 2022-04-25 NOTE — Therapy (Signed)
OUTPATIENT PHYSICAL THERAPY THORACOLUMBAR/HIP TREATMENT  Patient Name: Chad Gaines MRN: [id number] DOB:1945-05-28, 77 y.o., male Today's Date: 04/27/2022  END OF SESSION:  PT End of Session - 04/26/22 0855     Visit Number 7    Number of Visits 17    Date for PT Re-Evaluation 05/27/22    Authorization Type eval: 03/29/22    PT Start Time 0758    PT Stop Time 0851    PT Time Calculation (min) 53 min    Activity Tolerance Patient tolerated treatment well    Behavior During Therapy WFL for tasks assessed/performed            Past Medical History:  Diagnosis Date   Allergy to alpha-gal    GERD (gastroesophageal reflux disease)    Hypertension    Pinched nerve    some numbness R arm, R ankle   Sleep apnea    CPAP   Past Surgical History:  Procedure Laterality Date   APPENDECTOMY     CATARACT EXTRACTION W/PHACO Left 07/03/2021   Procedure: CATARACT EXTRACTION PHACO AND INTRAOCULAR LENS PLACEMENT (IOC) LEFT VIVITY TORIC 3.59 00:27.8;  Surgeon: Eulogio Bear, MD;  Location: [place];  Service: Ophthalmology;  Laterality: Left;  sleep apnea   CATARACT EXTRACTION W/PHACO Right 07/17/2021   Procedure: CATARACT EXTRACTION PHACO AND INTRAOCULAR LENS PLACEMENT (IOC) RIGHT VIVITY TORIC 4.20 00:33.2;  Surgeon: Eulogio Bear, MD;  Location: [place];  Service: Ophthalmology;  Laterality: Right;  sleep apnea   NASAL SEPTUM SURGERY     Patient Active Problem List   Diagnosis Date Noted   Adult hypothyroidism 12/25/2014   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   Apnea, sleep 12/25/2014   Essential (primary) hypertension 08/05/2014   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[place]) 03/08/2014   Chronic lymphocytic leukemia ([place]) 03/08/2014   PCP: Emily Filbert MD  REFERRING PROVIDER: Emily Filbert MD  REFERRING DIAG: M25.559 (ICD-10-CM) - Hip pain   RATIONALE FOR EVALUATION AND TREATMENT: Rehabilitation  THERAPY DIAG: Pain in right hip  ONSET DATE: Multiple  years  FOLLOW-UP APPT SCHEDULED WITH REFERRING PROVIDER: Yes   FROM INITIAL EVALUATION SUBJECTIVE:                                                                                                                                                                                         SUBJECTIVE STATEMENT:  R hip pain  PERTINENT HISTORY:  Pt reports R hip pain for multiple years with progressive worsening. Pain was initially in the anterior groin and posterior hip however the anterior hip pain has mostly resolved at this time with medication. He is now limping and walking  with a hiking pole. Pain worsens throughout the day. Pt reports progressive RLE weakness however he has not had any falls. Posterior hip pain has been nonresponsive to anti-inflammatories, oral steroids, gabapentin, and cortisone injections. Plain x-ray showed L5-S1 disc narrowing. Lumbar MRI ordered. "It has started to define my life." He used to like to walk and hike but hasn't been able to lately because of the pain. History of cervical pain and RUE radiculopathy.    PAIN:    Pain Intensity: Present: 0/10, Best: 0/10, Worst: 8/10 Pain location: Anterior and posterior R hip (mostly posterior) Pain Quality: aching posteriorly but becomes sharp with walking. Radiating: Yes, posterolateral RLE down to the ankle Numbness/Tingling: Yes, "prickly" down RLE; Focal Weakness: Yes Aggravating factors: walking, extended sitting, Relieving factors: static standing, changing positions, hasn't tried ice/heat 24-hour pain behavior: gradually worsens as the day progresses History of prior back injury, pain, surgery, or therapy: No Dominant hand: right Imaging: Yes, see history Red flags: Positive: History of cancer (CLL), Negative for bowel/bladder changes, saddle paresthesia, personal history of cancer, h/o spinal tumors, h/o compression fx, h/o abdominal aneurysm, abdominal pain, chills/fever, night sweats, nausea, vomiting, unrelenting  pain, first onset of insidious LBP <21 y/o  PRECAUTIONS: None  WEIGHT BEARING RESTRICTIONS: No  FALLS: Has patient fallen in last 6 months? 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   Hobbies: Reading  Patient Goals: "To walk normally." Pt would like to decrease his pain and be able to walk and hike    OBJECTIVE:  Patient Surveys  FOTO 38, predicted improvement to 58  Cognition Patient is oriented to person, place, and time.  Recent memory is intact.  Remote memory is intact.  Attention span and concentration are intact.  Expressive speech is intact.  Patient's fund of knowledge is within normal limits for educational level.    Gross Musculoskeletal Assessment Tremor: None Bulk: Normal Tone: Normal No visible step-off along spinal column, no signs of scoliosis  GAIT: Antalgic gait on the RLE. Pt ambulates with single hiking pole  Posture: Lumbar lordosis: WNL Iliac crest height: Equal bilaterally Lumbar lateral shift: Negative  AROM AROM (Normal range in degrees) AROM   Lumbar   Flexion (65) Moderate segmental loss, no pain  Extension (30) Moderate to severe loss of extension, anterior R hip pain  Right lateral flexion (25) Moderate loss, anterior R hip pain  Left lateral flexion (25) Moderate loss, no pain  Right rotation (30) Min loss, no pain  Left rotation (30) Min loss, no pain      Hip Right Left  Flexion (125) Limited by pain around 90 degrees  WNL  Extension (15)    Abduction (40)    Adduction     Internal Rotation (45) 10* 28  External Rotation (45) 55 50      Knee    Flexion (135) WNL WNL  Extension (0) WNL WNL      Ankle    Dorsiflexion  (20)    Plantarflexion (50)    Inversion (35)    Eversion (15)    (* = pain; Blank rows = not tested)  LE MMT: MMT (out of 5) Right  Left   Hip flexion 4* 5  Hip extension    Hip abduction (seated) 5* 5  Hip adduction (seated) 5* 5  Hip internal rotation 5* 5  Hip external rotation 5* 5  Knee flexion (seated) 4+* 5  Knee extension 5 5  Ankle dorsiflexion 5 5  Ankle plantarflexion Strong Strong  Ankle inversion    Ankle eversion    (* = pain; Blank rows = not tested)  Sensation Decreased sensation to light touch RLE L5-S1. Otherwise WNL BLE. Proprioception, stereognosis, and hot/cold testing deferred on this date.  Reflexes R/L Knee Jerk (L3/4): 1+/1+  Ankle Jerk (S1/2): 1+/1+   Muscle Length Hamstrings: R: Positive for tightness at 52 degrees L: Positive for tightness at 50 degrees [place] (quadriceps): R: Not examined L: Not examined Thomas (hip flexors): R: Not examined L: Not examined Ober: R: Not examined L: Not examined  Palpation Location Right Left         Lumbar paraspinals    Quadratus Lumborum    Gluteus Maximus 1   Gluteus Medius 1   Deep hip external rotators 1   PSIS 1   Fortin's Area (SIJ) 1   Greater Trochanter 1   (Blank rows = not tested) Graded on 0-4 scale (0 = no pain, 1 = pain, 2 = pain with wincing/grimacing/flinching, 3 = pain with withdrawal, 4 = unwilling to allow palpation)  Passive Accessory Intervertebral Motion Deferred  Special Tests Lumbar Radiculopathy and Discogenic: Centralization and Peripheralization (SN 92, -LR 0.12): Negative, worsening hip pain but symptoms do not centralize or peripheralize Slump (SN 83, -LR 0.32): R: Negative L: Negative SLR (SN 92, -LR 0.29): R: Negative L:  Negative Crossed SLR (SP 90): R: Negative L: Negative  Facet Joint: Extension-Rotation (SN 100, -LR 0.0): R: Negative L: Negative  Lumbar Foraminal Stenosis: Lumbar quadrant (SN 70): R: Negative L: Negative  Hip: FABER (SN 81): R: Positive  L: Negative FADIR (SN 94): R: Positive L: Negative Hip scour (SN 50): R: Negative L: Negative  SIJ:  Thigh Thrust (SN 88, -LR 0.18) : R: Not examined L: Not examined  Piriformis Syndrome: FAIR Test (SN 88, SP 83): R: Not examined L: Not examined  Functional Tasks Deferred  Beighton scale:  Deferred   TODAY'S TREATMENT:   SUBJECTIVE: Patient reports that he is doing alright today. He was told that his hip radiographs showed significant arthritis. He is scheduled to see Dr. Roland Rack in early February. He arrives reporting considerable R hip pain. Pt notes relief after therapy however after driving home hip becomes stiff and starts to hurt again when getting out of his car.   PAIN: R hip pain reported upon arrival, not rated;   Ther-ex  NuStep L1-2 x 15 minutes for LE strengthening and R hip AAROM during interval history and education about plan of care (5 minutes unbilled); Clams with manual resistance from therapist x 10; Adductor squeeze with manual resistance from therapist x 10; Bridges x 10; RLE heel slides with manually resisted extension from therapist x 10; Standing R hip flexor stretch on chair x 30s; Seated R hamstring stretch x 30s; HEP updated and new handout provided to patient;   Manual Therapy  Hip PROM through ROM avoiding end ranges of motion where pt notes increased pain Supine belt assisted right hip long axis distraction mobilizations 3 x 30s; Supine belt assisted right hip medial to lateral mobilizations with hip flexed to 45 degrees, grade III, 3 x 30s; Supine belt assisted right hip inferior mobilizations with hip flexed to 90 degrees,  grade III, 3 x 30s; Supine belt assisted right hip inferior mobilizations with hip in partial FABER position grade III, 3 x 30s; Supine R hip AP mobilizations at neutral, grade II-III, 2 x 30s;    Not today Prone lumbar mobilizations L2-L5, grade I, 20s/bout x 1 bout/level; Prone STM with hypervolt to posterior R hip  musculature; Prone STM to R lumbar paraspinals with effleurage and gentle trigger point release;   PATIENT EDUCATION:  Education details: Plan of care Person educated: Patient Education method: Explanation Education comprehension: verbalized understanding and returned demonstration   HOME EXERCISE PROGRAM:  Access Code: [email or web address] URL: [email or web address] Date: 04/26/2022 Prepared by: Roxana Hires  Exercises - Hooklying Bilateral Isometric Clamshell  - 1 x daily - 7 x weekly - 1 sets - 10 reps - 3s hold - Seated Hip Adduction Isometrics with Ball  - 1 x daily - 7 x weekly - 1 sets - 10 reps - 3s hold - Supine Lower Trunk Rotation  - 1 x daily - 7 x weekly - 2 reps - 30s hold - Supine Bridge  - 1 x daily - 7 x weekly - 2 sets - 10 reps - 3s hold - Seated Hip Flexor Stretch  - 1 x daily - 7 x weekly - 3 reps - 30-45s hold - Seated Hamstring Stretch  - 1 x daily - 7 x weekly - 3 reps - 30-45s hold   ASSESSMENT:  CLINICAL IMPRESSION: Continued manual techniques for right hip pain during session today.  His pain continues to be most significant limitation at this point for progressing with strengthening and also is significantly affecting is standing/walking tolerance. He is achieving short term relief from therapy but poor carryover. Education with patient regarding prognosis and plan of care with respect to imaging, injection, and surgical intervention. Plan will be to progress his home program and help pt gain as much independence with self-management while he waits to consult with orthopedics. He will benefit from PT services to address deficits in strength and pain in order to return to full function at home and with leisure activity.   OBJECTIVE IMPAIRMENTS: Abnormal gait, decreased ROM, decreased strength, and pain.   ACTIVITY LIMITATIONS: bending, sitting, and squatting  PARTICIPATION LIMITATIONS: community activity  PERSONAL FACTORS: 3+ comorbidities: CLL,  OSA, OA, and neuropathy  are also affecting patient's functional outcome.   REHAB POTENTIAL: Good  CLINICAL DECISION MAKING: Evolving/moderate complexity  EVALUATION COMPLEXITY: Low   GOALS: Goals reviewed with patient? No  SHORT TERM GOALS: Target date: 04/30/2022  Pt will be independent with HEP in order to improve strength and decrease hip pain to improve pain-free function at home. Baseline:  Goal status: INITIAL   LONG TERM GOALS: Target date: 05/24/2022   Pt will increase FOTO to at least 61 to demonstrate significant improvement in function at home and work related to back pain  Baseline: 03/29/22: 38 Goal status: INITIAL  2.  Pt will decrease worst hip pain by at least 3 points on the NPRS in order to demonstrate clinically significant reduction in hip pain. Baseline: 03/29/22: worst: 8/10; Goal status: INITIAL  3.  Pt will decrease mODI score by at least 13 points in order demonstrate clinically significant reduction in back pain/disability.       Baseline: 03/29/22: To be completed Goal status: INITIAL  4.  Pt will report at least 75% improvement in his hip symptoms in order to return to walking and hiking without notable increase in his pain.  Baseline:  Goal status: INITIAL   PLAN: PT FREQUENCY: 1-2x/week  PT DURATION: 8 weeks  PLANNED INTERVENTIONS: Therapeutic exercises, Therapeutic activity, Neuromuscular re-education, Balance training, Gait training, Patient/Family education, Self Care, Joint mobilization, Joint manipulation, Vestibular training, Canalith repositioning, Orthotic/Fit training, DME instructions, Dry Needling, Electrical stimulation, Spinal manipulation, Spinal mobilization, Cryotherapy, Moist heat, Taping, Traction, Ultrasound, Ionotophoresis '4mg'$ /ml Dexamethasone, Manual therapy, and Re-evaluation.  PLAN FOR NEXT SESSION: pt to complete mODI, continue manual therapy and strengthening for hip, review/modify HEP as needed   Lyndel Safe Nikitta Sobiech PT,  DPT, GCS   Jhony Antrim, PT 04/27/2022, 4:28 PM

## 2022-04-26 ENCOUNTER — Ambulatory Visit: Payer: Medicare Other

## 2022-04-26 DIAGNOSIS — M25551 Pain in right hip: Secondary | ICD-10-CM

## 2022-04-30 NOTE — Therapy (Signed)
OUTPATIENT PHYSICAL THERAPY THORACOLUMBAR/HIP TREATMENT  Patient Name: Chad Gaines MRN: [id number] DOB:15-Jul-1945, 77 y.o., male Today's Date: 05/01/2022  END OF SESSION:  PT End of Session - 05/01/22 0758     Visit Number 8    Number of Visits 17    Date for PT Re-Evaluation 05/27/22    Authorization Type eval: 03/29/22    PT Start Time 0800    PT Stop Time 0845    PT Time Calculation (min) 45 min    Activity Tolerance Patient tolerated treatment well    Behavior During Therapy WFL for tasks assessed/performed            Past Medical History:  Diagnosis Date   Allergy to alpha-gal    GERD (gastroesophageal reflux disease)    Hypertension    Pinched nerve    some numbness R arm, R ankle   Sleep apnea    CPAP   Past Surgical History:  Procedure Laterality Date   APPENDECTOMY     CATARACT EXTRACTION W/PHACO Left 07/03/2021   Procedure: CATARACT EXTRACTION PHACO AND INTRAOCULAR LENS PLACEMENT (IOC) LEFT VIVITY TORIC 3.59 00:27.8;  Surgeon: Eulogio Bear, MD;  Location: [facility];  Service: Ophthalmology;  Laterality: Left;  sleep apnea   CATARACT EXTRACTION W/PHACO Right 07/17/2021   Procedure: CATARACT EXTRACTION PHACO AND INTRAOCULAR LENS PLACEMENT (IOC) RIGHT VIVITY TORIC 4.20 00:33.2;  Surgeon: Eulogio Bear, MD;  Location: [facility];  Service: Ophthalmology;  Laterality: Right;  sleep apnea   NASAL SEPTUM SURGERY     Patient Active Problem List   Diagnosis Date Noted   Adult hypothyroidism 12/25/2014   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   Apnea, sleep 12/25/2014   Essential (primary) hypertension 08/05/2014   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[place]) 03/08/2014   Chronic lymphocytic leukemia ([place]) 03/08/2014   PCP: Emily Filbert MD  REFERRING PROVIDER: Emily Filbert MD  REFERRING DIAG: M25.559 (ICD-10-CM) - Hip pain   RATIONALE FOR EVALUATION AND TREATMENT: Rehabilitation  THERAPY DIAG: Pain in right hip  ONSET DATE: Multiple  years  FOLLOW-UP APPT SCHEDULED WITH REFERRING PROVIDER: Yes   FROM INITIAL EVALUATION SUBJECTIVE:                                                                                                                                                                                         SUBJECTIVE STATEMENT:  R hip pain  PERTINENT HISTORY:  Pt reports R hip pain for multiple years with progressive worsening. Pain was initially in the anterior groin and posterior hip however the anterior hip pain has mostly resolved at this time with medication. He is now limping and walking  with a hiking pole. Pain worsens throughout the day. Pt reports progressive RLE weakness however he has not had any falls. Posterior hip pain has been nonresponsive to anti-inflammatories, oral steroids, gabapentin, and cortisone injections. Plain x-ray showed L5-S1 disc narrowing. Lumbar MRI ordered. "It has started to define my life." He used to like to walk and hike but hasn't been able to lately because of the pain. History of cervical pain and RUE radiculopathy.    PAIN:    Pain Intensity: Present: 0/10, Best: 0/10, Worst: 8/10 Pain location: Anterior and posterior R hip (mostly posterior) Pain Quality: aching posteriorly but becomes sharp with walking. Radiating: Yes, posterolateral RLE down to the ankle Numbness/Tingling: Yes, "prickly" down RLE; Focal Weakness: Yes Aggravating factors: walking, extended sitting, Relieving factors: static standing, changing positions, hasn't tried ice/heat 24-hour pain behavior: gradually worsens as the day progresses History of prior back injury, pain, surgery, or therapy: No Dominant hand: right Imaging: Yes, see history Red flags: Positive: History of cancer (CLL), Negative for bowel/bladder changes, saddle paresthesia, personal history of cancer, h/o spinal tumors, h/o compression fx, h/o abdominal aneurysm, abdominal pain, chills/fever, night sweats, nausea, vomiting, unrelenting  pain, first onset of insidious LBP <21 y/o  PRECAUTIONS: None  WEIGHT BEARING RESTRICTIONS: No  FALLS: Has patient fallen in last 6 months? 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   Hobbies: Reading  Patient Goals: "To walk normally." Pt would like to decrease his pain and be able to walk and hike    OBJECTIVE:  Patient Surveys  FOTO 38, predicted improvement to 61  Cognition Patient is oriented to person, place, and time.  Recent memory is intact.  Remote memory is intact.  Attention span and concentration are intact.  Expressive speech is intact.  Patient's fund of knowledge is within normal limits for educational level.    Gross Musculoskeletal Assessment Tremor: None Bulk: Normal Tone: Normal No visible step-off along spinal column, no signs of scoliosis  GAIT: Antalgic gait on the RLE. Pt ambulates with single hiking pole  Posture: Lumbar lordosis: WNL Iliac crest height: Equal bilaterally Lumbar lateral shift: Negative  AROM AROM (Normal range in degrees) AROM   Lumbar   Flexion (65) Moderate segmental loss, no pain  Extension (30) Moderate to severe loss of extension, anterior R hip pain  Right lateral flexion (25) Moderate loss, anterior R hip pain  Left lateral flexion (25) Moderate loss, no pain  Right rotation (30) Min loss, no pain  Left rotation (30) Min loss, no pain      Hip Right Left  Flexion (125) Limited by pain around 90 degrees  WNL  Extension (15)    Abduction (40)    Adduction     Internal Rotation (45) 10* 28  External Rotation (45) 55 50      Knee    Flexion (135) WNL WNL  Extension (0) WNL WNL      Ankle    Dorsiflexion  (20)    Plantarflexion (50)    Inversion (35)    Eversion (15)    (* = pain; Blank rows = not tested)  LE MMT: MMT (out of 5) Right  Left   Hip flexion 4* 5  Hip extension    Hip abduction (seated) 5* 5  Hip adduction (seated) 5* 5  Hip internal rotation 5* 5  Hip external rotation 5* 5  Knee flexion (seated) 4+* 5  Knee extension 5 5  Ankle dorsiflexion 5 5  Ankle plantarflexion Strong Strong  Ankle inversion    Ankle eversion    (* = pain; Blank rows = not tested)  Sensation Decreased sensation to light touch RLE L5-S1. Otherwise WNL BLE. Proprioception, stereognosis, and hot/cold testing deferred on this date.  Reflexes R/L Knee Jerk (L3/4): 1+/1+  Ankle Jerk (S1/2): 1+/1+   Muscle Length Hamstrings: R: Positive for tightness at 52 degrees L: Positive for tightness at 50 degrees [place] (quadriceps): R: Not examined L: Not examined Thomas (hip flexors): R: Not examined L: Not examined Ober: R: Not examined L: Not examined  Palpation Location Right Left         Lumbar paraspinals    Quadratus Lumborum    Gluteus Maximus 1   Gluteus Medius 1   Deep hip external rotators 1   PSIS 1   Fortin's Area (SIJ) 1   Greater Trochanter 1   (Blank rows = not tested) Graded on 0-4 scale (0 = no pain, 1 = pain, 2 = pain with wincing/grimacing/flinching, 3 = pain with withdrawal, 4 = unwilling to allow palpation)  Passive Accessory Intervertebral Motion Deferred  Special Tests Lumbar Radiculopathy and Discogenic: Centralization and Peripheralization (SN 92, -LR 0.12): Negative, worsening hip pain but symptoms do not centralize or peripheralize Slump (SN 83, -LR 0.32): R: Negative L: Negative SLR (SN 92, -LR 0.29): R: Negative L:  Negative Crossed SLR (SP 90): R: Negative L: Negative  Facet Joint: Extension-Rotation (SN 100, -LR 0.0): R: Negative L: Negative  Lumbar Foraminal Stenosis: Lumbar quadrant (SN 70): R: Negative L: Negative  Hip: FABER (SN 81): R: Positive  L: Negative FADIR (SN 94): R: Positive L: Negative Hip scour (SN 50): R: Negative L: Negative  SIJ:  Thigh Thrust (SN 88, -LR 0.18) : R: Not examined L: Not examined  Piriformis Syndrome: FAIR Test (SN 88, SP 83): R: Not examined L: Not examined  Functional Tasks Deferred  Beighton scale:  Deferred   TODAY'S TREATMENT:   SUBJECTIVE: Patient arrives reporting considerable R hip pain. Pt continues to note short term relief after therapy however stiffness returns after riding in the car. He has an appointment to see Dr. Sabra Heck later today and is wondering if having an MRI might be helpful for the orthopedist when he arrives for his appointment.   PAIN: R hip pain reported upon arrival, 5/10;   Ther-ex  NuStep L1-2 x 7 minutes for LE strengthening and R hip AAROM during interval history; Extensive conversation with patient regarding plan of care; Hooklying clams with manual resistance from therapist x 10; Hooklying adductor squeeze with manual resistance from therapist x 10; L sidelying R hip clam with manual resistance x 10; L sidelying R hip straight leg abduction with gentle assist from therapist;   Manual Therapy  Supine R hip AP mobilizations at neutral, grade II-III, 4 x 30s; Supine belt assisted right hip long axis distraction mobilizations 3 x 30s; Supine belt assisted right hip medial to lateral mobilizations with hip flexed to 45 degrees, grade III, 3 x 30s; Supine belt assisted right hip inferior mobilizations with hip flexed to 90 degrees, grade III, 3 x 30s; Supine belt assisted right hip inferior mobilizations with hip in partial FABER position grade III, 3 x  30s, notable improvement in pain-free R hip ER today;    Trigger Point Dry Needling (TDN), unbilled Education previously performed with patient regarding potential benefit of TDN. Previously reviewed precautions and risks with patient. Pt provided verbal consent to treatment. TDN performed to R gluteus  medius with 3, 0.30 x 60 single needle placements with deep ache reported. Pistoning technique utilized.    Not today Prone lumbar mobilizations L2-L5, grade I, 20s/bout x 1 bout/level; Prone STM with hypervolt to posterior R hip musculature; Prone STM to R lumbar paraspinals with effleurage and gentle trigger point release;   PATIENT EDUCATION:  Education details: Plan of care Person educated: Patient Education method: Explanation Education comprehension: verbalized understanding and returned demonstration   HOME EXERCISE PROGRAM:  Access Code: [email or web address] URL: [email or web address] Date: 04/26/2022 Prepared by: Roxana Hires  Exercises - Hooklying Bilateral Isometric Clamshell  - 1 x daily - 7 x weekly - 1 sets - 10 reps - 3s hold - Seated Hip Adduction Isometrics with Ball  - 1 x daily - 7 x weekly - 1 sets - 10 reps - 3s hold - Supine Lower Trunk Rotation  - 1 x daily - 7 x weekly - 2 reps - 30s hold - Supine Bridge  - 1 x daily - 7 x weekly - 2 sets - 10 reps - 3s hold - Seated Hip Flexor Stretch  - 1 x daily - 7 x weekly - 3 reps - 30-45s hold - Seated Hamstring Stretch  - 1 x daily - 7 x weekly - 3 reps - 30-45s hold   ASSESSMENT:  CLINICAL IMPRESSION: Continued manual techniques for right hip pain during session today. Performed additional R hip AP mobilizations which pt is able to tolerate with less pain. Slow progression of strengthening today. Repeated TDN but focused on gluteus medius. His pain continues to be most significant limitation at this point for progressing with strengthening and also is significantly affecting is standing/walking tolerance. He is achieving short term relief from therapy but poor carryover. Discussed patient's conversation with Dr. Sabra Heck this afternoon about considering a R hip MRI in order to arrive at orthopedics with a report in hand. At patient's request provided additional names of orthopedic surgeons in the area. Plan will be  to progress his home program and help pt gain as much independence with self-management while he waits to consult with orthopedics. He will benefit from PT services to address deficits in strength and pain in order to return to full function at home and with leisure activity.   OBJECTIVE IMPAIRMENTS: Abnormal gait, decreased ROM, decreased strength, and pain.   ACTIVITY LIMITATIONS: bending, sitting, and squatting  PARTICIPATION LIMITATIONS: community activity  PERSONAL FACTORS: 3+ comorbidities: CLL, OSA, OA, and neuropathy  are also affecting patient's functional outcome.   REHAB POTENTIAL: Good  CLINICAL DECISION MAKING: Evolving/moderate complexity  EVALUATION COMPLEXITY: Low   GOALS: Goals reviewed with patient? No  SHORT TERM GOALS: Target date: 04/30/2022  Pt will be independent with HEP in order to improve strength and decrease hip pain to improve pain-free function at home. Baseline:  Goal status: INITIAL   LONG TERM GOALS: Target date: 05/24/2022   Pt will increase FOTO to at least 61 to demonstrate significant improvement in function at home and work related to back pain  Baseline: 03/29/22: 38 Goal status: INITIAL  2.  Pt will decrease worst hip pain by at least 3 points on the NPRS in order to demonstrate clinically significant reduction in hip pain.  Baseline: 03/29/22: worst: 8/10; Goal status: INITIAL  3.  Pt will decrease mODI score by at least 13 points in order demonstrate clinically significant reduction in back pain/disability.       Baseline: 03/29/22: To be completed Goal status: INITIAL  4.  Pt will report at least 75% improvement in his hip symptoms in order to return to walking and hiking without notable increase in his pain.  Baseline:  Goal status: INITIAL   PLAN: PT FREQUENCY: 1-2x/week  PT DURATION: 8 weeks  PLANNED INTERVENTIONS: Therapeutic exercises, Therapeutic activity, Neuromuscular re-education, Balance training, Gait training,  Patient/Family education, Self Care, Joint mobilization, Joint manipulation, Vestibular training, Canalith repositioning, Orthotic/Fit training, DME instructions, Dry Needling, Electrical stimulation, Spinal manipulation, Spinal mobilization, Cryotherapy, Moist heat, Taping, Traction, Ultrasound, Ionotophoresis '4mg'$ /ml Dexamethasone, Manual therapy, and Re-evaluation.  PLAN FOR NEXT SESSION: pt to complete mODI, continue manual therapy and strengthening for hip, review/modify HEP as needed   Lyndel Safe Lindon Kiel PT, DPT, GCS   Chad Gaines, PT 05/01/2022, 11:35 AM

## 2022-05-01 ENCOUNTER — Ambulatory Visit: Payer: Medicare Other

## 2022-05-01 DIAGNOSIS — M25551 Pain in right hip: Secondary | ICD-10-CM | POA: Diagnosis not present

## 2022-05-03 ENCOUNTER — Ambulatory Visit: Payer: Medicare Other

## 2022-05-03 DIAGNOSIS — M25551 Pain in right hip: Secondary | ICD-10-CM

## 2022-05-03 NOTE — Therapy (Signed)
OUTPATIENT PHYSICAL THERAPY THORACOLUMBAR/HIP TREATMENT  Patient Name: Chad Gaines MRN: [id number] DOB:[birth year], 77 y.o., male Today's Date: 05/03/2022  END OF SESSION:  PT End of Session - 05/03/22 0759     Visit Number 9    Number of Visits 17    Date for PT Re-Evaluation 05/27/22    Authorization Type eval: 03/29/22    PT Start Time 0800    PT Stop Time 0845    PT Time Calculation (min) 45 min    Activity Tolerance Patient tolerated treatment well    Behavior During Therapy WFL for tasks assessed/performed            Past Medical History:  Diagnosis Date   Allergy to alpha-gal    GERD (gastroesophageal reflux disease)    Hypertension    Pinched nerve    some numbness R arm, R ankle   Sleep apnea    CPAP   Past Surgical History:  Procedure Laterality Date   APPENDECTOMY     CATARACT EXTRACTION W/PHACO Left 07/03/2021   Procedure: CATARACT EXTRACTION PHACO AND INTRAOCULAR LENS PLACEMENT (IOC) LEFT VIVITY TORIC 3.59 00:27.8;  Surgeon: Eulogio Bear, MD;  Location: [place];  Service: Ophthalmology;  Laterality: Left;  sleep apnea   CATARACT EXTRACTION W/PHACO Right 07/17/2021   Procedure: CATARACT EXTRACTION PHACO AND INTRAOCULAR LENS PLACEMENT (IOC) RIGHT VIVITY TORIC 4.20 00:33.2;  Surgeon: Eulogio Bear, MD;  Location: [place];  Service: Ophthalmology;  Laterality: Right;  sleep apnea   NASAL SEPTUM SURGERY     Patient Active Problem List   Diagnosis Date Noted   Adult hypothyroidism 12/25/2014   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   Apnea, sleep 12/25/2014   Essential (primary) hypertension 08/05/2014   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[place]) 03/08/2014   Chronic lymphocytic leukemia ([place]) 03/08/2014   PCP: Emily Filbert MD  REFERRING PROVIDER: Emily Filbert MD  REFERRING DIAG: M25.559 (ICD-10-CM) - Hip pain   RATIONALE FOR EVALUATION AND TREATMENT: Rehabilitation  THERAPY DIAG: Pain in right hip  ONSET DATE: Multiple  years  FOLLOW-UP APPT SCHEDULED WITH REFERRING PROVIDER: Yes   FROM INITIAL EVALUATION SUBJECTIVE:                                                                                                                                                                                         SUBJECTIVE STATEMENT:  R hip pain  PERTINENT HISTORY:  Pt reports R hip pain for multiple years with progressive worsening. Pain was initially in the anterior groin and posterior hip however the anterior hip pain has mostly resolved at this time with medication. He is now limping and walking  with a hiking pole. Pain worsens throughout the day. Pt reports progressive RLE weakness however he has not had any falls. Posterior hip pain has been nonresponsive to anti-inflammatories, oral steroids, gabapentin, and cortisone injections. Plain x-ray showed L5-S1 disc narrowing. Lumbar MRI ordered. "It has started to define my life." He used to like to walk and hike but hasn't been able to lately because of the pain. History of cervical pain and RUE radiculopathy.    PAIN:    Pain Intensity: Present: 0/10, Best: 0/10, Worst: 8/10 Pain location: Anterior and posterior R hip (mostly posterior) Pain Quality: aching posteriorly but becomes sharp with walking. Radiating: Yes, posterolateral RLE down to the ankle Numbness/Tingling: Yes, "prickly" down RLE; Focal Weakness: Yes Aggravating factors: walking, extended sitting, Relieving factors: static standing, changing positions, hasn't tried ice/heat 24-hour pain behavior: gradually worsens as the day progresses History of prior back injury, pain, surgery, or therapy: No Dominant hand: right Imaging: Yes, see history Red flags: Positive: History of cancer (CLL), Negative for bowel/bladder changes, saddle paresthesia, personal history of cancer, h/o spinal tumors, h/o compression fx, h/o abdominal aneurysm, abdominal pain, chills/fever, night sweats, nausea, vomiting, unrelenting  pain, first onset of insidious LBP <21 y/o  PRECAUTIONS: None  WEIGHT BEARING RESTRICTIONS: No  FALLS: Has patient fallen in last 6 months? 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   Hobbies: Reading  Patient Goals: "To walk normally." Pt would like to decrease his pain and be able to walk and hike    OBJECTIVE:  Patient Surveys  FOTO 38, predicted improvement to 54  Cognition Patient is oriented to person, place, and time.  Recent memory is intact.  Remote memory is intact.  Attention span and concentration are intact.  Expressive speech is intact.  Patient's fund of knowledge is within normal limits for educational level.    Gross Musculoskeletal Assessment Tremor: None Bulk: Normal Tone: Normal No visible step-off along spinal column, no signs of scoliosis  GAIT: Antalgic gait on the RLE. Pt ambulates with single hiking pole  Posture: Lumbar lordosis: WNL Iliac crest height: Equal bilaterally Lumbar lateral shift: Negative  AROM AROM (Normal range in degrees) AROM   Lumbar   Flexion (65) Moderate segmental loss, no pain  Extension (30) Moderate to severe loss of extension, anterior R hip pain  Right lateral flexion (25) Moderate loss, anterior R hip pain  Left lateral flexion (25) Moderate loss, no pain  Right rotation (30) Min loss, no pain  Left rotation (30) Min loss, no pain      Hip Right Left  Flexion (125) Limited by pain around 90 degrees  WNL  Extension (15)    Abduction (40)    Adduction     Internal Rotation (45) 10* 28  External Rotation (45) 55 50      Knee    Flexion (135) WNL WNL  Extension (0) WNL WNL      Ankle    Dorsiflexion  (20)    Plantarflexion (50)    Inversion (35)    Eversion (15)    (* = pain; Blank rows = not tested)  LE MMT: MMT (out of 5) Right  Left   Hip flexion 4* 5  Hip extension    Hip abduction (seated) 5* 5  Hip adduction (seated) 5* 5  Hip internal rotation 5* 5  Hip external rotation 5* 5  Knee flexion (seated) 4+* 5  Knee extension 5 5  Ankle dorsiflexion 5 5  Ankle plantarflexion Strong Strong  Ankle inversion    Ankle eversion    (* = pain; Blank rows = not tested)  Sensation Decreased sensation to light touch RLE L5-S1. Otherwise WNL BLE. Proprioception, stereognosis, and hot/cold testing deferred on this date.  Reflexes R/L Knee Jerk (L3/4): 1+/1+  Ankle Jerk (S1/2): 1+/1+   Muscle Length Hamstrings: R: Positive for tightness at 52 degrees L: Positive for tightness at 50 degrees [place] (quadriceps): R: Not examined L: Not examined Thomas (hip flexors): R: Not examined L: Not examined Ober: R: Not examined L: Not examined  Palpation Location Right Left         Lumbar paraspinals    Quadratus Lumborum    Gluteus Maximus 1   Gluteus Medius 1   Deep hip external rotators 1   PSIS 1   Fortin's Area (SIJ) 1   Greater Trochanter 1   (Blank rows = not tested) Graded on 0-4 scale (0 = no pain, 1 = pain, 2 = pain with wincing/grimacing/flinching, 3 = pain with withdrawal, 4 = unwilling to allow palpation)  Passive Accessory Intervertebral Motion Deferred  Special Tests Lumbar Radiculopathy and Discogenic: Centralization and Peripheralization (SN 92, -LR 0.12): Negative, worsening hip pain but symptoms do not centralize or peripheralize Slump (SN 83, -LR 0.32): R: Negative L: Negative SLR (SN 92, -LR 0.29): R: Negative L:  Negative Crossed SLR (SP 90): R: Negative L: Negative  Facet Joint: Extension-Rotation (SN 100, -LR 0.0): R: Negative L: Negative  Lumbar Foraminal Stenosis: Lumbar quadrant (SN 70): R: Negative L: Negative  Hip: FABER (SN 81): R: Positive  L: Negative FADIR (SN 94): R: Positive L: Negative Hip scour (SN 50): R: Negative L: Negative  SIJ:  Thigh Thrust (SN 88, -LR 0.18) : R: Not examined L: Not examined  Piriformis Syndrome: FAIR Test (SN 88, SP 83): R: Not examined L: Not examined  Functional Tasks Deferred  Beighton scale:  Deferred   TODAY'S TREATMENT:   SUBJECTIVE: Patient arrives reporting continued R hip pain. He continues to note short term relief after therapy. He saw Dr. Sabra Heck who is going to refer him to Dr. Landis Gandy at [facility]. Dr. Sabra Heck would like to defer to Dr. Landis Gandy to order an additional imaging he needs.    PAIN: R hip pain reported upon arrival, 5/10;   Ther-ex  NuStep L1-2 x 10 minutes for LE strengthening and R hip AAROM during interval history and while discussing plan of care; Hooklying clams with manual resistance from therapist x 10; Hooklying adductor squeeze with manual resistance from therapist x 10; Hooklying bridges x 10; Supine R heel slide with manually resisted extension x 10; Supine manually resisted straight knee R hip abduction x 10; L sidelying R hip straight leg abduction 2 x 10; Seated clams with blue tband 2 x 15; Seated LAQ with 5# ankle weights x 15 BLE; HEP updated and reviewed with patient;   Manual Therapy  Supine belt assisted right hip long axis distraction mobilizations 3 x 30s; Supine belt assisted right hip medial to lateral mobilizations with hip flexed to 45 degrees, grade III, 3 x 30s; Supine belt assisted right hip inferior mobilizations with hip flexed to 90 degrees, grade III, 3 x 30s; Supine  belt assisted right hip inferior mobilizations with hip in partial FABER position grade III, 3 x 30s,   Not today Prone lumbar mobilizations L2-L5, grade I, 20s/bout x 1 bout/level; Prone STM with hypervolt to posterior R hip musculature; Prone STM to R lumbar paraspinals with effleurage and gentle trigger point release;   PATIENT EDUCATION:  Education  details: Plan of care Person educated: Patient Education method: Explanation Education comprehension: verbalized understanding and returned demonstration   HOME EXERCISE PROGRAM:  Access Code: [email or web address] URL: [email or web address] Date: 05/03/2022 Prepared by: Roxana Hires  Exercises - Supine Lower Trunk Rotation  - 1 x daily - 7 x weekly - 2 reps - 30s hold - Sidelying Hip Abduction  - 1 x daily - 7 x weekly - 2 sets - 10 reps - 3s hold - Supine Bridge  - 1 x daily - 7 x weekly - 2 sets - 10 reps - 3s hold - Seated Hip Abduction with Resistance  - 1 x daily - 7 x weekly - 2 sets - 10 reps - 3s hold - Seated Hip Adduction Isometrics with Ball  - 1 x daily - 7 x weekly - 2 sets - 10 reps - 3s hold - Mini Squat with Counter Support  - 1 x daily - 7 x weekly - 2 sets - 10 reps - Seated Hip Flexor Stretch  - 1 x daily - 7 x weekly - 3 reps - 30-45s hold - Seated Hamstring Stretch  - 1 x daily - 7 x weekly - 3 reps - 30-45s hold   ASSESSMENT:  CLINICAL IMPRESSION: Continued manual techniques for right hip pain during session today. Slow progression of strengthening today which will be the primary focus of future sessions. He is achieving short term relief from therapy but poor carryover. Plan will be to progress his home program and help pt gain as much independence with self-management while he waits to consult with orthopedics. He will benefit from PT services while awaiting to consult orthopedics in order to address deficits in strength and pain so he can return to full function at home and with leisure activities.   OBJECTIVE IMPAIRMENTS: Abnormal gait, decreased ROM, decreased strength, and pain.   ACTIVITY LIMITATIONS: bending, sitting, and squatting  PARTICIPATION LIMITATIONS: community activity  PERSONAL FACTORS: 3+ comorbidities: CLL, OSA, OA, and neuropathy  are also affecting patient's functional outcome.   REHAB POTENTIAL: Good  CLINICAL DECISION MAKING:  Evolving/moderate complexity  EVALUATION COMPLEXITY: Low   GOALS: Goals reviewed with patient? No  SHORT TERM GOALS: Target date: 04/30/2022  Pt will be independent with HEP in order to improve strength and decrease hip pain to improve pain-free function at home. Baseline:  Goal status: INITIAL   LONG TERM GOALS: Target date: 05/24/2022   Pt will increase FOTO to at least 61 to demonstrate significant improvement in function at home and work related to back pain  Baseline: 03/29/22: 38 Goal status: INITIAL  2.  Pt will decrease worst hip pain by at least 3 points on the NPRS in order to demonstrate clinically significant reduction in hip pain. Baseline: 03/29/22: worst: 8/10; Goal status: INITIAL  3.  Pt will decrease mODI score by at least 13 points in order demonstrate clinically significant reduction in back pain/disability.       Baseline: 03/29/22: To be completed Goal status: DISCONTINUED  4.  Pt will report at least 75% improvement in his hip symptoms in order to return to walking and hiking without notable increase  in his pain.  Baseline:  Goal status: INITIAL   PLAN: PT FREQUENCY: 1-2x/week  PT DURATION: 8 weeks  PLANNED INTERVENTIONS: Therapeutic exercises, Therapeutic activity, Neuromuscular re-education, Balance training, Gait training, Patient/Family education, Self Care, Joint mobilization, Joint manipulation, Vestibular training, Canalith repositioning, Orthotic/Fit training, DME instructions, Dry Needling, Electrical stimulation, Spinal manipulation, Spinal mobilization, Cryotherapy, Moist heat, Taping, Traction, Ultrasound, Ionotophoresis '4mg'$ /ml Dexamethasone, Manual therapy, and Re-evaluation.  PLAN FOR NEXT SESSION: continue manual therapy and strengthening for hip, review/modify HEP as needed   Lyndel Safe Mamye Bolds PT, DPT, GCS   Greidys Deland, PT 05/03/2022, 6:04 PM

## 2022-05-04 NOTE — Therapy (Signed)
OUTPATIENT PHYSICAL THERAPY THORACOLUMBAR/HIP TREATMENT/PROGRESS NOTE  Patient Name: Chad Gaines MRN: [id number] DOB:Aug 05, 1945, 77 y.o., male Today's Date: 05/08/2022  END OF SESSION:  PT End of Session - 05/08/22 0757     Visit Number 10    Number of Visits 17    Date for PT Re-Evaluation 05/27/22    Authorization Type eval: 03/29/22    PT Start Time 0800    PT Stop Time 0845    PT Time Calculation (min) 45 min    Activity Tolerance Patient tolerated treatment well    Behavior During Therapy WFL for tasks assessed/performed            Past Medical History:  Diagnosis Date   Allergy to alpha-gal    GERD (gastroesophageal reflux disease)    Hypertension    Pinched nerve    some numbness R arm, R ankle   Sleep apnea    CPAP   Past Surgical History:  Procedure Laterality Date   APPENDECTOMY     CATARACT EXTRACTION W/PHACO Left 07/03/2021   Procedure: CATARACT EXTRACTION PHACO AND INTRAOCULAR LENS PLACEMENT (IOC) LEFT VIVITY TORIC 3.59 00:27.8;  Surgeon: Eulogio Bear, MD;  Location: [place];  Service: Ophthalmology;  Laterality: Left;  sleep apnea   CATARACT EXTRACTION W/PHACO Right 07/17/2021   Procedure: CATARACT EXTRACTION PHACO AND INTRAOCULAR LENS PLACEMENT (IOC) RIGHT VIVITY TORIC 4.20 00:33.2;  Surgeon: Eulogio Bear, MD;  Location: [place];  Service: Ophthalmology;  Laterality: Right;  sleep apnea   NASAL SEPTUM SURGERY     Patient Active Problem List   Diagnosis Date Noted   Adult hypothyroidism 12/25/2014   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   Apnea, sleep 12/25/2014   Essential (primary) hypertension 08/05/2014   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[place]) 03/08/2014   Chronic lymphocytic leukemia ([place]) 03/08/2014   PCP: Emily Filbert MD  REFERRING PROVIDER: Emily Filbert MD  REFERRING DIAG: M25.559 (ICD-10-CM) - Hip pain   RATIONALE FOR EVALUATION AND TREATMENT: Rehabilitation  THERAPY DIAG: Pain in right hip  ONSET DATE:  Multiple years  FOLLOW-UP APPT SCHEDULED WITH REFERRING PROVIDER: Yes   FROM INITIAL EVALUATION SUBJECTIVE:                                                                                                                                                                                         SUBJECTIVE STATEMENT:  R hip pain  PERTINENT HISTORY:  Pt reports R hip pain for multiple years with progressive worsening. Pain was initially in the anterior groin and posterior hip however the anterior hip pain has mostly resolved at this time with medication. He is now limping and  walking with a hiking pole. Pain worsens throughout the day. Pt reports progressive RLE weakness however he has not had any falls. Posterior hip pain has been nonresponsive to anti-inflammatories, oral steroids, gabapentin, and cortisone injections. Plain x-ray showed L5-S1 disc narrowing. Lumbar MRI ordered. "It has started to define my life." He used to like to walk and hike but hasn't been able to lately because of the pain. History of cervical pain and RUE radiculopathy.    PAIN:    Pain Intensity: Present: 0/10, Best: 0/10, Worst: 8/10 Pain location: Anterior and posterior R hip (mostly posterior) Pain Quality: aching posteriorly but becomes sharp with walking. Radiating: Yes, posterolateral RLE down to the ankle Numbness/Tingling: Yes, "prickly" down RLE; Focal Weakness: Yes Aggravating factors: walking, extended sitting, Relieving factors: static standing, changing positions, hasn't tried ice/heat 24-hour pain behavior: gradually worsens as the day progresses History of prior back injury, pain, surgery, or therapy: No Dominant hand: right Imaging: Yes, see history Red flags: Positive: History of cancer (CLL), Negative for bowel/bladder changes, saddle paresthesia, personal history of cancer, h/o spinal tumors, h/o compression fx, h/o abdominal aneurysm, abdominal pain, chills/fever, night sweats, nausea, vomiting,  unrelenting pain, first onset of insidious LBP <21 y/o  PRECAUTIONS: None  WEIGHT BEARING RESTRICTIONS: No  FALLS: Has patient fallen in last 6 months? 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   Hobbies: Reading  Patient Goals: "To walk normally." Pt would like to decrease his pain and be able to walk and hike    OBJECTIVE:  Patient Surveys  FOTO 38, predicted improvement to 56  Cognition Patient is oriented to person, place, and time.  Recent memory is intact.  Remote memory is intact.  Attention span and concentration are intact.  Expressive speech is intact.  Patient's fund of knowledge is within normal limits for educational level.    Gross Musculoskeletal Assessment Tremor: None Bulk: Normal Tone: Normal No visible step-off along spinal column, no signs of scoliosis  GAIT: Antalgic gait on the RLE. Pt ambulates with single hiking pole  Posture: Lumbar lordosis: WNL Iliac crest height: Equal bilaterally Lumbar lateral shift: Negative  AROM AROM (Normal range in degrees) AROM   Lumbar   Flexion (65) Moderate segmental loss, no pain  Extension (30) Moderate to severe loss of extension, anterior R hip pain  Right lateral flexion (25) Moderate loss, anterior R hip pain  Left lateral flexion (25) Moderate loss, no pain  Right rotation (30) Min loss, no pain  Left rotation (30) Min loss, no pain      Hip Right Left  Flexion (125) Limited by pain around 90 degrees  WNL  Extension (15)    Abduction (40)    Adduction     Internal Rotation (45) 10* 28  External Rotation (45) 55 50      Knee    Flexion (135) WNL WNL  Extension (0) WNL WNL      Ankle     Dorsiflexion (20)    Plantarflexion (50)    Inversion (35)    Eversion (15)    (* = pain; Blank rows = not tested)  LE MMT: MMT (out of 5) Right  Left   Hip flexion 4* 5  Hip extension    Hip abduction (seated) 5* 5  Hip adduction (seated) 5* 5  Hip internal rotation 5* 5  Hip external rotation 5* 5  Knee flexion (seated) 4+* 5  Knee extension 5 5  Ankle dorsiflexion 5 5  Ankle plantarflexion Strong Strong  Ankle inversion    Ankle eversion    (* = pain; Blank rows = not tested)  Sensation Decreased sensation to light touch RLE L5-S1. Otherwise WNL BLE. Proprioception, stereognosis, and hot/cold testing deferred on this date.  Reflexes R/L Knee Jerk (L3/4): 1+/1+  Ankle Jerk (S1/2): 1+/1+   Muscle Length Hamstrings: R: Positive for tightness at 52 degrees L: Positive for tightness at 50 degrees [place] (quadriceps): R: Not examined L: Not examined Thomas (hip flexors): R: Not examined L: Not examined Ober: R: Not examined L: Not examined  Palpation Location Right Left         Lumbar paraspinals    Quadratus Lumborum    Gluteus Maximus 1   Gluteus Medius 1   Deep hip external rotators 1   PSIS 1   Fortin's Area (SIJ) 1   Greater Trochanter 1   (Blank rows = not tested) Graded on 0-4 scale (0 = no pain, 1 = pain, 2 = pain with wincing/grimacing/flinching, 3 = pain with withdrawal, 4 = unwilling to allow palpation)  Passive Accessory Intervertebral Motion Deferred  Special Tests Lumbar Radiculopathy and Discogenic: Centralization and Peripheralization (SN 92, -LR 0.12): Negative, worsening hip pain but symptoms do not centralize or peripheralize Slump (SN 83, -LR 0.32): R: Negative L: Negative SLR (SN 92, -LR 0.29): R: Negative L:  Negative Crossed SLR (SP 90): R: Negative L: Negative  Facet Joint: Extension-Rotation (SN 100, -LR 0.0): R: Negative L: Negative  Lumbar Foraminal Stenosis: Lumbar quadrant (SN 70): R: Negative L: Negative  Hip: FABER (SN  81): R: Positive L: Negative FADIR (SN 94): R: Positive L: Negative Hip scour (SN 50): R: Negative L: Negative  SIJ:  Thigh Thrust (SN 88, -LR 0.18) : R: Not examined L: Not examined  Piriformis Syndrome: FAIR Test (SN 88, SP 83): R: Not examined L: Not examined  Functional Tasks Deferred  Beighton scale:  Deferred   TODAY'S TREATMENT:   SUBJECTIVE: Patient arrives reporting continued R hip pain. It has progressively worsened to the point where it is causing him notable nighttime pain. He has an appointment to see Dr. Landis Gandy at [facility] on 05/23/22.    PAIN: R hip pain reported upon arrival, not asked to rate today;   Ther-ex  NuStep L1-3 x 10 minutes for LE strengthening and R hip AAROM during interval history and while discussing plan of care; Hooklying marches with blue tband 2 x 10; Hooklying clams with blue tband 2 x 10; Hooklying adductor squeeze with manual resistance from therapist 2 x 10; Hooklying bridges x 10; Total Gym Level 22 squats 2 x 10, no pain; Seated LAQ with 4# ankle weights (AW) x 15 BLE; Seated hamstring curls with blue tband x 15 RLE; Standing marches with 4# AW x 15 BLE; Standing hip abduction with 4# AW x 15 BLE; Standing heel raises x 10 BLE;   Manual Therapy  Supine R hip AP mobilizations, grade I-II, 2 x 30s; Supine manual right hip long axis distraction mobilizations 2 x 30s; Supine right hip inferior mobilizations with hip flexed to 45 degrees, grade III, 2 x 30s;   Not today Prone lumbar mobilizations L2-L5, grade I, 20s/bout x 1 bout/level; Prone STM  with hypervolt to posterior R hip musculature; Prone STM to R lumbar paraspinals with effleurage and gentle trigger point release;   PATIENT EDUCATION:  Education details: Plan of care Person educated: Patient Education method: Explanation Education comprehension: verbalized understanding and returned demonstration   HOME EXERCISE PROGRAM:  Access Code: [email or web address] URL:  [email or web address] Date: 05/03/2022 Prepared by: Roxana Hires  Exercises - Supine Lower Trunk Rotation  - 1 x daily - 7 x weekly - 2 reps - 30s hold - Sidelying Hip Abduction  - 1 x daily - 7 x weekly - 2 sets - 10 reps - 3s hold - Supine Bridge  - 1 x daily - 7 x weekly - 2 sets - 10 reps - 3s hold - Seated Hip Abduction with Resistance  - 1 x daily - 7 x weekly - 2 sets - 10 reps - 3s hold - Seated Hip Adduction Isometrics with Ball  - 1 x daily - 7 x weekly - 2 sets - 10 reps - 3s hold - Mini Squat with Counter Support  - 1 x daily - 7 x weekly - 2 sets - 10 reps - Seated Hip Flexor Stretch  - 1 x daily - 7 x weekly - 3 reps - 30-45s hold - Seated Hamstring Stretch  - 1 x daily - 7 x weekly - 3 reps - 30-45s hold   ASSESSMENT:  CLINICAL IMPRESSION: Continued manual techniques for right hip pain during session today. Slow progression of strengthening today which will be the primary focus of future sessions. Pt is able to perform standing exercises today but does have notable increase in pain with marches and hip abduction. Pt encouraged to discontinue sidelying hip abduction as this is notably painful for him and instead encouraged him to attempt standing hip abduction with band around knees. His hip pain is gradually worsening and he has an appt with Dr. Landis Gandy at [facility] in a couple weeks. Plan is to progress strengthening as much as possible while he waits for his surgical consult. Pt is going to bring a picture of his townhouse complex gym to an upcoming appointment so we can discuss gym-based strengthening. He will benefit from PT services while awaiting to consult orthopedics in order to address deficits in strength and pain so he can return to full function at home and with leisure activities.   OBJECTIVE IMPAIRMENTS: Abnormal gait, decreased ROM, decreased strength, and pain.   ACTIVITY LIMITATIONS: bending, sitting, and squatting  PARTICIPATION LIMITATIONS:  community activity  PERSONAL FACTORS: 3+ comorbidities: CLL, OSA, OA, and neuropathy  are also affecting patient's functional outcome.   REHAB POTENTIAL: Good  CLINICAL DECISION MAKING: Evolving/moderate complexity  EVALUATION COMPLEXITY: Low   GOALS: Goals reviewed with patient? No  SHORT TERM GOALS: Target date: 04/30/2022  Pt will be independent with HEP in order to improve strength and decrease hip pain to improve pain-free function at home. Baseline:  Goal status: ONGOING   LONG TERM GOALS: Target date: 05/24/2022   Pt will increase FOTO to at least 61 to demonstrate significant improvement in function at home and work related to back pain  Baseline: 03/29/22: 38 Goal status: DEFERRED  2.  Pt will decrease worst hip pain by at least 3 points on the NPRS in order to demonstrate clinically significant reduction in hip pain. Baseline: 03/29/22: worst: 8/10; 05/08/22: worst: 8/10; Goal status: ONGOING  3.  Pt will decrease mODI score by at least 13 points in order demonstrate clinically significant reduction in back  pain/disability.       Baseline: 03/29/22: To be completed Goal status: DISCONTINUED  4.  Pt will report at least 75% improvement in his hip symptoms in order to return to walking and hiking without notable increase in his pain.  Baseline: 05/08/22: Gradualy worsening Goal status: ONGOING   PLAN: PT FREQUENCY: 1-2x/week  PT DURATION: 8 weeks  PLANNED INTERVENTIONS: Therapeutic exercises, Therapeutic activity, Neuromuscular re-education, Balance training, Gait training, Patient/Family education, Self Care, Joint mobilization, Joint manipulation, Vestibular training, Canalith repositioning, Orthotic/Fit training, DME instructions, Dry Needling, Electrical stimulation, Spinal manipulation, Spinal mobilization, Cryotherapy, Moist heat, Taping, Traction, Ultrasound, Ionotophoresis '4mg'$ /ml Dexamethasone, Manual therapy, and Re-evaluation.  PLAN FOR NEXT SESSION:  continue manual therapy and strengthening for hip, review/modify HEP as needed   Lyndel Safe Sherae Santino PT, DPT, GCS   Caitrin Pendergraph, PT 05/08/2022, 1:14 PM

## 2022-05-08 ENCOUNTER — Ambulatory Visit: Payer: Medicare Other

## 2022-05-08 ENCOUNTER — Encounter: Payer: Self-pay | Admitting: Urology

## 2022-05-08 ENCOUNTER — Ambulatory Visit (INDEPENDENT_AMBULATORY_CARE_PROVIDER_SITE_OTHER): Payer: Medicare Other | Admitting: Urology

## 2022-05-08 VITALS — BP 157/76 | HR 55 | Ht 70.0 in | Wt 208.0 lb

## 2022-05-08 DIAGNOSIS — R3121 Asymptomatic microscopic hematuria: Secondary | ICD-10-CM | POA: Diagnosis not present

## 2022-05-08 DIAGNOSIS — M25551 Pain in right hip: Secondary | ICD-10-CM | POA: Diagnosis not present

## 2022-05-08 DIAGNOSIS — R3129 Other microscopic hematuria: Secondary | ICD-10-CM

## 2022-05-08 NOTE — Progress Notes (Signed)
   05/08/22 1:26 PM   Lelon Huh 07/22/1945 [id number]  CC: Asymptomatic microscopic hematuria  HPI: 77 year old male referred for the above issues.  He had 2 urine samples and January that showed 18 and 21 RBC respectively, with no evidence of infection.  PSA was normal at 0.65, renal function essentially normal with creatinine 1.3, EGFR 57.  No recent cross-sectional imaging to review.  He denies any urinary symptoms or gross hematuria.  Minimal and distant smoking history over 60 years ago.  PMH: Past Medical History:  Diagnosis Date   Allergy to alpha-gal    GERD (gastroesophageal reflux disease)    Hypertension    Pinched nerve    some numbness R arm, R ankle   Sleep apnea    CPAP    Surgical History: Past Surgical History:  Procedure Laterality Date   APPENDECTOMY     CATARACT EXTRACTION W/PHACO Left 07/03/2021   Procedure: CATARACT EXTRACTION PHACO AND INTRAOCULAR LENS PLACEMENT (IOC) LEFT VIVITY TORIC 3.59 00:27.8;  Surgeon: Eulogio Bear, MD;  Location: [place];  Service: Ophthalmology;  Laterality: Left;  sleep apnea   CATARACT EXTRACTION W/PHACO Right 07/17/2021   Procedure: CATARACT EXTRACTION PHACO AND INTRAOCULAR LENS PLACEMENT (IOC) RIGHT VIVITY TORIC 4.20 00:33.2;  Surgeon: Eulogio Bear, MD;  Location: [place];  Service: Ophthalmology;  Laterality: Right;  sleep apnea   NASAL SEPTUM SURGERY      Social History:  reports that he has quit smoking. He has never used smokeless tobacco. He reports that he does not currently use alcohol. No history on file for drug use.  Physical Exam: BP (!) 157/76 (BP Location: Left Arm, Patient Position: Sitting, Cuff Size: Normal)   Pulse (!) 55   Ht '5\' 10"'$  (1.778 m)   Wt 208 lb (94.3 kg)   BMI 29.84 kg/m    Constitutional:  Alert and oriented, No acute distress. Cardiovascular: No clubbing, cyanosis, or edema. Respiratory: Normal respiratory effort, no increased work of breathing. GI:  Abdomen is soft, nontender, nondistended, no abdominal masses  Assessment & Plan:   77 year old male with asymptomatic microscopic hematuria with 18 and 21 RBC seen on recent urinalysis x 2 with no evidence of infection.  We discussed common possible etiologies of microscopic hematuria including idiopathic, urolithiasis, medical renal disease, and malignancy. We discussed the new asymptomatic microscopic hematuria guidelines and risk categories of low, intermediate, and high risk that are based on age, risk factors like smoking, and degree of microscopic hematuria. We discussed work-up can range from repeat urinalysis, renal ultrasound and cystoscopy, to CT urogram and cystoscopy.  They fall into the high risk category, and I recommended proceeding with CT and cystoscopy.    Nickolas Madrid, MD 05/08/2022  [facility] [address], [place], [place] [address] [phone]

## 2022-05-08 NOTE — Patient Instructions (Signed)

## 2022-05-09 NOTE — Therapy (Incomplete)
OUTPATIENT PHYSICAL THERAPY THORACOLUMBAR/HIP TREATMENT  Patient Name: Chad Gaines MRN: [id number] DOB:Jan 12, 1946, 77 y.o., male Today's Date: 05/09/2022  END OF SESSION:   Past Medical History:  Diagnosis Date   Allergy to alpha-gal    GERD (gastroesophageal reflux disease)    Hypertension    Pinched nerve    some numbness R arm, R ankle   Sleep apnea    CPAP   Past Surgical History:  Procedure Laterality Date   APPENDECTOMY     CATARACT EXTRACTION W/PHACO Left 07/03/2021   Procedure: CATARACT EXTRACTION PHACO AND INTRAOCULAR LENS PLACEMENT (IOC) LEFT VIVITY TORIC 3.59 00:27.8;  Surgeon: Eulogio Bear, MD;  Location: [facility];  Service: Ophthalmology;  Laterality: Left;  sleep apnea   CATARACT EXTRACTION W/PHACO Right 07/17/2021   Procedure: CATARACT EXTRACTION PHACO AND INTRAOCULAR LENS PLACEMENT (IOC) RIGHT VIVITY TORIC 4.20 00:33.2;  Surgeon: Eulogio Bear, MD;  Location: [facility];  Service: Ophthalmology;  Laterality: Right;  sleep apnea   NASAL SEPTUM SURGERY     Patient Active Problem List   Diagnosis Date Noted   Adult hypothyroidism 12/25/2014   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   Apnea, sleep 12/25/2014   Essential (primary) hypertension 08/05/2014   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[place]) 03/08/2014   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   PCP: Emily Filbert MD  REFERRING PROVIDER: Emily Filbert MD  REFERRING DIAG: M25.559 (ICD-10-CM) - Hip pain   RATIONALE FOR EVALUATION AND TREATMENT: Rehabilitation  THERAPY DIAG: Pain in right hip  ONSET DATE: Multiple years  FOLLOW-UP APPT SCHEDULED WITH REFERRING PROVIDER: Yes   FROM INITIAL EVALUATION SUBJECTIVE:                                                                                                                                                                                         SUBJECTIVE STATEMENT:  R hip pain  PERTINENT HISTORY:  Pt reports R hip pain for multiple  years with progressive worsening. Pain was initially in the anterior groin and posterior hip however the anterior hip pain has mostly resolved at this time with medication. He is now limping and walking with a hiking pole. Pain worsens throughout the day. Pt reports progressive RLE weakness however he has not had any falls. Posterior hip pain has been nonresponsive to anti-inflammatories, oral steroids, gabapentin, and cortisone injections. Plain x-ray showed L5-S1 disc narrowing. Lumbar MRI ordered. "It has started to define my life." He used to like to walk and hike but hasn't been able to lately because of the pain. History of cervical pain and RUE radiculopathy.    PAIN:    Pain Intensity: Present: 0/10, Best:  0/10, Worst: 8/10 Pain location: Anterior and posterior R hip (mostly posterior) Pain Quality: aching posteriorly but becomes sharp with walking. Radiating: Yes, posterolateral RLE down to the ankle Numbness/Tingling: Yes, "prickly" down RLE; Focal Weakness: Yes Aggravating factors: walking, extended sitting, Relieving factors: static standing, changing positions, hasn't tried ice/heat 24-hour pain behavior: gradually worsens as the day progresses History of prior back injury, pain, surgery, or therapy: No Dominant hand: right Imaging: Yes, see history Red flags: Positive: History of cancer (CLL), Negative for bowel/bladder changes, saddle paresthesia, personal history of cancer, h/o spinal tumors, h/o compression fx, h/o abdominal aneurysm, abdominal pain, chills/fever, night sweats, nausea, vomiting, unrelenting pain, first onset of insidious LBP <20 y/o  PRECAUTIONS: None  WEIGHT BEARING RESTRICTIONS: No  FALLS: Has patient fallen in last 6 months? 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   Hobbies: Reading  Patient Goals: "To walk normally." Pt would like to decrease his pain and be able to walk and hike    OBJECTIVE:  Patient Surveys  FOTO 38, predicted improvement to 59  Cognition Patient is oriented to person, place, and time.  Recent memory is intact.  Remote memory is intact.  Attention span and concentration are intact.  Expressive speech is intact.  Patient's fund of knowledge is within normal limits for educational level.    Gross Musculoskeletal Assessment Tremor: None Bulk: Normal Tone: Normal No visible step-off along spinal column, no signs of scoliosis  GAIT: Antalgic gait on the RLE. Pt ambulates with single hiking pole  Posture: Lumbar lordosis: WNL Iliac crest height: Equal bilaterally Lumbar lateral shift: Negative  AROM AROM (Normal range in degrees) AROM   Lumbar   Flexion (65) Moderate segmental loss, no pain  Extension (30) Moderate to severe loss of extension, anterior R hip pain  Right lateral flexion (25) Moderate loss, anterior R hip pain  Left lateral flexion (25) Moderate loss, no pain  Right rotation (30) Min loss, no pain  Left rotation (30) Min loss, no pain      Hip Right Left  Flexion (125) Limited by pain around 90 degrees  WNL  Extension (15)    Abduction (40)    Adduction     Internal Rotation (45) 10* 28  External Rotation (45) 55 50      Knee    Flexion (135) WNL WNL  Extension (0) WNL WNL      Ankle    Dorsiflexion (20)    Plantarflexion (50)    Inversion (35)    Eversion (15)    (* = pain; Blank rows = not tested)  LE MMT: MMT (out of 5) Right  Left   Hip flexion 4* 5  Hip extension    Hip abduction (seated) 5* 5  Hip adduction (seated) 5* 5  Hip internal rotation 5* 5  Hip external rotation 5* 5  Knee flexion (seated) 4+* 5  Knee extension 5 5  Ankle dorsiflexion 5 5   Ankle plantarflexion Strong Strong  Ankle inversion    Ankle eversion    (* = pain; Blank rows = not tested)  Sensation Decreased sensation to light touch RLE L5-S1. Otherwise WNL BLE. Proprioception, stereognosis, and hot/cold testing deferred on this  date.  Reflexes R/L Knee Jerk (L3/4): 1+/1+  Ankle Jerk (S1/2): 1+/1+   Muscle Length Hamstrings: R: Positive for tightness at 52 degrees L: Positive for tightness at 50 degrees [place] (quadriceps): R: Not examined L: Not examined Thomas (hip flexors): R: Not examined L: Not examined Ober: R: Not examined L: Not examined  Palpation Location Right Left         Lumbar paraspinals    Quadratus Lumborum    Gluteus Maximus 1   Gluteus Medius 1   Deep hip external rotators 1   PSIS 1   Fortin's Area (SIJ) 1   Greater Trochanter 1   (Blank rows = not tested) Graded on 0-4 scale (0 = no pain, 1 = pain, 2 = pain with wincing/grimacing/flinching, 3 = pain with withdrawal, 4 = unwilling to allow palpation)  Passive Accessory Intervertebral Motion Deferred  Special Tests Lumbar Radiculopathy and Discogenic: Centralization and Peripheralization (SN 92, -LR 0.12): Negative, worsening hip pain but symptoms do not centralize or peripheralize Slump (SN 83, -LR 0.32): R: Negative L: Negative SLR (SN 92, -LR 0.29): R: Negative L:  Negative Crossed SLR (SP 90): R: Negative L: Negative  Facet Joint: Extension-Rotation (SN 100, -LR 0.0): R: Negative L: Negative  Lumbar Foraminal Stenosis: Lumbar quadrant (SN 70): R: Negative L: Negative  Hip: FABER (SN 81): R: Positive L: Negative FADIR (SN 94): R: Positive L: Negative Hip scour (SN 50): R: Negative L: Negative  SIJ:  Thigh Thrust (SN 88, -LR 0.18) : R: Not examined L: Not examined  Piriformis Syndrome: FAIR Test (SN 88, SP 83): R: Not examined L: Not examined  Functional Tasks Deferred  Beighton scale:  Deferred   TODAY'S TREATMENT:   SUBJECTIVE: Patient arrives reporting  continued R hip pain. It has progressively worsened to the point where it is causing him notable nighttime pain. He has an appointment to see Dr. Landis Gandy at [facility] on 05/23/22.    PAIN: R hip pain reported upon arrival, not asked to rate today;   Ther-ex  NuStep L1-3 x 10 minutes for LE strengthening and R hip AAROM during interval history and while discussing plan of care; Complete FOTO: Hooklying marches with blue tband 2 x 10; Hooklying clams with blue tband 2 x 10; Hooklying adductor squeeze with manual resistance from therapist 2 x 10; Hooklying bridges x 10; Total Gym Level 22 squats 2 x 10, no pain; Seated LAQ with 4# ankle weights (AW) x 15 BLE; Seated hamstring curls with blue tband x 15 RLE; Standing marches with 4# AW x 15 BLE; Standing hip abduction with 4# AW x 15 BLE; Standing heel raises x 10 BLE;   Manual Therapy  Supine R hip AP mobilizations, grade I-II, 2 x 30s; Supine manual right hip long axis distraction mobilizations 2 x 30s; Supine right hip inferior mobilizations with hip flexed to 45 degrees, grade III, 2 x 30s;   Not today Prone lumbar mobilizations L2-L5, grade I, 20s/bout x 1 bout/level; Prone STM with hypervolt to posterior R hip musculature; Prone STM to R lumbar paraspinals with effleurage and gentle trigger point release;   PATIENT EDUCATION:  Education details: Plan of care Person educated: Patient Education method: Explanation Education comprehension: verbalized understanding and returned demonstration   HOME EXERCISE PROGRAM:  Access Code: [email or web address] URL: [email or web address] Date: 05/03/2022 Prepared by: Roxana Hires  Exercises - Supine Lower Trunk Rotation  - 1 x daily - 7 x weekly - 2 reps - 30s hold - Sidelying Hip Abduction  -  1 x daily - 7 x weekly - 2 sets - 10 reps - 3s hold - Supine Bridge  - 1 x daily - 7 x weekly - 2 sets - 10 reps - 3s hold - Seated Hip Abduction with Resistance  - 1 x daily - 7 x weekly - 2  sets - 10 reps - 3s hold - Seated Hip Adduction Isometrics with Ball  - 1 x daily - 7 x weekly - 2 sets - 10 reps - 3s hold - Mini Squat with Counter Support  - 1 x daily - 7 x weekly - 2 sets - 10 reps - Seated Hip Flexor Stretch  - 1 x daily - 7 x weekly - 3 reps - 30-45s hold - Seated Hamstring Stretch  - 1 x daily - 7 x weekly - 3 reps - 30-45s hold   ASSESSMENT:  CLINICAL IMPRESSION: Continued manual techniques for right hip pain during session today. Slow progression of strengthening today which will be the primary focus of future sessions. Pt is able to perform standing exercises today but does have notable increase in pain with marches and hip abduction. Pt encouraged to discontinue sidelying hip abduction as this is notably painful for him and instead encouraged him to attempt standing hip abduction with band around knees. His hip pain is gradually worsening and he has an appt with Dr. Landis [facility] at [facility] in a couple weeks. Plan is to progress strengthening as much as possible while he waits for his surgical consult. Pt is going to bring a picture of his townhouse complex gym to an upcoming appointment so we can discuss gym-based strengthening. He will benefit from PT services while awaiting to consult orthopedics in order to address deficits in strength and pain so he can return to full function at home and with leisure activities.   OBJECTIVE IMPAIRMENTS: Abnormal gait, decreased ROM, decreased strength, and pain.   ACTIVITY LIMITATIONS: bending, sitting, and squatting  PARTICIPATION LIMITATIONS: community activity  PERSONAL FACTORS: 3+ comorbidities: CLL, OSA, OA, and neuropathy  are also affecting patient's functional outcome.   REHAB POTENTIAL: Good  CLINICAL DECISION MAKING: Evolving/moderate complexity  EVALUATION COMPLEXITY: Low   GOALS: Goals reviewed with patient? No  SHORT TERM GOALS: Target date: 04/30/2022  Pt will be independent with HEP in order to improve  strength and decrease hip pain to improve pain-free function at home. Baseline:  Goal status: ONGOING   LONG TERM GOALS: Target date: 05/24/2022   Pt will increase FOTO to at least 61 to demonstrate significant improvement in function at home and work related to back pain  Baseline: 03/29/22: 38 Goal status: DEFERRED  2.  Pt will decrease worst hip pain by at least 3 points on the NPRS in order to demonstrate clinically significant reduction in hip pain. Baseline: 03/29/22: worst: 8/10; 05/08/22: worst: 8/10; Goal status: ONGOING  3.  Pt will decrease mODI score by at least 13 points in order demonstrate clinically significant reduction in back pain/disability.       Baseline: 03/29/22: To be completed Goal status: DISCONTINUED  4.  Pt will report at least 75% improvement in his hip symptoms in order to return to walking and hiking without notable increase in his pain.  Baseline: 05/08/22: Gradualy worsening Goal status: ONGOING   PLAN: PT FREQUENCY: 1-2x/week  PT DURATION: 8 weeks  PLANNED INTERVENTIONS: Therapeutic exercises, Therapeutic activity, Neuromuscular re-education, Balance training, Gait training, Patient/Family education, Self Care, Joint mobilization, Joint manipulation, Vestibular training, Canalith repositioning, Orthotic/Fit  training, DME instructions, Dry Needling, Electrical stimulation, Spinal manipulation, Spinal mobilization, Cryotherapy, Moist heat, Taping, Traction, Ultrasound, Ionotophoresis '4mg'$ /ml Dexamethasone, Manual therapy, and Re-evaluation.  PLAN FOR NEXT SESSION: continue manual therapy and strengthening for hip, review/modify HEP as needed   Lyndel Safe Rashiya Lofland PT, DPT, GCS   Larayah Clute, PT 05/09/2022, 12:55 PM

## 2022-05-10 ENCOUNTER — Ambulatory Visit: Payer: Medicare Other

## 2022-05-10 DIAGNOSIS — M25551 Pain in right hip: Secondary | ICD-10-CM | POA: Diagnosis not present

## 2022-05-10 NOTE — Therapy (Signed)
OUTPATIENT PHYSICAL THERAPY TREATMENT Patient Name: Chad Gaines MRN: [id number] DOB:1945-09-05, 77 y.o., male Today's Date: 05/10/2022  END OF SESSION:  PT End of Session - 05/10/22 0852     Visit Number 11    Number of Visits 17    Date for PT Re-Evaluation 05/27/22    Authorization Type Medicare A&B    Authorization Time Period 03/30/23-05/24/22    Progress Note Due on Visit 20    PT Start Time 0845    PT Stop Time 0925    PT Time Calculation (min) 40 min    Equipment Utilized During Treatment Gait belt    Activity Tolerance Patient tolerated treatment well;No increased pain    Behavior During Therapy WFL for tasks assessed/performed            Past Medical History:  Diagnosis Date   Allergy to alpha-gal    GERD (gastroesophageal reflux disease)    Hypertension    Pinched nerve    some numbness R arm, R ankle   Sleep apnea    CPAP   Past Surgical History:  Procedure Laterality Date   APPENDECTOMY     CATARACT EXTRACTION W/PHACO Left 07/03/2021   Procedure: CATARACT EXTRACTION PHACO AND INTRAOCULAR LENS PLACEMENT (IOC) LEFT VIVITY TORIC 3.59 00:27.8;  Surgeon: Eulogio Bear, MD;  Location: [facility];  Service: Ophthalmology;  Laterality: Left;  sleep apnea   CATARACT EXTRACTION W/PHACO Right 07/17/2021   Procedure: CATARACT EXTRACTION PHACO AND INTRAOCULAR LENS PLACEMENT (IOC) RIGHT VIVITY TORIC 4.20 00:33.2;  Surgeon: Eulogio Bear, MD;  Location: [facility];  Service: Ophthalmology;  Laterality: Right;  sleep apnea   NASAL SEPTUM SURGERY     Patient Active Problem List   Diagnosis Date Noted   Adult hypothyroidism 12/25/2014   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   Apnea, sleep 12/25/2014   Essential (primary) hypertension 08/05/2014   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   Chronic lymphocytic leukemia ([place]) 03/08/2014   PCP: Emily Filbert MD  REFERRING PROVIDER: Emily Filbert MD  REFERRING DIAG: M25.559 (ICD-10-CM) - Hip pain    RATIONALE FOR EVALUATION AND TREATMENT: Rehabilitation  THERAPY DIAG: Pain in right hip  ONSET DATE: Multiple years  FOLLOW-UP APPT SCHEDULED WITH REFERRING PROVIDER: Yes   FROM INITIAL EVALUATION SUBJECTIVE:                                                                                                                                                                                         SUBJECTIVE STATEMENT:  R hip pain  PERTINENT HISTORY:  Pt reports R hip pain for multiple years with progressive worsening. Pain was initially in  the anterior groin and posterior hip however the anterior hip pain has mostly resolved at this time with medication. He is now limping and walking with a hiking pole. Pain worsens throughout the day. Pt reports progressive RLE weakness however he has not had any falls. Posterior hip pain has been nonresponsive to anti-inflammatories, oral steroids, gabapentin, and cortisone injections. Plain x-ray showed L5-S1 disc narrowing. Lumbar MRI ordered. "It has started to define my life." He used to like to walk and hike but hasn't been able to lately because of the pain. History of cervical pain and RUE radiculopathy.    PAIN:    Pain Intensity: 3/10 Rt hip   PRECAUTIONS: None  WEIGHT BEARING RESTRICTIONS: No  FALLS: Has patient fallen in last 6 months? No   TODAY'S TREATMENT:   SUBJECTIVE: Doing well, hip sore today as is typical. HEP updates are going fine.   PAIN: R hip pain reported upon arrival, not asked to rate today; 3/10   Intervention 05/10/22  AA/ROM using NuStep x10 minutes for LE strengthening and R hip AAROM  during interval history and while discussing plan of care; Seat 13, arms 13, Level 1, 2, 3  RLE LAD 3x2 minutes   Total Gym Level 22 squats 2 x 10, no pain;  Hooklying marches with blue tband 2 x 10 bilat; Hooklying clams with blue tband 2 x 10; Hooklying adductor squeeze with ball resistance 2 x 10; Hooklying bridges  2x10;  Hooklying SAQ 4# ankle weights (AW) x 15 RLE; Seated hamstring curls 4#AW x 15 RLE; Standing hip abduction with 4# AW x 15 RLE; Standing heel raises x 20 BLE;    PATIENT EDUCATION:  Education details: Plan of care Person educated: Patient Education method: Explanation Education comprehension: verbalized understanding and returned demonstration   HOME EXERCISE PROGRAM:  Access Code: [email or web address] URL: [email or web address] Date: 05/03/2022 Prepared by: Roxana Hires  Exercises - Supine Lower Trunk Rotation  - 1 x daily - 7 x weekly - 2 reps - 30s hold - Sidelying Hip Abduction  - 1 x daily - 7 x weekly - 2 sets - 10 reps - 3s hold - Supine Bridge  - 1 x daily - 7 x weekly - 2 sets - 10 reps - 3s hold - Seated Hip Abduction with Resistance  - 1 x daily - 7 x weekly - 2 sets - 10 reps - 3s hold - Seated Hip Adduction Isometrics with Ball  - 1 x daily - 7 x weekly - 2 sets - 10 reps - 3s hold - Mini Squat with Counter Support  - 1 x daily - 7 x weekly - 2 sets - 10 reps - Seated Hip Flexor Stretch  - 1 x daily - 7 x weekly - 3 reps - 30-45s hold - Seated Hamstring Stretch  - 1 x daily - 7 x weekly - 3 reps - 30-45s hold   ASSESSMENT:  CLINICAL IMPRESSION: Continued manual techniques for right hip pain during session today. Slow progression of strengthening today which will be the primary focus of future sessions. He will benefit from PT services while awaiting to consult orthopedics in order to address deficits in strength and pain so he can return to full function at home and with leisure activities.   OBJECTIVE IMPAIRMENTS: Abnormal gait, decreased ROM, decreased strength, and pain.   ACTIVITY LIMITATIONS: bending, sitting, and squatting  PARTICIPATION LIMITATIONS: community activity  PERSONAL FACTORS: 3+ comorbidities: CLL, OSA, OA, and neuropathy  are also affecting patient's  functional outcome.   REHAB POTENTIAL: Good  CLINICAL DECISION MAKING:  Evolving/moderate complexity  EVALUATION COMPLEXITY: Low   GOALS: Goals reviewed with patient? No  SHORT TERM GOALS: Target date: 04/30/2022  Pt will be independent with HEP in order to improve strength and decrease hip pain to improve pain-free function at home. Baseline:  Goal status: ONGOING   LONG TERM GOALS: Target date: 05/24/2022   Pt will increase FOTO to at least 61 to demonstrate significant improvement in function at home and work related to back pain  Baseline: 03/29/22: 38 Goal status: DEFERRED  2.  Pt will decrease worst hip pain by at least 3 points on the NPRS in order to demonstrate clinically significant reduction in hip pain. Baseline: 03/29/22: worst: 8/10; 05/08/22: worst: 8/10; Goal status: ONGOING  3.  Pt will decrease mODI score by at least 13 points in order demonstrate clinically significant reduction in back pain/disability.       Baseline: 03/29/22: To be completed Goal status: DISCONTINUED  4.  Pt will report at least 75% improvement in his hip symptoms in order to return to walking and hiking without notable increase in his pain.  Baseline: 05/08/22: Gradualy worsening Goal status: ONGOING   PLAN: PT FREQUENCY: 1-2x/week  PT DURATION: 8 weeks  PLANNED INTERVENTIONS: Therapeutic exercises, Therapeutic activity, Neuromuscular re-education, Balance training, Gait training, Patient/Family education, Self Care, Joint mobilization, Joint manipulation, Vestibular training, Canalith repositioning, Orthotic/Fit training, DME instructions, Dry Needling, Electrical stimulation, Spinal manipulation, Spinal mobilization, Cryotherapy, Moist heat, Taping, Traction, Ultrasound, Ionotophoresis '4mg'$ /ml Dexamethasone, Manual therapy, and Re-evaluation.  PLAN FOR NEXT SESSION: continue manual therapy and strengthening for hip, review/modify HEP as needed  8:58 AM, 05/10/22 Etta Grandchild, PT, DPT Physical Therapist - [facility] in [place] (Office)      [place] C, PT 05/10/2022, 8:58 AM

## 2022-05-14 NOTE — Therapy (Signed)
OUTPATIENT PHYSICAL THERAPY TREATMENT Patient Name: Chad Gaines MRN: [id number] DOB:Feb 17, 1946, 77 y.o., male Today's Date: 05/15/2022  END OF SESSION:   PT End of Session - 05/15/22 0817     Visit Number 12    Number of Visits 17    Date for PT Re-Evaluation 05/27/22    Authorization Type Medicare A&B    Authorization Time Period 03/30/23-05/24/22    PT Start Time 0803    PT Stop Time 0855    PT Time Calculation (min) 52 min    Equipment Utilized During Treatment Gait belt    Activity Tolerance Patient tolerated treatment well;No increased pain    Behavior During Therapy WFL for tasks assessed/performed            Past Medical History:  Diagnosis Date   Allergy to alpha-gal    GERD (gastroesophageal reflux disease)    Hypertension    Pinched nerve    some numbness R arm, R ankle   Sleep apnea    CPAP   Past Surgical History:  Procedure Laterality Date   APPENDECTOMY     CATARACT EXTRACTION W/PHACO Left 07/03/2021   Procedure: CATARACT EXTRACTION PHACO AND INTRAOCULAR LENS PLACEMENT (IOC) LEFT VIVITY TORIC 3.59 00:27.8;  Surgeon: Eulogio Bear, MD;  Location: [place];  Service: Ophthalmology;  Laterality: Left;  sleep apnea   CATARACT EXTRACTION W/PHACO Right 07/17/2021   Procedure: CATARACT EXTRACTION PHACO AND INTRAOCULAR LENS PLACEMENT (IOC) RIGHT VIVITY TORIC 4.20 00:33.2;  Surgeon: Eulogio Bear, MD;  Location: [place];  Service: Ophthalmology;  Laterality: Right;  sleep apnea   NASAL SEPTUM SURGERY     Patient Active Problem List   Diagnosis Date Noted   Adult hypothyroidism 12/25/2014   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   Apnea, sleep 12/25/2014   Essential (primary) hypertension 08/05/2014   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[place]) 03/08/2014   Chronic lymphocytic leukemia ([place]) 03/08/2014   PCP: Emily Filbert MD  REFERRING PROVIDER: Emily Filbert MD  REFERRING DIAG: M25.559 (ICD-10-CM) - Hip pain   RATIONALE FOR EVALUATION  AND TREATMENT: Rehabilitation  THERAPY DIAG: Pain in right hip  ONSET DATE: Multiple years  FOLLOW-UP APPT SCHEDULED WITH REFERRING PROVIDER: Yes   FROM INITIAL EVALUATION SUBJECTIVE:                                                                                                                                                                                         SUBJECTIVE STATEMENT:  R hip pain  PERTINENT HISTORY:  Pt reports R hip pain for multiple years with progressive worsening. Pain was initially in the anterior groin and posterior hip however  the anterior hip pain has mostly resolved at this time with medication. He is now limping and walking with a hiking pole. Pain worsens throughout the day. Pt reports progressive RLE weakness however he has not had any falls. Posterior hip pain has been nonresponsive to anti-inflammatories, oral steroids, gabapentin, and cortisone injections. Plain x-ray showed L5-S1 disc narrowing. Lumbar MRI ordered. "It has started to define my life." He used to like to walk and hike but hasn't been able to lately because of the pain. History of cervical pain and RUE radiculopathy.    PAIN:    Pain Intensity: 3/10 Rt hip   PRECAUTIONS: None  WEIGHT BEARING RESTRICTIONS: No  FALLS: Has patient fallen in last 6 months? No   TODAY'S TREATMENT:   SUBJECTIVE: Doing well, hip sore today as is typical. HEP updates are going fine.   PAIN: R hip pain reported upon arrival, not asked to rate today; 3/10   Ther-ex  AA/ROM using NuStep L1-5 x 10 minutes for LE strengthening and R hip AAROM  during interval history and while discussing plan of care; Hooklying marches with x 15 BLE; Hooklying clams with manual resistance 2 x 15; Hooklying adductor squeeze with ball resistance 2 x 15; Hooklying bridges 2 x 15;  Seated LAQ 3# ankle weights (AW) x 15 RLE; Standing hamstring curls 3# AW x 15 BLE; Standing hip flexion marches 3# AW x 15 BLE; Standing hip  abduction with 3# AW x 15 BLE; Standing mini squats x 10;   Manual Therapy  Supine R hip AP mobilizations, grade I-II, 2 x 30s; Supine manual right hip long axis distraction mobilizations 2 x 30s; Supine right hip medial to lateral mobilizations with hip flexed to 45 degrees, grade I-II, 2 x 30s;   PATIENT EDUCATION:  Education details: Plan of care Person educated: Patient Education method: Explanation Education comprehension: verbalized understanding and returned demonstration   HOME EXERCISE PROGRAM:  Access Code: [email or web address] URL: [email or web address] Date: 05/03/2022 Prepared by: Roxana Hires  Exercises - Supine Lower Trunk Rotation  - 1 x daily - 7 x weekly - 2 reps - 30s hold - Sidelying Hip Abduction  - 1 x daily - 7 x weekly - 2 sets - 10 reps - 3s hold - Supine Bridge  - 1 x daily - 7 x weekly - 2 sets - 10 reps - 3s hold - Seated Hip Abduction with Resistance  - 1 x daily - 7 x weekly - 2 sets - 10 reps - 3s hold - Seated Hip Adduction Isometrics with Ball  - 1 x daily - 7 x weekly - 2 sets - 10 reps - 3s hold - Mini Squat with Counter Support  - 1 x daily - 7 x weekly - 2 sets - 10 reps - Seated Hip Flexor Stretch  - 1 x daily - 7 x weekly - 3 reps - 30-45s hold - Seated Hamstring Stretch  - 1 x daily - 7 x weekly - 3 reps - 30-45s hold   ASSESSMENT:  CLINICAL IMPRESSION: Continued manual techniques for right hip pain during session today but limited manual therapy in order to focus on strengthening. Slow progression of strengthening today which will be the primary focus of future sessions. Plan will be to update his HEP at next session and consider possible discharge for him to continue independently until his consult with his orthopedic surgeon. He will benefit from PT services while awaiting to consult orthopedics in order to address deficits in strength  and pain so he can return to full function at home and with leisure activities.   OBJECTIVE  IMPAIRMENTS: Abnormal gait, decreased ROM, decreased strength, and pain.   ACTIVITY LIMITATIONS: bending, sitting, and squatting  PARTICIPATION LIMITATIONS: community activity  PERSONAL FACTORS: 3+ comorbidities: CLL, OSA, OA, and neuropathy  are also affecting patient's functional outcome.   REHAB POTENTIAL: Good  CLINICAL DECISION MAKING: Evolving/moderate complexity  EVALUATION COMPLEXITY: Low   GOALS: Goals reviewed with patient? No  SHORT TERM GOALS: Target date: 04/30/2022  Pt will be independent with HEP in order to improve strength and decrease hip pain to improve pain-free function at home. Baseline:  Goal status: ONGOING   LONG TERM GOALS: Target date: 05/24/2022   Pt will increase FOTO to at least 61 to demonstrate significant improvement in function at home and work related to back pain  Baseline: 03/29/22: 38 Goal status: DEFERRED  2.  Pt will decrease worst hip pain by at least 3 points on the NPRS in order to demonstrate clinically significant reduction in hip pain. Baseline: 03/29/22: worst: 8/10; 05/08/22: worst: 8/10; Goal status: ONGOING  3.  Pt will decrease mODI score by at least 13 points in order demonstrate clinically significant reduction in back pain/disability.       Baseline: 03/29/22: To be completed Goal status: DISCONTINUED  4.  Pt will report at least 75% improvement in his hip symptoms in order to return to walking and hiking without notable increase in his pain.  Baseline: 05/08/22: Gradualy worsening Goal status: ONGOING   PLAN: PT FREQUENCY: 1-2x/week  PT DURATION: 8 weeks  PLANNED INTERVENTIONS: Therapeutic exercises, Therapeutic activity, Neuromuscular re-education, Balance training, Gait training, Patient/Family education, Self Care, Joint mobilization, Joint manipulation, Vestibular training, Canalith repositioning, Orthotic/Fit training, DME instructions, Dry Needling, Electrical stimulation, Spinal manipulation, Spinal  mobilization, Cryotherapy, Moist heat, Taping, Traction, Ultrasound, Ionotophoresis '4mg'$ /ml Dexamethasone, Manual therapy, and Re-evaluation.  PLAN FOR NEXT SESSION: continue manual therapy and strengthening for hip, review/modify HEP as needed    Lyndel Safe Chesley Valls PT, DPT, GCS  Kailana Benninger, PT 05/15/2022, 9:38 AM

## 2022-05-15 ENCOUNTER — Ambulatory Visit: Payer: Medicare Other

## 2022-05-15 DIAGNOSIS — M25551 Pain in right hip: Secondary | ICD-10-CM | POA: Diagnosis not present

## 2022-05-17 ENCOUNTER — Ambulatory Visit: Payer: Medicare Other | Attending: Internal Medicine

## 2022-05-17 DIAGNOSIS — M25551 Pain in right hip: Secondary | ICD-10-CM

## 2022-05-17 NOTE — Therapy (Signed)
OUTPATIENT PHYSICAL THERAPY TREATMENT  Patient Name: Chad Gaines MRN: [id number] DOB:1945-06-19, 77 y.o., male Today's Date: 05/19/2022  END OF SESSION:  PT End of Session - 05/19/22 1427     Visit Number 13    Number of Visits 17    Date for PT Re-Evaluation 05/27/22    Authorization Type Medicare A&B    Authorization Time Period 03/30/23-05/24/22    PT Start Time 0847    PT Stop Time 0930    PT Time Calculation (min) 43 min    Equipment Utilized During Treatment Gait belt    Activity Tolerance Patient tolerated treatment well;No increased pain    Behavior During Therapy WFL for tasks assessed/performed            Past Medical History:  Diagnosis Date   Allergy to alpha-gal    GERD (gastroesophageal reflux disease)    Hypertension    Pinched nerve    some numbness R arm, R ankle   Sleep apnea    CPAP   Past Surgical History:  Procedure Laterality Date   APPENDECTOMY     CATARACT EXTRACTION W/PHACO Left 07/03/2021   Procedure: CATARACT EXTRACTION PHACO AND INTRAOCULAR LENS PLACEMENT (IOC) LEFT VIVITY TORIC 3.59 00:27.8;  Surgeon: Eulogio Bear, MD;  Location: [place];  Service: Ophthalmology;  Laterality: Left;  sleep apnea   CATARACT EXTRACTION W/PHACO Right 07/17/2021   Procedure: CATARACT EXTRACTION PHACO AND INTRAOCULAR LENS PLACEMENT (IOC) RIGHT VIVITY TORIC 4.20 00:33.2;  Surgeon: Eulogio Bear, MD;  Location: [place];  Service: Ophthalmology;  Laterality: Right;  sleep apnea   NASAL SEPTUM SURGERY     Patient Active Problem List   Diagnosis Date Noted   Adult hypothyroidism 12/25/2014   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   Apnea, sleep 12/25/2014   Essential (primary) hypertension 08/05/2014   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[place]) 03/08/2014   Chronic lymphocytic leukemia ([place]) 03/08/2014   PCP: Emily Filbert MD  REFERRING PROVIDER: Emily Filbert MD  REFERRING DIAG: M25.559 (ICD-10-CM) - Hip pain   RATIONALE FOR EVALUATION  AND TREATMENT: Rehabilitation  THERAPY DIAG: Pain in right hip  ONSET DATE: Multiple years  FOLLOW-UP APPT SCHEDULED WITH REFERRING PROVIDER: Yes   FROM INITIAL EVALUATION SUBJECTIVE:                                                                                                                                                                                         SUBJECTIVE STATEMENT:  R hip pain  PERTINENT HISTORY:  Pt reports R hip pain for multiple years with progressive worsening. Pain was initially in the anterior groin and posterior hip however  the anterior hip pain has mostly resolved at this time with medication. He is now limping and walking with a hiking pole. Pain worsens throughout the day. Pt reports progressive RLE weakness however he has not had any falls. Posterior hip pain has been nonresponsive to anti-inflammatories, oral steroids, gabapentin, and cortisone injections. Plain x-ray showed L5-S1 disc narrowing. Lumbar MRI ordered. "It has started to define my life." He used to like to walk and hike but hasn't been able to lately because of the pain. History of cervical pain and RUE radiculopathy.    PAIN:    Pain Intensity: 3/10 Rt hip   PRECAUTIONS: None  WEIGHT BEARING RESTRICTIONS: No  FALLS: Has patient fallen in last 6 months? No   TODAY'S TREATMENT:   SUBJECTIVE: Pt reports that he is doing well today. His R hip pain continues to fluctuate regularly from day to day. No significant changes since the last therapy session. HEP is going well.    PAIN: R hip pain reported upon arrival, not asked to rate today;   Ther-ex  AA/ROM using NuStep L1-4 x 10 minutes for LE strengthening and R hip AAROM  during interval history and while discussing plan of care; Hooklying marches with x 15 BLE; Hooklying clams with manual resistance 2 x 15; Hooklying adductor squeeze with ball resistance 2 x 15; Hooklying bridges 2 x 15;   Manual Therapy  Supine R hip AP  mobilizations, grade I-II, 2 x 30s; Supine manual right hip long axis distraction mobilizations 2 x 30s; Supine right hip medial to lateral mobilizations with hip flexed to 45 degrees, grade I-II, 2 x 30s;   PATIENT EDUCATION:  Education details: Plan of care and HEP progressions Person educated: Patient Education method: Explanation Education comprehension: verbalized understanding and returned demonstration   HOME EXERCISE PROGRAM:  Access Code: [email or web address] URL: [email or web address] Date: 05/03/2022 Prepared by: Roxana Hires  Exercises - Supine Lower Trunk Rotation  - 1 x daily - 7 x weekly - 2 reps - 30s hold - Sidelying Hip Abduction  - 1 x daily - 7 x weekly - 2 sets - 10 reps - 3s hold - Supine Bridge  - 1 x daily - 7 x weekly - 2 sets - 10 reps - 3s hold - Seated Hip Abduction with Resistance  - 1 x daily - 7 x weekly - 2 sets - 10 reps - 3s hold - Seated Hip Adduction Isometrics with Ball  - 1 x daily - 7 x weekly - 2 sets - 10 reps - 3s hold - Mini Squat with Counter Support  - 1 x daily - 7 x weekly - 2 sets - 10 reps - Seated Hip Flexor Stretch  - 1 x daily - 7 x weekly - 3 reps - 30-45s hold - Seated Hamstring Stretch  - 1 x daily - 7 x weekly - 3 reps - 30-45s hold   ASSESSMENT:  CLINICAL IMPRESSION: Continued manual techniques for right hip pain during session today but limited manual therapy in order to focus on strengthening. Pt has an appointment next week with the orthopedic surgeon and will contact the therapy clinic afterward to notify of plan of care. Future therapy decisions will depend on his conversation with the orthopedic surgeon. Pt encouraged to continue HEP including gym-based exercises which he can perform at the fitness center of his complex.  OBJECTIVE IMPAIRMENTS: Abnormal gait, decreased ROM, decreased strength, and pain.   ACTIVITY LIMITATIONS: bending, sitting, and squatting  PARTICIPATION LIMITATIONS: community  activity  PERSONAL FACTORS: 3+ comorbidities: CLL, OSA, OA, and neuropathy  are also affecting patient's functional outcome.   REHAB POTENTIAL: Good  CLINICAL DECISION MAKING: Evolving/moderate complexity  EVALUATION COMPLEXITY: Low   GOALS: Goals reviewed with patient? No  SHORT TERM GOALS: Target date: 04/30/2022  Pt will be independent with HEP in order to improve strength and decrease hip pain to improve pain-free function at home. Baseline:  Goal status: ONGOING   LONG TERM GOALS: Target date: 05/24/2022   Pt will increase FOTO to at least 61 to demonstrate significant improvement in function at home and work related to back pain  Baseline: 03/29/22: 38 Goal status: DEFERRED  2.  Pt will decrease worst hip pain by at least 3 points on the NPRS in order to demonstrate clinically significant reduction in hip pain. Baseline: 03/29/22: worst: 8/10; 05/08/22: worst: 8/10; Goal status: ONGOING  3.  Pt will decrease mODI score by at least 13 points in order demonstrate clinically significant reduction in back pain/disability.       Baseline: 03/29/22: To be completed Goal status: DISCONTINUED  4.  Pt will report at least 75% improvement in his hip symptoms in order to return to walking and hiking without notable increase in his pain.  Baseline: 05/08/22: Gradualy worsening Goal status: ONGOING   PLAN: PT FREQUENCY: 1-2x/week  PT DURATION: 8 weeks  PLANNED INTERVENTIONS: Therapeutic exercises, Therapeutic activity, Neuromuscular re-education, Balance training, Gait training, Patient/Family education, Self Care, Joint mobilization, Joint manipulation, Vestibular training, Canalith repositioning, Orthotic/Fit training, DME instructions, Dry Needling, Electrical stimulation, Spinal manipulation, Spinal mobilization, Cryotherapy, Moist heat, Taping, Traction, Ultrasound, Ionotophoresis '4mg'$ /ml Dexamethasone, Manual therapy, and Re-evaluation.  PLAN FOR NEXT SESSION: continue  strengthening for hip depending on meeting with orthopedic surgeon, review/modify HEP as needed    Lyndel Safe Jocee Kissick PT, DPT, GCS  Tiago Humphrey, PT 05/19/2022, 2:42 PM

## 2022-05-22 ENCOUNTER — Ambulatory Visit
Admission: RE | Admit: 2022-05-22 | Discharge: 2022-05-22 | Disposition: A | Discharge status: Home / Self Care | Payer: Medicare Other | Source: Ambulatory Visit | Attending: Urology | Admitting: Urology

## 2022-05-22 DIAGNOSIS — R3129 Other microscopic hematuria: Secondary | ICD-10-CM | POA: Diagnosis present

## 2022-05-22 MED ORDER — IOHEXOL 300 MG/ML  SOLN
125.0000 mL | Freq: Once | INTRAMUSCULAR | Status: AC | PRN
  Administered 2022-05-22: 125 mL via INTRAVENOUS

## 2022-06-05 ENCOUNTER — Encounter: Payer: Self-pay | Admitting: Urology

## 2022-06-05 ENCOUNTER — Ambulatory Visit (INDEPENDENT_AMBULATORY_CARE_PROVIDER_SITE_OTHER): Payer: Medicare Other | Admitting: Urology

## 2022-06-05 VITALS — BP 146/77 | HR 57 | Ht 70.0 in | Wt 208.0 lb

## 2022-06-05 DIAGNOSIS — R3121 Asymptomatic microscopic hematuria: Secondary | ICD-10-CM

## 2022-06-05 MED ORDER — LIDOCAINE HCL URETHRAL/MUCOSAL 2 % EX GEL: 1.0000 | Freq: Once | CUTANEOUS | Status: DC

## 2022-06-05 NOTE — Progress Notes (Signed)
Patient canceled cystoscopy today as he is scheduled for hip replacement next week and did not want to risk possible gross hematuria or infection.  In the setting of a normal CT of think it is reasonable to delay cystoscopy 2 to 3 months until recovered from hip replacement  Nickolas Madrid, MD 06/05/2022

## 2022-06-05 NOTE — Patient Instructions (Signed)

## 2022-06-12 ENCOUNTER — Other Ambulatory Visit: Payer: Medicare Other | Admitting: Urology

## 2022-07-02 ENCOUNTER — Ambulatory Visit: Payer: Medicare Other | Attending: Orthopedic Surgery

## 2022-07-02 DIAGNOSIS — M25551 Pain in right hip: Secondary | ICD-10-CM

## 2022-07-02 NOTE — Therapy (Unsigned)
OUTPATIENT PHYSICAL THERAPY HIP EVALUATION  Patient Name: Chad Gaines MRN: [id number] DOB:[birth year], 77 y.o., male Today's Date: 07/04/2022  END OF SESSION:  PT End of Session - 07/04/22 0813     Visit Number 1    Number of Visits 25    Date for PT Re-Evaluation 09/24/22    Authorization Type Medicare / BCBS 2024  [id number] on [place]  no auth req    Authorization Time Period eval: 07/02/22    PT Start Time 1150    PT Stop Time 1235    PT Time Calculation (min) 45 min    Equipment Utilized During Treatment Gait belt    Activity Tolerance Patient tolerated treatment well    Behavior During Therapy WFL for tasks assessed/performed            Past Medical History:  Diagnosis Date   Allergy to alpha-gal    GERD (gastroesophageal reflux disease)    Hypertension    Pinched nerve    some numbness R arm, R ankle   Sleep apnea    CPAP   Past Surgical History:  Procedure Laterality Date   APPENDECTOMY     CATARACT EXTRACTION W/PHACO Left 07/03/2021   Procedure: CATARACT EXTRACTION PHACO AND INTRAOCULAR LENS PLACEMENT (IOC) LEFT VIVITY TORIC 3.59 00:27.8;  Surgeon: Eulogio Bear, MD;  Location: [place];  Service: Ophthalmology;  Laterality: Left;  sleep apnea   CATARACT EXTRACTION W/PHACO Right 07/17/2021   Procedure: CATARACT EXTRACTION PHACO AND INTRAOCULAR LENS PLACEMENT (IOC) RIGHT VIVITY TORIC 4.20 00:33.2;  Surgeon: Eulogio Bear, MD;  Location: [place];  Service: Ophthalmology;  Laterality: Right;  sleep apnea   NASAL SEPTUM SURGERY     Patient Active Problem List   Diagnosis Date Noted   Adult hypothyroidism 12/25/2014   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   Apnea, sleep 12/25/2014   Essential (primary) hypertension 08/05/2014   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[place]) 03/08/2014   Chronic lymphocytic leukemia ([place]) 03/08/2014   PCP: Emily Filbert MD  REFERRING PROVIDER: Mardi Mainland MD  REFERRING DIAG:  Diagnosis  M16.11  (ICD-10-CM) - Unilateral primary osteoarthritis, right hip   RATIONALE FOR EVALUATION AND TREATMENT: Rehabilitation  THERAPY DIAG: Pain in right hip  ONSET DATE: Multiple years  FOLLOW-UP APPT SCHEDULED WITH REFERRING PROVIDER: Yes    SUBJECTIVE:                                                                                                                                                                                         SUBJECTIVE STATEMENT:  R hip pain  PERTINENT HISTORY:  Pt underwent R posterior approach THR on  06/12/22 without reported post-op complications. He was discharged on baby ASA for VTE and has been ambulating with a front wheeled walker since the surgery. He has tried using a rollator and notes that it is easier to use for walking however reports that it causes him to lean forward excessively. He has been off Tramadol for the last week and is using Tylenol to manage the pain. He reports that his sleep has been very disrupted since the surgery. He is also complaining of diarrhea since the surgery as well as mild DOE with activity. No falls reported. No chills, fevers, night sweats. No erythema, drainage, or LE swelling since the surgery. His post-op follow-up went well and staples/sutures were removed with instructions to follow-up in 4-6 weeks. Pt advised to wait at least 2 weeks for therapy after surgery and was given HEP by orthopedics which he is performing including quad sets, glut sets, ankle pumps, mini squats, and standing hip abduction.  History from PT evaluation 03/29/22: Pt reports R hip pain for multiple years with progressive worsening. Pain was initially in the anterior groin and posterior hip however the anterior hip pain has mostly resolved at this time with medication. He is now limping and walking with a hiking pole. Pain worsens throughout the day. Pt reports progressive RLE weakness however he has not had any falls. Posterior hip pain has been nonresponsive  to anti-inflammatories, oral steroids, gabapentin, and cortisone injections. Plain x-ray showed L5-S1 disc narrowing. Lumbar MRI ordered. "It has started to define my life." He used to like to walk and hike but hasn't been able to lately because of the pain. History of cervical pain and RUE radiculopathy.    PAIN:    Pain Intensity: Present: 0/10, Best: 0/10, Worst: 7/10 Pain location: Anterior and posterior R hip (mostly posterior) Pain Quality: sharp with transitions, occasional jabbing pain  Radiating: Yes, posterolateral RLE into the thigh but no longer down to the foot/ankle Numbness/Tingling: Yes, "prickly" in R thigh; Focal Weakness: Yes, RLE weakness since surgery related to pain; Aggravating factors: quick transitions, walking, in and out of bed; Relieving factors: History of prior back injury, pain, surgery, or therapy: No Dominant hand: right Imaging: Yes, see history Red flags: Positive for history of cancer (CLL), diarrhea since surgery, negative for bowel/bladder changes, saddle paresthesia, h/o spinal tumors, h/o compression fx, h/o abdominal aneurysm, chills/fever, night sweats, nausea, vomiting;  PRECAUTIONS: Posterior hip  WEIGHT BEARING RESTRICTIONS: No  FALLS: Has patient fallen in last 6 months? 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   Hobbies: Reading, walking, hiking, pastoral care;  Patient Goals: Pt would like to decrease his pain and be able to return to walking and hiking for leisure   OBJECTIVE: BP: 120/68 mmHg, HR: 68, SpO2: 98%;  Patient Surveys  FOTO 38, predicted improvement to 22 HOOS JR: to be  completed;  Cognition Patient is oriented to person, place, and time.  Recent memory is intact.  Remote memory is intact.  Attention span and concentration are intact.  Expressive speech is intact.  Patient's fund of knowledge is within normal limits for educational level.    Gross Musculoskeletal Assessment Tremor: None Bulk: Normal Tone: Normal Incision  is clean and dry and appears to be healing well. No erythema, edema, discharge, or ecchymosis noted around incision  GAIT: Antalgic gait on the RLE. Pt ambulates with combination of step-to and partial step through pattern with front wheeled walker. When practicing with rollator his gait is more normalized with step-through pattern.   Posture: Lumbar lordosis: WNL Iliac crest height: Equal bilaterally Lumbar lateral shift: Negative  AROM Deferred at this time. Time spent instructing pt in posterior hip precautions;  LE MMT: MMT (out of 5) Right  Left   Hip flexion 4* 5  Hip extension    Hip abduction (seated) 4+* 5  Hip adduction (seated) 4+* 5  Hip internal rotation (testing at neural) 5* 5  Hip external rotation 5* 5  Knee flexion (seated) 4+* 5  Knee extension 5 5  Ankle dorsiflexion 5 5  Ankle plantarflexion Strong Strong  Ankle inversion    Ankle eversion    (* = pain; Blank rows = not tested)  Sensation WNL bilaterally L5-S1. Proprioception, stereognosis, and hot/cold testing deferred on this date.  Reflexes Deferred  Muscle Length Deferred  Palpation Deferred  Functional Outcome Measures  Results Comments  BERG    DGI    FGA    TUG  28.3 seconds Increased fall risk, decreased function;  5TSTS     6 Minute Walk Test    10 Meter Gait Speed Self-selected: 17.5s = 0.57 m/s; Fastest: 9.4s = 1.06 m/s Self-selected gait speed below normative values for community ambulation;  (Blank rows = not tested)   TODAY'S TREATMENT: Deferred    PATIENT EDUCATION:  Education details: Plan of care,  continue current HEP which will be modified at next session, extensive education about posterior hip precautions Person educated: Patient Education method: Explanation, demonstration, and handout (posterior hip precautions); Education comprehension: verbalized understanding   HOME EXERCISE PROGRAM:  Access Code: [email or web address] URL: [email or web address] Date: 07/02/2022 Prepared by: Roxana Hires  Patient Education - THA Posterior Precautions Handout   ASSESSMENT:  CLINICAL IMPRESSION: Patient is a 77 y.o. male who was seen today for physical therapy evaluation and treatment s/p R hip posterior THR. He is making excellent progress since surgery and will benefit from skilled PT services in order to improve gait, strength, and pain in order to improve function at home and with leisure activities such as hiking.  OBJECTIVE IMPAIRMENTS: Abnormal gait, decreased strength, and pain.   ACTIVITY LIMITATIONS: bending, sitting, squatting, and transfers  PARTICIPATION LIMITATIONS: cleaning, driving, shopping, and community activity  PERSONAL FACTORS: 3+ comorbidities: CLL, OSA, OA, and neuropathy  are also affecting patient's functional outcome.   REHAB POTENTIAL: Excellent  CLINICAL DECISION MAKING: Evolving/moderate complexity  EVALUATION COMPLEXITY: Moderate   GOALS: Goals reviewed with patient? No  SHORT TERM GOALS: Target date: 08/13/2022   Pt will be independent with HEP in order to improve strength and decrease hip pain to improve pain-free function at home. Baseline:  Goal status: INITIAL   LONG TERM GOALS: Target date: 09/24/2022   Pt will increase FOTO to at least 58 to demonstrate significant improvement in function at home and with leisure activities related to hip pain  Baseline: 07/02/22: 31; Goal status: INITIAL  2.  Pt will decrease worst hip pain by at least 3 points on the NPRS in order to demonstrate clinically significant reduction in hip  pain. Baseline: 07/01/21: worst: 7/10; Goal status: INITIAL  3.  Pt will increase HOOS Jr score by at least 18 points in order demonstrate clinically significant improvement  in R hip pain and function      Baseline: 07/02/22: To be completed Goal status: INITIAL  4.  Pt will report at least 75% improvement in his hip symptoms in order to return to walking and hiking without notable increase in his pain.  Baseline:  Goal status: INITIAL   PLAN: PT FREQUENCY: 1-2x/week  PT DURATION: 12 weeks  PLANNED INTERVENTIONS: Therapeutic exercises, Therapeutic activity, Neuromuscular re-education, Balance training, Gait training, Patient/Family education, Self Care, Joint mobilization, Joint manipulation, Vestibular training, Canalith repositioning, Orthotic/Fit training, DME instructions, Dry Needling, Electrical stimulation, Spinal manipulation, Spinal mobilization, Cryotherapy, Moist heat, Taping, Traction, Ultrasound, Ionotophoresis 4mg /ml Dexamethasone, Manual therapy, and Re-evaluation.  PLAN FOR NEXT SESSION: Have pt complete HOOS Jr questionnare, review and modify HEP, progress strengthening and normalize gait;  Lyndel Safe [place] PT, DPT, GCS  Arun Herrod, PT 07/04/2022, 10:49 AM

## 2022-07-05 ENCOUNTER — Ambulatory Visit: Payer: Medicare Other

## 2022-07-05 DIAGNOSIS — M25551 Pain in right hip: Secondary | ICD-10-CM

## 2022-07-05 NOTE — Therapy (Signed)
OUTPATIENT PHYSICAL THERAPY HIP TREATMENT  Patient Name: Chad Gaines MRN: [id number] DOB:July 07, 1945, 77 y.o., male Today's Date: 07/05/2022  END OF SESSION:  PT End of Session - 07/05/22 2206     Visit Number 2    Number of Visits 25    Date for PT Re-Evaluation 09/24/22    Authorization Type Medicare / BCBS 2024  [id number] on [place]  no auth req    Authorization Time Period eval: 07/02/22    PT Start Time 1140    PT Stop Time 1230    PT Time Calculation (min) 50 min    Equipment Utilized During Treatment Gait belt    Activity Tolerance Patient tolerated treatment well    Behavior During Therapy WFL for tasks assessed/performed            Past Medical History:  Diagnosis Date   Allergy to alpha-gal    GERD (gastroesophageal reflux disease)    Hypertension    Pinched nerve    some numbness R arm, R ankle   Sleep apnea    CPAP   Past Surgical History:  Procedure Laterality Date   APPENDECTOMY     CATARACT EXTRACTION W/PHACO Left 07/03/2021   Procedure: CATARACT EXTRACTION PHACO AND INTRAOCULAR LENS PLACEMENT (IOC) LEFT VIVITY TORIC 3.59 00:27.8;  Surgeon: Eulogio Bear, MD;  Location: [place];  Service: Ophthalmology;  Laterality: Left;  sleep apnea   CATARACT EXTRACTION W/PHACO Right 07/17/2021   Procedure: CATARACT EXTRACTION PHACO AND INTRAOCULAR LENS PLACEMENT (IOC) RIGHT VIVITY TORIC 4.20 00:33.2;  Surgeon: Eulogio Bear, MD;  Location: [place];  Service: Ophthalmology;  Laterality: Right;  sleep apnea   NASAL SEPTUM SURGERY     Patient Active Problem List   Diagnosis Date Noted   Adult hypothyroidism 12/25/2014   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   Apnea, sleep 12/25/2014   Essential (primary) hypertension 08/05/2014   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[place]) 03/08/2014   Chronic lymphocytic leukemia ([place]) 03/08/2014   PCP: Emily Filbert MD  REFERRING PROVIDER: Mardi Mainland MD  REFERRING DIAG:  Diagnosis  M16.11  (ICD-10-CM) - Unilateral primary osteoarthritis, right hip   RATIONALE FOR EVALUATION AND TREATMENT: Rehabilitation  THERAPY DIAG: Pain in right hip  ONSET DATE: Multiple years  FOLLOW-UP APPT SCHEDULED WITH REFERRING PROVIDER: Yes   FROM INITIAL EVALUATION SUBJECTIVE:                                                                                                                                                                                         SUBJECTIVE STATEMENT:  R hip pain  PERTINENT HISTORY:  Pt underwent R posterior approach  THR on 06/12/22 without reported post-op complications. He was discharged on baby ASA for VTE and has been ambulating with a front wheeled walker since the surgery. He has tried using a rollator and notes that it is easier to use for walking however reports that it causes him to lean forward excessively. He has been off Tramadol for the last week and is using Tylenol to manage the pain. He reports that his sleep has been very disrupted since the surgery. He is also complaining of diarrhea since the surgery as well as mild DOE with activity. No falls reported. No chills, fevers, night sweats. No erythema, drainage, or LE swelling since the surgery. His post-op follow-up went well and staples/sutures were removed with instructions to follow-up in 4-6 weeks. Pt advised to wait at least 2 weeks for therapy after surgery and was given HEP by orthopedics which he is performing including quad sets, glut sets, ankle pumps, mini squats, and standing hip abduction.  History from PT evaluation 03/29/22: Pt reports R hip pain for multiple years with progressive worsening. Pain was initially in the anterior groin and posterior hip however the anterior hip pain has mostly resolved at this time with medication. He is now limping and walking with a hiking pole. Pain worsens throughout the day. Pt reports progressive RLE weakness however he has not had any falls. Posterior hip pain  has been nonresponsive to anti-inflammatories, oral steroids, gabapentin, and cortisone injections. Plain x-ray showed L5-S1 disc narrowing. Lumbar MRI ordered. "It has started to define my life." He used to like to walk and hike but hasn't been able to lately because of the pain. History of cervical pain and RUE radiculopathy.    PAIN:    Pain Intensity: Present: 0/10, Best: 0/10, Worst: 7/10 Pain location: Anterior and posterior R hip (mostly posterior) Pain Quality: sharp with transitions, occasional jabbing pain  Radiating: Yes, posterolateral RLE into the thigh but no longer down to the foot/ankle Numbness/Tingling: Yes, "prickly" in R thigh; Focal Weakness: Yes, RLE weakness since surgery related to pain; Aggravating factors: quick transitions, walking, in and out of bed; Relieving factors: History of prior back injury, pain, surgery, or therapy: No Dominant hand: right Imaging: Yes, see history Red flags: Positive for history of cancer (CLL), diarrhea since surgery, negative for bowel/bladder changes, saddle paresthesia, h/o spinal tumors, h/o compression fx, h/o abdominal aneurysm, chills/fever, night sweats, nausea, vomiting;  PRECAUTIONS: Posterior hip  WEIGHT BEARING RESTRICTIONS: No  FALLS: Has patient fallen in last 6 months? 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   Hobbies: Reading, walking, hiking, pastoral care;  Patient Goals: Pt would like to decrease his pain and be able to return to walking and hiking for leisure   OBJECTIVE: BP: 120/68 mmHg, HR: 68, SpO2: 98%;  Patient Surveys  FOTO 38, predicted improvement to 74 HOOS JR: to be  completed;  Cognition Patient is oriented to person, place, and time.  Recent memory is intact.  Remote memory is intact.  Attention span and concentration are intact.  Expressive speech is intact.  Patient's fund of knowledge is within normal limits for educational level.    Gross Musculoskeletal Assessment Tremor: None Bulk: Normal Tone:  Normal Incision is clean and dry and appears to be healing well. No erythema, edema, discharge, or ecchymosis noted around incision  GAIT: Antalgic gait on the RLE. Pt ambulates with combination of step-to and partial step through pattern with front wheeled walker. When practicing with rollator his gait is more normalized with step-through pattern.   Posture: Lumbar lordosis: WNL Iliac crest height: Equal bilaterally Lumbar lateral shift: Negative  AROM Deferred at this time. Time spent instructing pt in posterior hip precautions;  LE MMT: MMT (out of 5) Right  Left   Hip flexion 4* 5  Hip extension    Hip abduction (seated) 4+* 5  Hip adduction (seated) 4+* 5  Hip internal rotation (testing at neural) 5* 5  Hip external rotation 5* 5  Knee flexion (seated) 4+* 5  Knee extension 5 5  Ankle dorsiflexion 5 5  Ankle plantarflexion Strong Strong  Ankle inversion    Ankle eversion    (* = pain; Blank rows = not tested)  Sensation WNL bilaterally L5-S1. Proprioception, stereognosis, and hot/cold testing deferred on this date.  Functional Outcome Measures  Results Comments  BERG    DGI    FGA    TUG  28.3 seconds Increased fall risk, decreased function;  5TSTS     6 Minute Walk Test    10 Meter Gait Speed Self-selected: 17.5s = 0.57 m/s; Fastest: 9.4s = 1.06 m/s Self-selected gait speed below normative values for community ambulation;  (Blank rows = not tested)   TODAY'S TREATMENT   Ther-ex  Hooklying R SLR 2 x 10, maintaining hip precautions; Hooklying R straight leg manually resisted hip abduction/adduction 2 x 10  each; Supine R heel slides with manually resisted extension 2 x 10; Hooklying bridges 2 x 10; Hooklying isometric clams 2 x 10; Hooklying isometric ball squeeze 2 x 10; Seated R LAQ with manualy resisted 2 x 10; Seated R hamstring curls with blue tband 2 x 10; Standing mini squats x 10; HEP updated and reviewed;   Therapeutic Activity Practiced gait in // bars with single point cane in LUE with step-through gait pattern. Performed multiple laps with cues for cane and foot placement. Progressed to gait in gym and hallway. No LOB or RLE buckling noted; Instructed pt in stair ascend/descend with 2 and then 1 rail for support and step-to pattern. Four steps performed four times;   PATIENT EDUCATION:  Education details: Pt educated throughout session about proper posture and technique with exercises. Improved exercise technique, movement at target joints, use of target muscles after min to mod verbal, visual, tactile cues. Gait training, HEP; Person educated: Patient Education method: Explanation, demonstration, and handout; Education comprehension: verbalized understanding and returned demonstration;   HOME EXERCISE PROGRAM:  Access Code: [email or web address] URL: [email or web address] Date: 07/05/2022 Prepared by: Roxana Hires  Exercises - Supine Bridge  - 1 x daily - 7 x weekly - 2 sets - 10 reps - 3s hold - Hooklying Straight Leg Raise  - 1 x daily - 7 x weekly - 2 sets - 10 reps - 3s hold - Mini Squat with Counter Support  - 1 x daily - 7 x weekly - 2 sets - 10 reps - Standing Knee Flexion with Counter Support (Mirrored)  - 1 x daily - 7 x weekly - 2 sets - 10 reps - Heel Raises with Counter Support  - 1 x daily - 7 x weekly - 2 sets - 10 reps  Patient Education - THA Posterior Precautions Handout  ASSESSMENT:  CLINICAL IMPRESSION: Pt demonstrates excellent motivation during session today. Initiated strengthening and performed gait training with single point cane. No  LOB or RLE buckling noted during gait training. Also performed stair training today and issued HEP. Pt encouraged to follow-up as scheduled. He is making excellent progress since surgery and will benefit from skilled PT services in order to improve gait, strength, and pain in order to improve function at home and with leisure activities such as hiking.  OBJECTIVE IMPAIRMENTS: Abnormal gait, decreased strength, and pain.   ACTIVITY LIMITATIONS: bending, sitting, squatting, and transfers  PARTICIPATION LIMITATIONS: cleaning, driving, shopping, and community activity  PERSONAL FACTORS: 3+ comorbidities: CLL, OSA, OA, and neuropathy  are also affecting patient's functional outcome.   REHAB POTENTIAL: Excellent  CLINICAL DECISION MAKING: Evolving/moderate complexity  EVALUATION COMPLEXITY: Moderate   GOALS: Goals reviewed with patient? No  SHORT TERM GOALS: Target date: 08/13/2022   Pt will be independent with HEP in order to improve strength and decrease hip pain to improve pain-free function at home. Baseline:  Goal status: INITIAL   LONG TERM GOALS: Target date: 09/24/2022   Pt will increase FOTO to at least 58 to demonstrate significant improvement in function at home and with leisure activities related to hip pain  Baseline: 07/02/22: 31; Goal status: INITIAL  2.  Pt will decrease worst hip pain by at least 3 points on the NPRS in order to demonstrate clinically significant reduction in hip pain. Baseline: 07/01/21: worst: 7/10; Goal status: INITIAL  3.  Pt will increase HOOS Jr score by at least 18 points in order demonstrate clinically significant improvement in R hip pain and function      Baseline: 07/02/22: To be completed;  Goal status: INITIAL  4.  Pt will report at least 75% improvement in his hip symptoms in order to return to walking and hiking without notable increase in his pain.  Baseline:  Goal status: INITIAL   PLAN: PT FREQUENCY: 1-2x/week  PT DURATION: 12  weeks  PLANNED INTERVENTIONS: Therapeutic exercises, Therapeutic activity, Neuromuscular re-education, Balance training, Gait training, Patient/Family education, Self Care, Joint mobilization, Joint manipulation, Vestibular training, Canalith repositioning, Orthotic/Fit training, DME instructions, Dry Needling, Electrical stimulation, Spinal manipulation, Spinal mobilization, Cryotherapy, Moist heat, Taping, Traction, Ultrasound, Ionotophoresis 4mg /ml Dexamethasone, Manual therapy, and Re-evaluation.  PLAN FOR NEXT SESSION: Have pt complete HOOS Jr questionnare, review and modify HEP, progress strengthening and normalize gait;  Lyndel Safe [place]ana [place] PT, DPT, GCS  Marionna Gonia, PT 07/05/2022, 10:25 PM

## 2022-07-09 ENCOUNTER — Ambulatory Visit: Payer: Medicare Other

## 2022-07-09 DIAGNOSIS — M25551 Pain in right hip: Secondary | ICD-10-CM

## 2022-07-09 NOTE — Therapy (Signed)
OUTPATIENT PHYSICAL THERAPY HIP TREATMENT  Patient Name: Chad Gaines MRN: [id number] DOB:10/30/1945, 77 y.o., male Today's Date: 07/09/2022  END OF SESSION:  PT End of Session - 07/09/22 0930     Visit Number 3    Number of Visits 25    Date for PT Re-Evaluation 09/24/22    Authorization Type Medicare / BCBS 2024  [id number] on [place]  no auth req    Authorization Time Period eval: 07/02/22    PT Start Time 0932    PT Stop Time 1015    PT Time Calculation (min) 43 min    Equipment Utilized During Treatment Gait belt    Activity Tolerance Patient tolerated treatment well    Behavior During Therapy WFL for tasks assessed/performed            Past Medical History:  Diagnosis Date   Allergy to alpha-gal    GERD (gastroesophageal reflux disease)    Hypertension    Pinched nerve    some numbness R arm, R ankle   Sleep apnea    CPAP   Past Surgical History:  Procedure Laterality Date   APPENDECTOMY     CATARACT EXTRACTION W/PHACO Left 07/03/2021   Procedure: CATARACT EXTRACTION PHACO AND INTRAOCULAR LENS PLACEMENT (IOC) LEFT VIVITY TORIC 3.59 00:27.8;  Surgeon: Eulogio Bear, MD;  Location: [place];  Service: Ophthalmology;  Laterality: Left;  sleep apnea   CATARACT EXTRACTION W/PHACO Right 07/17/2021   Procedure: CATARACT EXTRACTION PHACO AND INTRAOCULAR LENS PLACEMENT (IOC) RIGHT VIVITY TORIC 4.20 00:33.2;  Surgeon: Eulogio Bear, MD;  Location: [place];  Service: Ophthalmology;  Laterality: Right;  sleep apnea   NASAL SEPTUM SURGERY     Patient Active Problem List   Diagnosis Date Noted   Adult hypothyroidism 12/25/2014   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   Apnea, sleep 12/25/2014   Essential (primary) hypertension 08/05/2014   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[place]) 03/08/2014   Chronic lymphocytic leukemia ([place]) 03/08/2014   PCP: Emily Filbert MD  REFERRING PROVIDER: Mardi Mainland MD  REFERRING DIAG:  Diagnosis  M16.11  (ICD-10-CM) - Unilateral primary osteoarthritis, right hip   RATIONALE FOR EVALUATION AND TREATMENT: Rehabilitation  THERAPY DIAG: Pain in right hip  ONSET DATE: Multiple years  FOLLOW-UP APPT SCHEDULED WITH REFERRING PROVIDER: Yes   FROM INITIAL EVALUATION SUBJECTIVE:                                                                                                                                                                                         SUBJECTIVE STATEMENT:  R hip pain  PERTINENT HISTORY:  Pt underwent R posterior approach  THR on 06/12/22 without reported post-op complications. He was discharged on baby ASA for VTE and has been ambulating with a front wheeled walker since the surgery. He has tried using a rollator and notes that it is easier to use for walking however reports that it causes him to lean forward excessively. He has been off Tramadol for the last week and is using Tylenol to manage the pain. He reports that his sleep has been very disrupted since the surgery. He is also complaining of diarrhea since the surgery as well as mild DOE with activity. No falls reported. No chills, fevers, night sweats. No erythema, drainage, or LE swelling since the surgery. His post-op follow-up went well and staples/sutures were removed with instructions to follow-up in 4-6 weeks. Pt advised to wait at least 2 weeks for therapy after surgery and was given HEP by orthopedics which he is performing including quad sets, glut sets, ankle pumps, mini squats, and standing hip abduction.  History from PT evaluation 03/29/22: Pt reports R hip pain for multiple years with progressive worsening. Pain was initially in the anterior groin and posterior hip however the anterior hip pain has mostly resolved at this time with medication. He is now limping and walking with a hiking pole. Pain worsens throughout the day. Pt reports progressive RLE weakness however he has not had any falls. Posterior hip pain  has been nonresponsive to anti-inflammatories, oral steroids, gabapentin, and cortisone injections. Plain x-ray showed L5-S1 disc narrowing. Lumbar MRI ordered. "It has started to define my life." He used to like to walk and hike but hasn't been able to lately because of the pain. History of cervical pain and RUE radiculopathy.    PAIN:    Pain Intensity: Present: 0/10, Best: 0/10, Worst: 7/10 Pain location: Anterior and posterior R hip (mostly posterior) Pain Quality: sharp with transitions, occasional jabbing pain  Radiating: Yes, posterolateral RLE into the thigh but no longer down to the foot/ankle Numbness/Tingling: Yes, "prickly" in R thigh; Focal Weakness: Yes, RLE weakness since surgery related to pain; Aggravating factors: quick transitions, walking, in and out of bed; Relieving factors: History of prior back injury, pain, surgery, or therapy: No Dominant hand: right Imaging: Yes, see history Red flags: Positive for history of cancer (CLL), diarrhea since surgery, negative for bowel/bladder changes, saddle paresthesia, h/o spinal tumors, h/o compression fx, h/o abdominal aneurysm, chills/fever, night sweats, nausea, vomiting;  PRECAUTIONS: Posterior hip  WEIGHT BEARING RESTRICTIONS: No  FALLS: Has patient fallen in last 6 months? 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   Hobbies: Reading, walking, hiking, pastoral care;  Patient Goals: Pt would like to decrease his pain and be able to return to walking and hiking for leisure   OBJECTIVE: BP: 120/68 mmHg, HR: 68, SpO2: 98%;  Patient Surveys  FOTO 38, predicted improvement to 18 HOOS JR: to be  completed;  Cognition Patient is oriented to person, place, and time.  Recent memory is intact.  Remote memory is intact.  Attention span and concentration are intact.  Expressive speech is intact.  Patient's fund of knowledge is within normal limits for educational level.    Gross Musculoskeletal Assessment Tremor: None Bulk: Normal Tone:  Normal Incision is clean and dry and appears to be healing well. No erythema, edema, discharge, or ecchymosis noted around incision  GAIT: Antalgic gait on the RLE. Pt ambulates with combination of step-to and partial step through pattern with front wheeled walker. When practicing with rollator his gait is more normalized with step-through pattern.   Posture: Lumbar lordosis: WNL Iliac crest height: Equal bilaterally Lumbar lateral shift: Negative  AROM Deferred at this time. Time spent instructing pt in posterior hip precautions;  LE MMT: MMT (out of 5) Right  Left   Hip flexion 4* 5  Hip extension    Hip abduction (seated) 4+* 5  Hip adduction (seated) 4+* 5  Hip internal rotation (testing at neural) 5* 5  Hip external rotation 5* 5  Knee flexion (seated) 4+* 5  Knee extension 5 5  Ankle dorsiflexion 5 5  Ankle plantarflexion Strong Strong  Ankle inversion    Ankle eversion    (* = pain; Blank rows = not tested)  Sensation WNL bilaterally L5-S1. Proprioception, stereognosis, and hot/cold testing deferred on this date.  Functional Outcome Measures  Results Comments  BERG    DGI    FGA    TUG  28.3 seconds Increased fall risk, decreased function;  5TSTS     6 Minute Walk Test    10 Meter Gait Speed Self-selected: 17.5s = 0.57 m/s; Fastest: 9.4s = 1.06 m/s Self-selected gait speed below normative values for community ambulation;  (Blank rows = not tested)   TODAY'S TREATMENT   SUBJECTIVE: Pt reports that he is doing well today. He denies any resting pain upon arrival but still has pain during ambulation. However overall  pain is significantly improved compared to before surgery.   PAIN: Denies resting pain, pain increases with walking;   Ther-ex  NuStep L2-3 x 10 minutes at start of session while obtaining interval history for warm-up and BLE strengthening with therapist monitoring fatigue and adjusting resistance accordingly (5 minutes unbilled); Hooklying R SLR x 10 with gentle assist, maintaining hip precautions but increase in pain so regressed to hooklying marches; Hooklying alternating marches x 10 BLE; Hooklying R straight leg manually resisted hip abduction/adduction 2 x 10 each; Supine R heel slides with manually resisted extension 2 x 10; Hooklying bridges 2 x 10; Hooklying clams 2 x 10; Hooklying adduction squeeze with manual resistance 2 x 10, not crossing midline;; Standing mini squats 2 x 10; Standing heel raises 2 x 10; Standing hip abduction 2 x 10 BLE;   Therapeutic Activity Instructed pt in stair ascend/descend with 1 rail and single point cane using step-to pattern. Utilized teach back method. Four steps performed five times. Simulated home environment with rail on L side during ascend and R side during descend;   Not performed: Seated R LAQ with manualy resisted 2 x 10; Seated R hamstring curls with blue tband 2 x 10;   PATIENT EDUCATION:  Education details: Pt educated throughout session about proper posture and technique with exercises. Improved exercise technique, movement at target joints, use of target muscles after min to mod verbal, visual, tactile cues. stair trainin; Person educated: Patient Education method: Explanation, demonstration Education comprehension: verbalized understanding and returned demonstration;   HOME EXERCISE PROGRAM:  Access Code: [email or web address] URL: [email or web address] Date: 07/05/2022 Prepared by: Roxana Hires  Exercises - Supine Bridge  - 1 x daily - 7 x weekly - 2 sets - 10 reps - 3s hold - Hooklying Straight Leg Raise  - 1 x  daily -  7 x weekly - 2 sets - 10 reps - 3s hold - Mini Squat with Counter Support  - 1 x daily - 7 x weekly - 2 sets - 10 reps - Standing Knee Flexion with Counter Support (Mirrored)  - 1 x daily - 7 x weekly - 2 sets - 10 reps - Heel Raises with Counter Support  - 1 x daily - 7 x weekly - 2 sets - 10 reps  Patient Education - THA Posterior Precautions Handout   ASSESSMENT:  CLINICAL IMPRESSION: Pt demonstrates excellent motivation during session today. Progressed strengthening and repeated stair training with single point cane. No LOB or RLE buckling noted during stairs. Regressed HEP to hooklying marches due to notable increase in R hip pain as well as difficulty with SLR. Pt encouraged to follow-up as scheduled. Plan to progress strengthening at future visit. He is making excellent progress since surgery and will benefit from skilled PT services in order to improve gait, strength, and pain in order to improve function at home and with leisure activities such as hiking.  OBJECTIVE IMPAIRMENTS: Abnormal gait, decreased strength, and pain.   ACTIVITY LIMITATIONS: bending, sitting, squatting, and transfers  PARTICIPATION LIMITATIONS: cleaning, driving, shopping, and community activity  PERSONAL FACTORS: 3+ comorbidities: CLL, OSA, OA, and neuropathy  are also affecting patient's functional outcome.   REHAB POTENTIAL: Excellent  CLINICAL DECISION MAKING: Evolving/moderate complexity  EVALUATION COMPLEXITY: Moderate   GOALS: Goals reviewed with patient? No  SHORT TERM GOALS: Target date: 08/13/2022   Pt will be independent with HEP in order to improve strength and decrease hip pain to improve pain-free function at home. Baseline:  Goal status: INITIAL   LONG TERM GOALS: Target date: 09/24/2022   Pt will increase FOTO to at least 58 to demonstrate significant improvement in function at home and with leisure activities related to hip pain  Baseline: 07/02/22: 31; Goal status:  INITIAL  2.  Pt will decrease worst hip pain by at least 3 points on the NPRS in order to demonstrate clinically significant reduction in hip pain. Baseline: 07/01/21: worst: 7/10; Goal status: INITIAL  3.  Pt will increase HOOS Jr score by at least 18 points in order demonstrate clinically significant improvement in R hip pain and function      Baseline: 07/02/22: To be completed; 07/09/22: 11/28, Interval Score: 55.985/100 Goal status: INITIAL  4.  Pt will report at least 75% improvement in his hip symptoms in order to return to walking and hiking without notable increase in his pain.  Baseline:  Goal status: INITIAL   PLAN: PT FREQUENCY: 1-2x/week  PT DURATION: 12 weeks  PLANNED INTERVENTIONS: Therapeutic exercises, Therapeutic activity, Neuromuscular re-education, Balance training, Gait training, Patient/Family education, Self Care, Joint mobilization, Joint manipulation, Vestibular training, Canalith repositioning, Orthotic/Fit training, DME instructions, Dry Needling, Electrical stimulation, Spinal manipulation, Spinal mobilization, Cryotherapy, Moist heat, Taping, Traction, Ultrasound, Ionotophoresis 4mg /ml Dexamethasone, Manual therapy, and Re-evaluation.  PLAN FOR NEXT SESSION: review and modify HEP as necessary, progress strengthening and normalize gait;  Lyndel Safe Ailee Pates PT, DPT, GCS  Myria Steenbergen, PT 07/09/2022, 10:47 AM

## 2022-07-09 NOTE — Therapy (Signed)
OUTPATIENT PHYSICAL THERAPY HIP TREATMENT  Patient Name: Chad Gaines MRN: [id number] DOB:September 16, 1945, 77 y.o., male Today's Date: 07/11/2022  END OF SESSION:  PT End of Session - 07/11/22 0852     Visit Number 4    Number of Visits 25    Date for PT Re-Evaluation 09/24/22    Authorization Type Medicare / BCBS 2024  [id number] on [place]  no auth req    Authorization Time Period eval: 07/02/22    PT Start Time 0840    PT Stop Time 0930    PT Time Calculation (min) 50 min    Equipment Utilized During Treatment Gait belt    Activity Tolerance Patient tolerated treatment well    Behavior During Therapy WFL for tasks assessed/performed            Past Medical History:  Diagnosis Date   Allergy to alpha-gal    GERD (gastroesophageal reflux disease)    Hypertension    Pinched nerve    some numbness R arm, R ankle   Sleep apnea    CPAP   Past Surgical History:  Procedure Laterality Date   APPENDECTOMY     CATARACT EXTRACTION W/PHACO Left 07/03/2021   Procedure: CATARACT EXTRACTION PHACO AND INTRAOCULAR LENS PLACEMENT (IOC) LEFT VIVITY TORIC 3.59 00:27.8;  Surgeon: Nevada Crane, MD;  Location: [facility];  Service: Ophthalmology;  Laterality: Left;  sleep apnea   CATARACT EXTRACTION W/PHACO Right 07/17/2021   Procedure: CATARACT EXTRACTION PHACO AND INTRAOCULAR LENS PLACEMENT (IOC) RIGHT VIVITY TORIC 4.20 00:33.2;  Surgeon: Nevada Crane, MD;  Location: [facility];  Service: Ophthalmology;  Laterality: Right;  sleep apnea   NASAL SEPTUM SURGERY     Patient Active Problem List   Diagnosis Date Noted   Adult hypothyroidism 12/25/2014   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   Apnea, sleep 12/25/2014   Essential (primary) hypertension 08/05/2014   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HCC) 03/08/2014   Chronic lymphocytic leukemia (HCC) 03/08/2014   PCP: Bethann Punches MD  REFERRING PROVIDER: Currie Paris MD  REFERRING DIAG:  Diagnosis  M16.11  (ICD-10-CM) - Unilateral primary osteoarthritis, right hip   RATIONALE FOR EVALUATION AND TREATMENT: Rehabilitation  THERAPY DIAG: Pain in right hip  ONSET DATE: Multiple years  FOLLOW-UP APPT SCHEDULED WITH REFERRING PROVIDER: Yes   FROM INITIAL EVALUATION SUBJECTIVE:                                                                                                                                                                                         SUBJECTIVE STATEMENT:  R hip pain  PERTINENT HISTORY:  Pt underwent R posterior approach  THR on 06/12/22 without reported post-op complications. He was discharged on baby ASA for VTE and has been ambulating with a front wheeled walker since the surgery. He has tried using a rollator and notes that it is easier to use for walking however reports that it causes him to lean forward excessively. He has been off Tramadol for the last week and is using Tylenol to manage the pain. He reports that his sleep has been very disrupted since the surgery. He is also complaining of diarrhea since the surgery as well as mild DOE with activity. No falls reported. No chills, fevers, night sweats. No erythema, drainage, or LE swelling since the surgery. His post-op follow-up went well and staples/sutures were removed with instructions to follow-up in 4-6 weeks. Pt advised to wait at least 2 weeks for therapy after surgery and was given HEP by orthopedics which he is performing including quad sets, glut sets, ankle pumps, mini squats, and standing hip abduction.  History from PT evaluation 03/29/22: Pt reports R hip pain for multiple years with progressive worsening. Pain was initially in the anterior groin and posterior hip however the anterior hip pain has mostly resolved at this time with medication. He is now limping and walking with a hiking pole. Pain worsens throughout the day. Pt reports progressive RLE weakness however he has not had any falls. Posterior hip pain  has been nonresponsive to anti-inflammatories, oral steroids, gabapentin, and cortisone injections. Plain x-ray showed L5-S1 disc narrowing. Lumbar MRI ordered. "It has started to define my life." He used to like to walk and hike but hasn't been able to lately because of the pain. History of cervical pain and RUE radiculopathy.    PAIN:    Pain Intensity: Present: 0/10, Best: 0/10, Worst: 7/10 Pain location: Anterior and posterior R hip (mostly posterior) Pain Quality: sharp with transitions, occasional jabbing pain  Radiating: Yes, posterolateral RLE into the thigh but no longer down to the foot/ankle Numbness/Tingling: Yes, "prickly" in R thigh; Focal Weakness: Yes, RLE weakness since surgery related to pain; Aggravating factors: quick transitions, walking, in and out of bed; Relieving factors: History of prior back injury, pain, surgery, or therapy: No Dominant hand: right Imaging: Yes, see history Red flags: Positive for history of cancer (CLL), diarrhea since surgery, negative for bowel/bladder changes, saddle paresthesia, h/o spinal tumors, h/o compression fx, h/o abdominal aneurysm, chills/fever, night sweats, nausea, vomiting;  PRECAUTIONS: Posterior hip  WEIGHT BEARING RESTRICTIONS: No  FALLS: Has patient fallen in last 6 months? 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   Hobbies: Reading, walking, hiking, pastoral care;  Patient Goals: Pt would like to decrease his pain and be able to return to walking and hiking for leisure   OBJECTIVE: BP: 120/68 mmHg, HR: 68, SpO2: 98%;  Patient Surveys  FOTO 38, predicted improvement to 51 HOOS JR: to be  completed;  Cognition Patient is oriented to person, place, and time.  Recent memory is intact.  Remote memory is intact.  Attention span and concentration are intact.  Expressive speech is intact.  Patient's fund of knowledge is within normal limits for educational level.    Gross Musculoskeletal Assessment Tremor: None Bulk: Normal Tone:  Normal Incision is clean and dry and appears to be healing well. No erythema, edema, discharge, or ecchymosis noted around incision  GAIT: Antalgic gait on the RLE. Pt ambulates with combination of step-to and partial step through pattern with front wheeled walker. When practicing with rollator his gait is more normalized with step-through pattern.   Posture: Lumbar lordosis: WNL Iliac crest height: Equal bilaterally Lumbar lateral shift: Negative  AROM Deferred at this time. Time spent instructing pt in posterior hip precautions;  LE MMT: MMT (out of 5) Right  Left   Hip flexion 4* 5  Hip extension    Hip abduction (seated) 4+* 5  Hip adduction (seated) 4+* 5  Hip internal rotation (testing at neural) 5* 5  Hip external rotation 5* 5  Knee flexion (seated) 4+* 5  Knee extension 5 5  Ankle dorsiflexion 5 5  Ankle plantarflexion Strong Strong  Ankle inversion    Ankle eversion    (* = pain; Blank rows = not tested)  Sensation WNL bilaterally L5-S1. Proprioception, stereognosis, and hot/cold testing deferred on this date.  Functional Outcome Measures  Results Comments  BERG    DGI    FGA    TUG  28.3 seconds Increased fall risk, decreased function;  5TSTS     6 Minute Walk Test    10 Meter Gait Speed Self-selected: 17.5s = 0.57 m/s; Fastest: 9.4s = 1.06 m/s Self-selected gait speed below normative values for community ambulation;  (Blank rows = not tested)   TODAY'S TREATMENT   SUBJECTIVE: Pt reports that he is doing well today. He denies any resting pain upon arrival but still has significant pain during ambulation. He  notes a lot of stiffness when first standing to move but with activity the stiffness decreases.    PAIN: Denies resting pain, pain increases with walking;   Ther-ex  NuStep L2-3 x 10 minutes at start of session while obtaining interval history for warm-up and BLE strengthening with therapist monitoring fatigue and adjusting resistance accordingly (5 minutes unbilled); 6" forward step-ups leading with RLE up and LLE down x 10; Heel raises off step x 15; Stair ascend/descend leading up with RLE and down with LLE for strengthening 4 steps x 2 bouts; Mini squats x 15;  Seated LAQ with 2# ankle weights 2 x 10 BLE; Seated clams with manual resistance from therapist 2 x 10 BLE, not crossing midline; Seated adductor isometric ball squeezes 2 x 10 BLE, not crossing midline;  Standing hip strengthening with 2# ankle weights: Hip flexion marches 2 x 10 BLE; HS curls 2 x 10 BLE; Hip abduction 2 x 10 BLE; Hip extension 2 x 10 BLE;  Forward BOSU lunges (round side up) without UE support alternating LE x 10 with each LE, hip precautions maintained;   Not performed; Hooklying R SLR x 10 with gentle assist, maintaining hip precautions but increase in pain so regressed to hooklying marches; Hooklying alternating marches x 10 BLE; Hooklying R straight leg manually resisted hip abduction/adduction 2 x 10 each; Supine R heel slides with manually resisted extension 2 x 10; Hooklying bridges 2 x 10; Hooklying clams 2 x 10; Hooklying adduction squeeze with manual resistance 2 x 10, not crossing midline;   PATIENT EDUCATION:  Education details: Pt educated throughout session about proper posture and technique with exercises. Improved exercise technique, movement at target joints, use of target muscles after min to mod verbal, visual, tactile cues. Person educated: Patient Education method: Explanation, demonstration Education comprehension: verbalized understanding  and returned  demonstration;   HOME EXERCISE PROGRAM:  Access Code: [email or web address] URL: [email or web address] Date: 07/05/2022 Prepared by: Ria Comment  Exercises - Supine Bridge  - 1 x daily - 7 x weekly - 2 sets - 10 reps - 3s hold - Hooklying Straight Leg Raise  - 1 x daily - 7 x weekly - 2 sets - 10 reps - 3s hold - Mini Squat with Counter Support  - 1 x daily - 7 x weekly - 2 sets - 10 reps - Standing Knee Flexion with Counter Support (Mirrored)  - 1 x daily - 7 x weekly - 2 sets - 10 reps - Heel Raises with Counter Support  - 1 x daily - 7 x weekly - 2 sets - 10 reps  Patient Education - THA Posterior Precautions Handout   ASSESSMENT:  CLINICAL IMPRESSION: Pt demonstrates excellent motivation during session today. Progressed strengthening including additional standing exercises during session. No LOB or RLE buckling noted but he does report some occasional stiffness when first starting session and after breaks. No HEP updates at this time. Pt encouraged to follow-up as scheduled. Plan to progress strengthening at future visit. He is making excellent progress since surgery and will benefit from skilled PT services in order to improve gait, strength, and pain in order to improve function at home and with leisure activities such as hiking.  OBJECTIVE IMPAIRMENTS: Abnormal gait, decreased strength, and pain.   ACTIVITY LIMITATIONS: bending, sitting, squatting, and transfers  PARTICIPATION LIMITATIONS: cleaning, driving, shopping, and community activity  PERSONAL FACTORS: 3+ comorbidities: CLL, OSA, OA, and neuropathy  are also affecting patient's functional outcome.   REHAB POTENTIAL: Excellent  CLINICAL DECISION MAKING: Evolving/moderate complexity  EVALUATION COMPLEXITY: Moderate   GOALS: Goals reviewed with patient? No  SHORT TERM GOALS: Target date: 08/13/2022   Pt will be independent with HEP in order to improve strength and decrease hip pain to improve pain-free  function at home. Baseline:  Goal status: INITIAL   LONG TERM GOALS: Target date: 09/24/2022   Pt will increase FOTO to at least 58 to demonstrate significant improvement in function at home and with leisure activities related to hip pain  Baseline: 07/02/22: 31; Goal status: INITIAL  2.  Pt will decrease worst hip pain by at least 3 points on the NPRS in order to demonstrate clinically significant reduction in hip pain. Baseline: 07/01/21: worst: 7/10; Goal status: INITIAL  3.  Pt will increase HOOS Jr score by at least 18 points in order demonstrate clinically significant improvement in R hip pain and function      Baseline: 07/02/22: To be completed; 07/09/22: 11/28, Interval Score: 55.99/100 Goal status: INITIAL  4.  Pt will report at least 75% improvement in his hip symptoms in order to return to walking and hiking without notable increase in his pain.  Baseline:  Goal status: INITIAL   PLAN: PT FREQUENCY: 1-2x/week  PT DURATION: 12 weeks  PLANNED INTERVENTIONS: Therapeutic exercises, Therapeutic activity, Neuromuscular re-education, Balance training, Gait training, Patient/Family education, Self Care, Joint mobilization, Joint manipulation, Vestibular training, Canalith repositioning, Orthotic/Fit training, DME instructions, Dry Needling, Electrical stimulation, Spinal manipulation, Spinal mobilization, Cryotherapy, Moist heat, Taping, Traction, Ultrasound, Ionotophoresis 4mg /ml Dexamethasone, Manual therapy, and Re-evaluation.  PLAN FOR NEXT SESSION: review and modify HEP as necessary, progress strengthening and normalize gait;  Sharalyn Ink Jenniah Bhavsar PT, DPT, GCS  Ivis Henneman, PT 07/11/2022, 11:00 AM

## 2022-07-11 ENCOUNTER — Ambulatory Visit: Payer: Medicare Other

## 2022-07-11 DIAGNOSIS — M25551 Pain in right hip: Secondary | ICD-10-CM

## 2022-07-16 ENCOUNTER — Ambulatory Visit: Payer: Medicare Other | Attending: Orthopedic Surgery

## 2022-07-16 DIAGNOSIS — R262 Difficulty in walking, not elsewhere classified: Secondary | ICD-10-CM | POA: Insufficient documentation

## 2022-07-16 DIAGNOSIS — M25551 Pain in right hip: Secondary | ICD-10-CM | POA: Diagnosis present

## 2022-07-16 DIAGNOSIS — M6281 Muscle weakness (generalized): Secondary | ICD-10-CM | POA: Insufficient documentation

## 2022-07-16 NOTE — Therapy (Addendum)
OUTPATIENT PHYSICAL THERAPY HIP TREATMENT  Patient Name: Chad Gaines MRN: [id number] DOB:1946/03/27, 77 y.o., male Today's Date: 07/16/2022  END OF SESSION:  PT End of Session - 07/16/22 0850     Visit Number 5    Number of Visits 25    Date for PT Re-Evaluation 09/24/22    Authorization Type Medicare / BCBS 2024  [id number] on [place]  no auth req    Authorization Time Period eval: 07/02/22    PT Start Time 0845    PT Stop Time 0930    PT Time Calculation (min) 45 min    Equipment Utilized During Treatment Gait belt    Activity Tolerance Patient tolerated treatment well    Behavior During Therapy WFL for tasks assessed/performed            Past Medical History:  Diagnosis Date   Allergy to alpha-gal    GERD (gastroesophageal reflux disease)    Hypertension    Pinched nerve    some numbness R arm, R ankle   Sleep apnea    CPAP   Past Surgical History:  Procedure Laterality Date   APPENDECTOMY     CATARACT EXTRACTION W/PHACO Left 07/03/2021   Procedure: CATARACT EXTRACTION PHACO AND INTRAOCULAR LENS PLACEMENT (IOC) LEFT VIVITY TORIC 3.59 00:27.8;  Surgeon: Eulogio Bear, MD;  Location: [facility];  Service: Ophthalmology;  Laterality: Left;  sleep apnea   CATARACT EXTRACTION W/PHACO Right 07/17/2021   Procedure: CATARACT EXTRACTION PHACO AND INTRAOCULAR LENS PLACEMENT (IOC) RIGHT VIVITY TORIC 4.20 00:33.2;  Surgeon: Eulogio Bear, MD;  Location: Home;  Service: Ophthalmology;  Laterality: Right;  sleep apnea   NASAL SEPTUM SURGERY     Patient Active Problem List   Diagnosis Date Noted   Adult hypothyroidism 12/25/2014   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   Apnea, sleep 12/25/2014   Essential (primary) hypertension 08/05/2014   Chronic lymphatic leukemia 03/08/2014   Chronic lymphocytic leukemia 03/08/2014   PCP: Emily Filbert MD  REFERRING PROVIDER: Mardi Mainland MD  REFERRING DIAG:  Diagnosis  M16.11 (ICD-10-CM) -  Unilateral primary osteoarthritis, right hip   RATIONALE FOR EVALUATION AND TREATMENT: Rehabilitation  THERAPY DIAG: Pain in right hip  ONSET DATE: Multiple years  FOLLOW-UP APPT SCHEDULED WITH REFERRING PROVIDER: Yes   FROM INITIAL EVALUATION SUBJECTIVE:                                                                                                                                                                                         SUBJECTIVE STATEMENT:  R hip pain  PERTINENT HISTORY:  Pt underwent R posterior approach THR on  06/12/22 without reported post-op complications. He was discharged on baby ASA for VTE and has been ambulating with a front wheeled walker since the surgery. He has tried using a rollator and notes that it is easier to use for walking however reports that it causes him to lean forward excessively. He has been off Tramadol for the last week and is using Tylenol to manage the pain. He reports that his sleep has been very disrupted since the surgery. He is also complaining of diarrhea since the surgery as well as mild DOE with activity. No falls reported. No chills, fevers, night sweats. No erythema, drainage, or LE swelling since the surgery. His post-op follow-up went well and staples/sutures were removed with instructions to follow-up in 4-6 weeks. Pt advised to wait at least 2 weeks for therapy after surgery and was given HEP by orthopedics which he is performing including quad sets, glut sets, ankle pumps, mini squats, and standing hip abduction.  History from PT evaluation 03/29/22: Pt reports R hip pain for multiple years with progressive worsening. Pain was initially in the anterior groin and posterior hip however the anterior hip pain has mostly resolved at this time with medication. He is now limping and walking with a hiking pole. Pain worsens throughout the day. Pt reports progressive RLE weakness however he has not had any falls. Posterior hip pain has been  nonresponsive to anti-inflammatories, oral steroids, gabapentin, and cortisone injections. Plain x-ray showed L5-S1 disc narrowing. Lumbar MRI ordered. "It has started to define my life." He used to like to walk and hike but hasn't been able to lately because of the pain. History of cervical pain and RUE radiculopathy.    PAIN:    Pain Intensity: Present: 0/10, Best: 0/10, Worst: 7/10 Pain location: Anterior and posterior R hip (mostly posterior) Pain Quality: sharp with transitions, occasional jabbing pain  Radiating: Yes, posterolateral RLE into the thigh but no longer down to the foot/ankle Numbness/Tingling: Yes, "prickly" in R thigh; Focal Weakness: Yes, RLE weakness since surgery related to pain; Aggravating factors: quick transitions, walking, in and out of bed; Relieving factors: History of prior back injury, pain, surgery, or therapy: No Dominant hand: right Imaging: Yes, see history Red flags: Positive for history of cancer (CLL), diarrhea since surgery, negative for bowel/bladder changes, saddle paresthesia, h/o spinal tumors, h/o compression fx, h/o abdominal aneurysm, chills/fever, night sweats, nausea, vomiting;  PRECAUTIONS: Posterior hip  WEIGHT BEARING RESTRICTIONS: No  FALLS: Has patient fallen in last 6 months? 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   Hobbies: Reading, walking, hiking, pastoral care;  Patient Goals: Pt would like to decrease his pain and be able to return to walking and hiking for leisure   OBJECTIVE: BP: 120/68 mmHg, HR: 68, SpO2: 98%;  Patient Surveys  FOTO 38, predicted improvement to 66 HOOS JR: to be  completed;  Cognition Patient is oriented to person, place, and time.  Recent memory is intact.  Remote memory is intact.  Attention span and concentration are intact.  Expressive speech is intact.  Patient's fund of knowledge is within normal limits for educational level.    Gross Musculoskeletal Assessment Tremor: None Bulk: Normal Tone: Normal Incision  is clean and dry and appears to be healing well. No erythema, edema, discharge, or ecchymosis noted around incision  GAIT: Antalgic gait on the RLE. Pt ambulates with combination of step-to and partial step through pattern with front wheeled walker. When practicing with rollator his gait is more normalized with step-through pattern.   Posture: Lumbar lordosis: WNL Iliac crest height: Equal bilaterally Lumbar lateral shift: Negative  AROM Deferred at this time. Time spent instructing pt in posterior hip precautions;  LE MMT: MMT (out of 5) Right  Left   Hip flexion 4* 5  Hip extension    Hip abduction (seated) 4+* 5  Hip adduction (seated) 4+* 5  Hip internal rotation (testing at neural) 5* 5  Hip external rotation 5* 5  Knee flexion (seated) 4+* 5  Knee extension 5 5  Ankle dorsiflexion 5 5  Ankle plantarflexion Strong Strong  Ankle inversion    Ankle eversion    (* = pain; Blank rows = not tested)  Sensation WNL bilaterally L5-S1. Proprioception, stereognosis, and hot/cold testing deferred on this date.  Functional Outcome Measures  Results Comments  BERG    DGI    FGA    TUG  28.3 seconds Increased fall risk, decreased function;  5TSTS     6 Minute Walk Test    10 Meter Gait Speed Self-selected: 17.5s = 0.57 m/s; Fastest: 9.4s = 1.06 m/s Self-selected gait speed below normative values for community ambulation;  (Blank rows = not tested)   TODAY'S TREATMENT   SUBJECTIVE: Pt reports that he is doing well today. He denies any resting pain upon arrival but still has significant pain during ambulation. He  notes a lot of stiffness when first standing to move but with activity the stiffness decreases.    PAIN: Denies resting pain, pain increases with walking;   Ther-ex  NuStep L2-5 x 10 minutes BLE only at start of session while obtaining interval history for warm-up and BLE strengthening with therapist monitoring fatigue and adjusting resistance accordingly;  Mini squats x 15; Standing heel raises x 15;  Seated LAQ with 3# ankle weights x 15 BLE; Seated clams with manual resistance from therapist 2 x 10 BLE, not crossing midline; Seated adductor squeezes with manual resistance from therapist 2 x 10 BLE, not crossing midline;  Standing hip strengthening with 3# ankle weights: Hip flexion marches x 15 BLE; HS curls x 15 BLE; Hip abduction x 15 BLE; Hip extension x 15 BLE;  R single leg balance 2 x 30s; HEP updated and reviewed with patient;   Not performed; Hooklying R SLR x 10 with gentle assist, maintaining hip precautions but increase in pain so regressed to hooklying marches; Hooklying alternating marches x 10 BLE; Hooklying R straight leg manually resisted hip abduction/adduction 2 x 10 each; Supine R heel slides with manually resisted extension 2 x 10; Hooklying bridges 2 x 10; Hooklying clams 2 x 10; Hooklying adduction squeeze with manual resistance 2 x 10, not crossing midline; Forward BOSU lunges (round side up) without UE support alternating LE x 10 with each LE, hip precautions maintained; 6" forward step-ups leading with RLE up and LLE down x 10; Stair ascend/descend leading up with RLE and down with LLE for strengthening 4 steps x 2 bouts;   PATIENT EDUCATION:  Education details: Pt educated throughout session about proper posture and technique with exercises. Improved exercise technique, movement at target joints, use of target muscles after min to mod verbal, visual, tactile cues. Updated HEP Person  educated: Patient Education method: Explanation, demonstration,  and handout Education comprehension: verbalized understanding and returned demonstration;   HOME EXERCISE PROGRAM:  Access Code: [email or web address] URL: [email or web address] Date: 07/16/2022 Prepared by: Roxana Hires  Exercises - Hooklying Straight Leg Raise  - 1 x daily - 7 x weekly - 2 sets - 10 reps - 3s hold - Mini Squat with Counter Support  - 1 x daily - 7 x weekly - 2 sets - 10 reps - Standing Knee Flexion with Counter Support (Mirrored)  - 1 x daily - 7 x weekly - 2 sets - 10 reps - Standing Hip Abduction with Counter Support (Mirrored)  - 1 x daily - 7 x weekly - 2 sets - 10 reps - Heel Raises with Counter Support  - 1 x daily - 7 x weekly - 2 sets - 10 reps - Standing Hip Extension with Counter Support (Mirrored)  - 1 x daily - 7 x weekly - 2 sets - 10 reps - Single Leg Stance (Mirrored)  - 1 x daily - 7 x weekly - 3 reps - 30s hold  Patient Education - THA Posterior Precautions Handout    ASSESSMENT:  CLINICAL IMPRESSION: Pt demonstrates excellent motivation during session today. Progressed strengthening today and introduced RLE single leg stability training. Pt continues to report stiffness in R hip which gradually improves with activity. Updated HEP with patient during session today and plan of care updated to decreased frequency of 1x/wk with focus on independent exercise. Pt encouraged to follow-up as scheduled. Plan to progress strengthening at future visit. He is making excellent progress since surgery and will benefit from skilled PT services in order to improve gait, strength, and pain in order to improve function at home and with leisure activities such as hiking.  OBJECTIVE IMPAIRMENTS: Abnormal gait, decreased strength, and pain.   ACTIVITY LIMITATIONS: bending, sitting, squatting, and transfers  PARTICIPATION LIMITATIONS: cleaning, driving, shopping, and community activity  PERSONAL FACTORS: 3+ comorbidities: CLL, OSA, OA, and neuropathy  are also  affecting patient's functional outcome.   REHAB POTENTIAL: Excellent  CLINICAL DECISION MAKING: Evolving/moderate complexity  EVALUATION COMPLEXITY: Moderate   GOALS: Goals reviewed with patient? No  SHORT TERM GOALS: Target date: 08/13/2022   Pt will be independent with HEP in order to improve strength and decrease hip pain to improve pain-free function at home. Baseline:  Goal status: INITIAL   LONG TERM GOALS: Target date: 09/24/2022   Pt will increase FOTO to at least 58 to demonstrate significant improvement in function at home and with leisure activities related to hip pain  Baseline: 07/02/22: 31; Goal status: INITIAL  2.  Pt will decrease worst hip pain by at least 3 points on the NPRS in order to demonstrate clinically significant reduction in hip pain. Baseline: 07/01/21: worst: 7/10; Goal status: INITIAL  3.  Pt will increase HOOS Jr score by at least 18 points in order demonstrate clinically significant improvement in R hip pain and function      Baseline: 07/02/22: To be completed; 07/09/22: 11/28, Interval Score: 55.99/100 Goal status: INITIAL  4.  Pt will report at least 75% improvement in his hip symptoms in order to return to walking and hiking without notable increase in his pain.  Baseline:  Goal status: INITIAL   PLAN: PT FREQUENCY: 1-2x/week  PT DURATION: 12 weeks  PLANNED INTERVENTIONS: Therapeutic exercises, Therapeutic activity, Neuromuscular re-education, Balance training, Gait training, Patient/Family education, Self Care, Joint mobilization, Joint manipulation, Vestibular training, Canalith repositioning, Orthotic/Fit training, DME  instructions, Dry Needling, Electrical stimulation, Spinal manipulation, Spinal mobilization, Cryotherapy, Moist heat, Taping, Traction, Ultrasound, Ionotophoresis 4mg /ml Dexamethasone, Manual therapy, and Re-evaluation.  PLAN FOR NEXT SESSION: review and modify HEP as necessary, progress strengthening and normalize  gait;  Lyndel Safe Zaina Jenkin PT, DPT, GCS  Williette Loewe, PT 07/16/2022, 10:44 AM

## 2022-07-18 DIAGNOSIS — R262 Difficulty in walking, not elsewhere classified: Secondary | ICD-10-CM | POA: Insufficient documentation

## 2022-07-18 NOTE — Therapy (Signed)
OUTPATIENT PHYSICAL THERAPY HIP TREATMENT  Patient Name: Chad Gaines MRN: [id number] DOB:[birth year], 77 y.o., male Today's Date: 07/20/2022  END OF SESSION:  PT End of Session - 07/19/22 0801     Visit Number 6    Number of Visits 25    Date for PT Re-Evaluation 09/24/22    Authorization Type Medicare / BCBS 2024  [place]:QHUTM [place]  no auth req    Authorization Time Period eval: 07/02/22    PT Start Time 0801    PT Stop Time 0848    PT Time Calculation (min) 47 min    Equipment Utilized During Treatment Gait belt    Activity Tolerance Patient tolerated treatment well    Behavior During Therapy WFL for tasks assessed/performed            Past Medical History:  Diagnosis Date   Allergy to alpha-gal    GERD (gastroesophageal reflux disease)    Hypertension    Pinched nerve    some numbness R arm, R ankle   Sleep apnea    CPAP   Past Surgical History:  Procedure Laterality Date   APPENDECTOMY     CATARACT EXTRACTION W/PHACO Left 07/03/2021   Procedure: CATARACT EXTRACTION PHACO AND INTRAOCULAR LENS PLACEMENT (IOC) LEFT VIVITY TORIC 3.59 00:27.8;  Surgeon: Nevada Crane, MD;  Location: [facility];  Service: Ophthalmology;  Laterality: Left;  sleep apnea   CATARACT EXTRACTION W/PHACO Right 07/17/2021   Procedure: CATARACT EXTRACTION PHACO AND INTRAOCULAR LENS PLACEMENT (IOC) RIGHT VIVITY TORIC 4.20 00:33.2;  Surgeon: Nevada Crane, MD;  Location: [facility];  Service: Ophthalmology;  Laterality: Right;  sleep apnea   NASAL SEPTUM SURGERY     Patient Active Problem List   Diagnosis Date Noted   Difficulty in walking, not elsewhere classified 07/18/2022   Adult hypothyroidism 12/25/2014   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   Apnea, sleep 12/25/2014   Essential (primary) hypertension 08/05/2014   Chronic lymphatic leukemia 03/08/2014   Chronic lymphocytic leukemia 03/08/2014   PCP: Bethann Punches MD  REFERRING PROVIDER: Currie Paris  MD  REFERRING DIAG:  Diagnosis  M16.11 (ICD-10-CM) - Unilateral primary osteoarthritis, right hip   RATIONALE FOR EVALUATION AND TREATMENT: Rehabilitation  THERAPY DIAG: Pain in right hip  Muscle weakness (generalized)  Difficulty in walking, not elsewhere classified  ONSET DATE: Multiple years  FOLLOW-UP APPT SCHEDULED WITH REFERRING PROVIDER: Yes   FROM INITIAL EVALUATION SUBJECTIVE:  SUBJECTIVE STATEMENT:  R hip pain  PERTINENT HISTORY:  Pt underwent R posterior approach THR on 06/12/22 without reported post-op complications. He was discharged on baby ASA for VTE and has been ambulating with a front wheeled walker since the surgery. He has tried using a rollator and notes that it is easier to use for walking however reports that it causes him to lean forward excessively. He has been off Tramadol for the last week and is using Tylenol to manage the pain. He reports that his sleep has been very disrupted since the surgery. He is also complaining of diarrhea since the surgery as well as mild DOE with activity. No falls reported. No chills, fevers, night sweats. No erythema, drainage, or LE swelling since the surgery. His post-op follow-up went well and staples/sutures were removed with instructions to follow-up in 4-6 weeks. Pt advised to wait at least 2 weeks for therapy after surgery and was given HEP by orthopedics which he is performing including quad sets, glut sets, ankle pumps, mini squats, and standing hip abduction.  History from PT evaluation 03/29/22: Pt reports R hip pain for multiple years with progressive worsening. Pain was initially in the anterior groin and posterior hip however the anterior hip pain has mostly resolved at this time with medication. He is now limping and walking with a hiking  pole. Pain worsens throughout the day. Pt reports progressive RLE weakness however he has not had any falls. Posterior hip pain has been nonresponsive to anti-inflammatories, oral steroids, gabapentin, and cortisone injections. Plain x-ray showed L5-S1 disc narrowing. Lumbar MRI ordered. "It has started to define my life." He used to like to walk and hike but hasn't been able to lately because of the pain. History of cervical pain and RUE radiculopathy.    PAIN:    Pain Intensity: Present: 0/10, Best: 0/10, Worst: 7/10 Pain location: Anterior and posterior R hip (mostly posterior) Pain Quality: sharp with transitions, occasional jabbing pain  Radiating: Yes, posterolateral RLE into the thigh but no longer down to the foot/ankle Numbness/Tingling: Yes, "prickly" in R thigh; Focal Weakness: Yes, RLE weakness since surgery related to pain; Aggravating factors: quick transitions, walking, in and out of bed; Relieving factors: History of prior back injury, pain, surgery, or therapy: No Dominant hand: right Imaging: Yes, see history Red flags: Positive for history of cancer (CLL), diarrhea since surgery, negative for bowel/bladder changes, saddle paresthesia, h/o spinal tumors, h/o compression fx, h/o abdominal aneurysm, chills/fever, night sweats, nausea, vomiting;  PRECAUTIONS: Posterior hip  WEIGHT BEARING RESTRICTIONS: No  FALLS: Has patient fallen in last 6 months? 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   Hobbies: Reading, walking, hiking, pastoral care;  Patient Goals: Pt would like to decrease his pain and be able to return to walking and hiking for  leisure   OBJECTIVE: BP: 120/68 mmHg, HR: 68, SpO2: 98%;  Patient Surveys  FOTO 38, predicted improvement to 23 HOOS JR: to be completed;  Cognition Patient is oriented to person, place, and time.  Recent memory is intact.  Remote memory is intact.  Attention span and concentration are intact.  Expressive speech is intact.  Patient's fund of knowledge is within normal  limits for educational level.    Gross Musculoskeletal Assessment Tremor: None Bulk: Normal Tone: Normal Incision is clean and dry and appears to be healing well. No erythema, edema, discharge, or ecchymosis noted around incision  GAIT: Antalgic gait on the RLE. Pt ambulates with combination of step-to and partial step through pattern with front wheeled walker. When practicing with rollator his gait is more normalized with step-through pattern.   Posture: Lumbar lordosis: WNL Iliac crest height: Equal bilaterally Lumbar lateral shift: Negative  AROM Deferred at this time. Time spent instructing pt in posterior hip precautions;  LE MMT: MMT (out of 5) Right  Left   Hip flexion 4* 5  Hip extension    Hip abduction (seated) 4+* 5  Hip adduction (seated) 4+* 5  Hip internal rotation (testing at neural) 5* 5  Hip external rotation 5* 5  Knee flexion (seated) 4+* 5  Knee extension 5 5  Ankle dorsiflexion 5 5  Ankle plantarflexion Strong Strong  Ankle inversion    Ankle eversion    (* = pain; Blank rows = not tested)  Sensation WNL bilaterally L5-S1. Proprioception, stereognosis, and hot/cold testing deferred on this date.  Functional Outcome Measures  Results Comments  BERG    DGI    FGA    TUG  28.3 seconds Increased fall risk, decreased function;  5TSTS     6 Minute Walk Test    10 Meter Gait Speed Self-selected: 17.5s = 0.57 m/s; Fastest: 9.4s = 1.06 m/s Self-selected gait speed below normative values for community ambulation;  (Blank rows = not tested)   TODAY'S  TREATMENT   SUBJECTIVE: Pt reports that he is doing well today. He denies any resting pain upon arrival but still notes stiffness after sitting. He feels like his gait is improving. No specific questions or concerns currently.   PAIN: Denies resting pain, pain increases with walking;   Ther-ex  NuStep L2-5 x 10 minutes BLE only at start of session while obtaining interval history for warm-up and BLE strengthening with therapist monitoring fatigue and adjusting resistance accordingly; Nautilus resisted gait forward, backward, R lateral, and L lateral 60# x 3 each direction; 6" forward step-ups leading with RLE up and LLE down x 10; 6" L lateral heel taps to 2" airex pad 2 x 10; Stair ascend/descend progressing to reciprocal pattern 4 steps x 3 bouts; Mini squats x 15; Seated LAQ with 4# ankle weights x 15 BLE;  Standing hip strengthening with 4# ankle weights: Hip flexion marches x 15 BLE; HS curls x 15 BLE; Hip abduction x 15 BLE; Hip extension x 15 BLE;   PATIENT EDUCATION:  Education details: Pt educated throughout session about proper posture and technique with exercises. Improved exercise technique, movement at target joints, use of target muscles after min to mod verbal, visual, tactile cues. Person educated: Patient Education method: Explanation, demonstration, and handout Education comprehension: verbalized understanding and returned demonstration;   HOME EXERCISE PROGRAM:  Access Code: [email or web address] URL: [email or web address] Date: 07/16/2022 Prepared by: Ria Comment  Exercises - Hooklying Straight Leg Raise  - 1 x daily - 7 x weekly - 2 sets - 10 reps - 3s hold - Mini Squat with Counter Support  - 1 x daily - 7 x weekly - 2 sets - 10 reps - Standing Knee Flexion with Counter Support (Mirrored)  - 1 x daily - 7 x weekly - 2 sets - 10 reps - Standing Hip Abduction with Counter Support (Mirrored)  - 1 x daily -  7 x weekly - 2 sets - 10 reps - Heel Raises  with Counter Support  - 1 x daily - 7 x weekly - 2 sets - 10 reps - Standing Hip Extension with Counter Support (Mirrored)  - 1 x daily - 7 x weekly - 2 sets - 10 reps - Single Leg Stance (Mirrored)  - 1 x daily - 7 x weekly - 3 reps - 30s hold  Patient Education - THA Posterior Precautions Handout    ASSESSMENT:  CLINICAL IMPRESSION: Pt demonstrates excellent motivation during session today. Progressed strengthening today. Gait appears much less antalgic today when entering clinic. No HEP updates on this date. Therapy frequency decreased to 1x/wk with focus on independent exercise. Pt encouraged to follow-up as scheduled. Plan to progress strengthening at future visit. He is making excellent progress since surgery and will benefit from skilled PT services in order to improve gait, strength, and pain in order to improve function at home and with leisure activities such as hiking.  OBJECTIVE IMPAIRMENTS: Abnormal gait, decreased strength, and pain.   ACTIVITY LIMITATIONS: bending, sitting, squatting, and transfers  PARTICIPATION LIMITATIONS: cleaning, driving, shopping, and community activity  PERSONAL FACTORS: 3+ comorbidities: CLL, OSA, OA, and neuropathy  are also affecting patient's functional outcome.   REHAB POTENTIAL: Excellent  CLINICAL DECISION MAKING: Evolving/moderate complexity  EVALUATION COMPLEXITY: Moderate   GOALS: Goals reviewed with patient? No  SHORT TERM GOALS: Target date: 08/13/2022   Pt will be independent with HEP in order to improve strength and decrease hip pain to improve pain-free function at home. Baseline:  Goal status: INITIAL   LONG TERM GOALS: Target date: 09/24/2022   Pt will increase FOTO to at least 58 to demonstrate significant improvement in function at home and with leisure activities related to hip pain  Baseline: 07/02/22: 31; Goal status: INITIAL  2.  Pt will decrease worst hip pain by at least 3 points on the NPRS in order to  demonstrate clinically significant reduction in hip pain. Baseline: 07/01/21: worst: 7/10; Goal status: INITIAL  3.  Pt will increase HOOS Jr score by at least 18 points in order demonstrate clinically significant improvement in R hip pain and function      Baseline: 07/02/22: To be completed; 07/09/22: 11/28, Interval Score: 55.99/100 Goal status: INITIAL  4.  Pt will report at least 75% improvement in his hip symptoms in order to return to walking and hiking without notable increase in his pain.  Baseline:  Goal status: INITIAL   PLAN: PT FREQUENCY: 1-2x/week  PT DURATION: 12 weeks  PLANNED INTERVENTIONS: Therapeutic exercises, Therapeutic activity, Neuromuscular re-education, Balance training, Gait training, Patient/Family education, Self Care, Joint mobilization, Joint manipulation, Vestibular training, Canalith repositioning, Orthotic/Fit training, DME instructions, Dry Needling, Electrical stimulation, Spinal manipulation, Spinal mobilization, Cryotherapy, Moist heat, Taping, Traction, Ultrasound, Ionotophoresis 4mg /ml Dexamethasone, Manual therapy, and Re-evaluation.  PLAN FOR NEXT SESSION: review and modify HEP as necessary, progress strengthening and normalize gait;  Chad Gaines PT, DPT, GCS  Chad Gaines, PT 07/20/2022, 1:04 PM

## 2022-07-19 ENCOUNTER — Ambulatory Visit: Payer: Medicare Other

## 2022-07-19 DIAGNOSIS — M25551 Pain in right hip: Secondary | ICD-10-CM

## 2022-07-19 DIAGNOSIS — M6281 Muscle weakness (generalized): Secondary | ICD-10-CM

## 2022-07-19 DIAGNOSIS — R262 Difficulty in walking, not elsewhere classified: Secondary | ICD-10-CM

## 2022-07-23 NOTE — Therapy (Signed)
OUTPATIENT PHYSICAL THERAPY HIP TREATMENT  Patient Name: Chad Gaines MRN: [id number] DOB:1946/03/22, 77 y.o., male Today's Date: 07/24/2022  END OF SESSION:  PT End of Session - 07/24/22 0758     Visit Number 7    Number of Visits 25    Date for PT Re-Evaluation 09/24/22    Authorization Type Medicare / BCBS 2024  KM:QKMMN on [place]  no auth req    Authorization Time Period eval: 07/02/22    PT Start Time 0800    PT Stop Time 0845    PT Time Calculation (min) 45 min    Activity Tolerance Patient tolerated treatment well    Behavior During Therapy WFL for tasks assessed/performed            Past Medical History:  Diagnosis Date   Allergy to alpha-gal    GERD (gastroesophageal reflux disease)    Hypertension    Pinched nerve    some numbness R arm, R ankle   Sleep apnea    CPAP   Past Surgical History:  Procedure Laterality Date   APPENDECTOMY     CATARACT EXTRACTION W/PHACO Left 07/03/2021   Procedure: CATARACT EXTRACTION PHACO AND INTRAOCULAR LENS PLACEMENT (IOC) LEFT VIVITY TORIC 3.59 00:27.8;  Surgeon: Nevada Crane, MD;  Location: [facility];  Service: Ophthalmology;  Laterality: Left;  sleep apnea   CATARACT EXTRACTION W/PHACO Right 07/17/2021   Procedure: CATARACT EXTRACTION PHACO AND INTRAOCULAR LENS PLACEMENT (IOC) RIGHT VIVITY TORIC 4.20 00:33.2;  Surgeon: Nevada Crane, MD;  Location: [facility];  Service: Ophthalmology;  Laterality: Right;  sleep apnea   NASAL SEPTUM SURGERY     Patient Active Problem List   Diagnosis Date Noted   Difficulty in walking, not elsewhere classified 07/18/2022   Adult hypothyroidism 12/25/2014   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   Apnea, sleep 12/25/2014   Essential (primary) hypertension 08/05/2014   Chronic lymphatic leukemia 03/08/2014   Chronic lymphocytic leukemia 03/08/2014   PCP: Bethann Punches MD  REFERRING PROVIDER: Currie Paris MD  REFERRING DIAG:  Diagnosis  M16.11  (ICD-10-CM) - Unilateral primary osteoarthritis, right hip   RATIONALE FOR EVALUATION AND TREATMENT: Rehabilitation  THERAPY DIAG: Pain in right hip  Muscle weakness (generalized)  Difficulty in walking, not elsewhere classified  ONSET DATE: Multiple years  FOLLOW-UP APPT SCHEDULED WITH REFERRING PROVIDER: Yes   FROM INITIAL EVALUATION SUBJECTIVE:                                                                                                                                                                                         SUBJECTIVE STATEMENT:  R hip pain  PERTINENT HISTORY:  Pt underwent R posterior approach THR on 06/12/22 without reported post-op complications. He was discharged on baby ASA for VTE and has been ambulating with a front wheeled walker since the surgery. He has tried using a rollator and notes that it is easier to use for walking however reports that it causes him to lean forward excessively. He has been off Tramadol for the last week and is using Tylenol to manage the pain. He reports that his sleep has been very disrupted since the surgery. He is also complaining of diarrhea since the surgery as well as mild DOE with activity. No falls reported. No chills, fevers, night sweats. No erythema, drainage, or LE swelling since the surgery. His post-op follow-up went well and staples/sutures were removed with instructions to follow-up in 4-6 weeks. Pt advised to wait at least 2 weeks for therapy after surgery and was given HEP by orthopedics which he is performing including quad sets, glut sets, ankle pumps, mini squats, and standing hip abduction.  History from PT evaluation 03/29/22: Pt reports R hip pain for multiple years with progressive worsening. Pain was initially in the anterior groin and posterior hip however the anterior hip pain has mostly resolved at this time with medication. He is now limping and walking with a hiking pole. Pain worsens throughout the day. Pt  reports progressive RLE weakness however he has not had any falls. Posterior hip pain has been nonresponsive to anti-inflammatories, oral steroids, gabapentin, and cortisone injections. Plain x-ray showed L5-S1 disc narrowing. Lumbar MRI ordered. "It has started to define my life." He used to like to walk and hike but hasn't been able to lately because of the pain. History of cervical pain and RUE radiculopathy.    PAIN:    Pain Intensity: Present: 0/10, Best: 0/10, Worst: 7/10 Pain location: Anterior and posterior R hip (mostly posterior) Pain Quality: sharp with transitions, occasional jabbing pain  Radiating: Yes, posterolateral RLE into the thigh but no longer down to the foot/ankle Numbness/Tingling: Yes, "prickly" in R thigh; Focal Weakness: Yes, RLE weakness since surgery related to pain; Aggravating factors: quick transitions, walking, in and out of bed; Relieving factors: History of prior back injury, pain, surgery, or therapy: No Dominant hand: right Imaging: Yes, see history Red flags: Positive for history of cancer (CLL), diarrhea since surgery, negative for bowel/bladder changes, saddle paresthesia, h/o spinal tumors, h/o compression fx, h/o abdominal aneurysm, chills/fever, night sweats, nausea, vomiting;  PRECAUTIONS: Posterior hip  WEIGHT BEARING RESTRICTIONS: No  FALLS: Has patient fallen in last 6 months? 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   Hobbies: Reading, walking, hiking, pastoral care;  Patient Goals: Pt would like to decrease his pain and be able to return to walking and hiking for leisure   OBJECTIVE: BP: 120/68 mmHg, HR: 68, SpO2:  98%;  Patient Surveys  FOTO 38, predicted improvement to 34 HOOS JR: to be completed;  Cognition Patient is oriented to person, place, and time.  Recent memory is intact.  Remote memory is intact.  Attention span and concentration are intact.  Expressive speech is intact.  Patient's fund of knowledge is within normal limits for educational level.  Gross Musculoskeletal Assessment Tremor: None Bulk: Normal Tone: Normal Incision is clean and dry and appears to be healing well. No erythema, edema, discharge, or ecchymosis noted around incision  GAIT: Antalgic gait on the RLE. Pt ambulates with combination of step-to and partial step through pattern with front wheeled walker. When practicing with rollator his gait is more normalized with step-through pattern.   Posture: Lumbar lordosis: WNL Iliac crest height: Equal bilaterally Lumbar lateral shift: Negative  AROM Deferred at this time. Time spent instructing pt in posterior hip precautions;  LE MMT: MMT (out of 5) Right  Left   Hip flexion 4* 5  Hip extension    Hip abduction (seated) 4+* 5  Hip adduction (seated) 4+* 5  Hip internal rotation (testing at neural) 5* 5  Hip external rotation 5* 5  Knee flexion (seated) 4+* 5  Knee extension 5 5  Ankle dorsiflexion 5 5  Ankle plantarflexion Strong Strong  Ankle inversion    Ankle eversion    (* = pain; Blank rows = not tested)  Sensation WNL bilaterally L5-S1. Proprioception, stereognosis, and hot/cold testing deferred on this date.  Functional Outcome Measures  Results Comments  BERG    DGI    FGA    TUG  28.3 seconds Increased fall risk, decreased function;  5TSTS     6 Minute Walk Test    10 Meter Gait Speed Self-selected: 17.5s = 0.57 m/s; Fastest: 9.4s = 1.06 m/s Self-selected gait speed below normative values for community ambulation;  (Blank rows = not tested)   TODAY'S TREATMENT   SUBJECTIVE: Pt reports that he is doing well today. He denies any  resting pain upon arrival. Persistent stiffness after sitting. He feels like his gait is improving and has been able to walk while holding his cane or only placing on the ground every few steps. No specific questions or concerns currently.   PAIN: Denies resting pain, pain increases with walking;   Ther-ex  NuStep L1 warm-up x 2 minutes BLE only followed by intervals L1/L6-8, 30s on/60s off for an additional 8 minutes followed by a 1 minute cool down (11 minutes total) for BLE strengthening with therapist monitoring fatigue and adjusting resistance accordingly;  Stair ascend/descend, reciprocal pattern 4 steps x 4 bouts; 6" forward step-ups leading with RLE up and LLE down x 10; 6" L lateral heel taps to 2" airex pad x 10; 6" L lateral heel taps to floor x 10; Total Gym (TG) Level 22 (L22) double leg squats 2 x 10; TG L22 R single leg squats 2 x 10; TG L22 double leg heel raises 2 x 10; Supine R SLR x 10; Hooklying clams with manual resistance x 10; Hooklying adductor squeeze with manual resistance x 10; Hooklying bridges x 10; Hooklying bridge marches x 5 on each side; HEP review and additional resistance bands provided to patient;   Not performed: Nautilus resisted gait forward, backward, R lateral, and L lateral 60# x 3 each direction; Standing hip strengthening with 4# ankle weights: Hip flexion marches x 15 BLE; HS curls x 15 BLE; Hip abduction x 15 BLE; Hip extension x 15 BLE; Mini squats x 15; Seated LAQ with 4# ankle weights x 15 BLE;   PATIENT EDUCATION:  Education details: Pt educated throughout session about proper posture and technique with exercises. Improved exercise technique, movement at target joints, use of target muscles after min to mod verbal, visual, tactile cues. Person educated: Patient Education method: Explanation, demonstration, and handout Education comprehension:  verbalized understanding and returned demonstration;   HOME EXERCISE PROGRAM:   Access Code: [email or web address] URL: [email or web address] Date: 07/24/2022 Prepared by: Ria Comment  Exercises - Mini Squat with Counter Support  - 1 x daily - 7 x weekly - 2 sets - 10 reps - Standing Knee Flexion with Counter Support (Mirrored)  - 1 x daily - 7 x weekly - 2 sets - 10 reps - Standing Hip Abduction with Counter Support (Mirrored)  - 1 x daily - 7 x weekly - 2 sets - 10 reps - Heel Raises with Counter Support  - 1 x daily - 7 x weekly - 2 sets - 10 reps - Standing Hip Extension with Counter Support (Mirrored)  - 1 x daily - 7 x weekly - 2 sets - 10 reps - Single Leg Stance (Mirrored)  - 1 x daily - 7 x weekly - 3 reps - 30s hold  Patient Education - THA Posterior Precautions Handout  Green, blue, and gray tbands;    ASSESSMENT:  CLINICAL IMPRESSION: Pt demonstrates excellent motivation during session today. Progressed strengthening and overall his R hip strength appears significantly improved compared to his initial evaluation. His gait is improving and he is depending on his cane less during ambulation. Therapy frequency will be 1x/wk moving forward with focus on independent exercise. Pt encouraged to follow-up as scheduled. Plan to progress strengthening at future visit. He is making excellent progress since surgery and will benefit from skilled PT services in order to improve gait, strength, and pain in order to improve function at home and with leisure activities such as hiking.  OBJECTIVE IMPAIRMENTS: Abnormal gait, decreased strength, and pain.   ACTIVITY LIMITATIONS: bending, sitting, squatting, and transfers  PARTICIPATION LIMITATIONS: cleaning, driving, shopping, and community activity  PERSONAL FACTORS: 3+ comorbidities: CLL, OSA, OA, and neuropathy  are also affecting patient's functional outcome.   REHAB POTENTIAL: Excellent  CLINICAL DECISION MAKING: Evolving/moderate complexity  EVALUATION COMPLEXITY: Moderate   GOALS: Goals reviewed with  patient? No  SHORT TERM GOALS: Target date: 08/13/2022   Pt will be independent with HEP in order to improve strength and decrease hip pain to improve pain-free function at home. Baseline:  Goal status: INITIAL   LONG TERM GOALS: Target date: 09/24/2022   Pt will increase FOTO to at least 58 to demonstrate significant improvement in function at home and with leisure activities related to hip pain  Baseline: 07/02/22: 31; Goal status: INITIAL  2.  Pt will decrease worst hip pain by at least 3 points on the NPRS in order to demonstrate clinically significant reduction in hip pain. Baseline: 07/01/21: worst: 7/10; Goal status: INITIAL  3.  Pt will increase HOOS Jr score by at least 18 points in order demonstrate clinically significant improvement in R hip pain and function      Baseline: 07/02/22: To be completed; 07/09/22: 11/28, Interval Score: 55.99/100 Goal status: INITIAL  4.  Pt will report at least 75% improvement in his hip symptoms in order to return to walking and hiking without notable increase in his pain.  Baseline:  Goal status: INITIAL   PLAN: PT FREQUENCY: 1-2x/week  PT DURATION: 12 weeks  PLANNED INTERVENTIONS: Therapeutic exercises, Therapeutic activity, Neuromuscular re-education, Balance training, Gait training, Patient/Family education, Self Care, Joint mobilization, Joint manipulation, Vestibular training, Canalith repositioning, Orthotic/Fit training, DME instructions, Dry Needling, Electrical stimulation, Spinal manipulation, Spinal mobilization, Cryotherapy, Moist heat, Taping, Traction, Ultrasound, Ionotophoresis 4mg /ml Dexamethasone, Manual therapy, and Re-evaluation.  PLAN FOR NEXT SESSION: review and modify  HEP as necessary, progress strengthening and normalize gait;  Sharalyn InkJason D Jabron Weese PT, DPT, GCS  Stanely Sexson, PT 07/24/2022, 12:34 PM

## 2022-07-24 ENCOUNTER — Ambulatory Visit: Payer: Medicare Other

## 2022-07-24 DIAGNOSIS — M25551 Pain in right hip: Secondary | ICD-10-CM | POA: Diagnosis not present

## 2022-07-24 DIAGNOSIS — R262 Difficulty in walking, not elsewhere classified: Secondary | ICD-10-CM

## 2022-07-24 DIAGNOSIS — M6281 Muscle weakness (generalized): Secondary | ICD-10-CM

## 2022-07-30 NOTE — Therapy (Signed)
OUTPATIENT PHYSICAL THERAPY HIP TREATMENT  Patient Name: Chad Gaines MRN: [id number] DOB:03/23/1946, 77 y.o., male Today's Date: 07/31/2022  END OF SESSION:  PT End of Session - 07/31/22 0854     Visit Number 8    Number of Visits 25    Date for PT Re-Evaluation 09/24/22    Authorization Type Medicare / BCBS 2024  [id number] on [place]  no auth req    Authorization Time Period eval: 07/02/22    PT Start Time 0847    PT Stop Time 0930    PT Time Calculation (min) 43 min    Activity Tolerance Patient tolerated treatment well    Behavior During Therapy WFL for tasks assessed/performed            Past Medical History:  Diagnosis Date   Allergy to alpha-gal    GERD (gastroesophageal reflux disease)    Hypertension    Pinched nerve    some numbness R arm, R ankle   Sleep apnea    CPAP   Past Surgical History:  Procedure Laterality Date   APPENDECTOMY     CATARACT EXTRACTION W/PHACO Left 07/03/2021   Procedure: CATARACT EXTRACTION PHACO AND INTRAOCULAR LENS PLACEMENT (IOC) LEFT VIVITY TORIC 3.59 00:27.8;  Surgeon: Nevada Crane, MD;  Location: [facility];  Service: Ophthalmology;  Laterality: Left;  sleep apnea   CATARACT EXTRACTION W/PHACO Right 07/17/2021   Procedure: CATARACT EXTRACTION PHACO AND INTRAOCULAR LENS PLACEMENT (IOC) RIGHT VIVITY TORIC 4.20 00:33.2;  Surgeon: Nevada Crane, MD;  Location: [facility];  Service: Ophthalmology;  Laterality: Right;  sleep apnea   NASAL SEPTUM SURGERY     Patient Active Problem List   Diagnosis Date Noted   Difficulty in walking, not elsewhere classified 07/18/2022   Adult hypothyroidism 12/25/2014   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   Apnea, sleep 12/25/2014   Essential (primary) hypertension 08/05/2014   Chronic lymphatic leukemia 03/08/2014   Chronic lymphocytic leukemia 03/08/2014   PCP: Bethann Punches MD  REFERRING PROVIDER: Currie Paris MD  REFERRING DIAG:  Diagnosis  M16.11  (ICD-10-CM) - Unilateral primary osteoarthritis, right hip   RATIONALE FOR EVALUATION AND TREATMENT: Rehabilitation  THERAPY DIAG: Pain in right hip  Muscle weakness (generalized)  Difficulty in walking, not elsewhere classified  ONSET DATE: Multiple years  FOLLOW-UP APPT SCHEDULED WITH REFERRING PROVIDER: Yes   FROM INITIAL EVALUATION SUBJECTIVE:                                                                                                                                                                                         SUBJECTIVE STATEMENT:  R hip pain  PERTINENT HISTORY:  Pt underwent R posterior approach THR on 06/12/22 without reported post-op complications. He was discharged on baby ASA for VTE and has been ambulating with a front wheeled walker since the surgery. He has tried using a rollator and notes that it is easier to use for walking however reports that it causes him to lean forward excessively. He has been off Tramadol for the last week and is using Tylenol to manage the pain. He reports that his sleep has been very disrupted since the surgery. He is also complaining of diarrhea since the surgery as well as mild DOE with activity. No falls reported. No chills, fevers, night sweats. No erythema, drainage, or LE swelling since the surgery. His post-op follow-up went well and staples/sutures were removed with instructions to follow-up in 4-6 weeks. Pt advised to wait at least 2 weeks for therapy after surgery and was given HEP by orthopedics which he is performing including quad sets, glut sets, ankle pumps, mini squats, and standing hip abduction.  History from PT evaluation 03/29/22: Pt reports R hip pain for multiple years with progressive worsening. Pain was initially in the anterior groin and posterior hip however the anterior hip pain has mostly resolved at this time with medication. He is now limping and walking with a hiking pole. Pain worsens throughout the day. Pt  reports progressive RLE weakness however he has not had any falls. Posterior hip pain has been nonresponsive to anti-inflammatories, oral steroids, gabapentin, and cortisone injections. Plain x-ray showed L5-S1 disc narrowing. Lumbar MRI ordered. "It has started to define my life." He used to like to walk and hike but hasn't been able to lately because of the pain. History of cervical pain and RUE radiculopathy.    PAIN:    Pain Intensity: Present: 0/10, Best: 0/10, Worst: 7/10 Pain location: Anterior and posterior R hip (mostly posterior) Pain Quality: sharp with transitions, occasional jabbing pain  Radiating: Yes, posterolateral RLE into the thigh but no longer down to the foot/ankle Numbness/Tingling: Yes, "prickly" in R thigh; Focal Weakness: Yes, RLE weakness since surgery related to pain; Aggravating factors: quick transitions, walking, in and out of bed; Relieving factors: History of prior back injury, pain, surgery, or therapy: No Dominant hand: right Imaging: Yes, see history Red flags: Positive for history of cancer (CLL), diarrhea since surgery, negative for bowel/bladder changes, saddle paresthesia, h/o spinal tumors, h/o compression fx, h/o abdominal aneurysm, chills/fever, night sweats, nausea, vomiting;  PRECAUTIONS: Posterior hip  WEIGHT BEARING RESTRICTIONS: No  FALLS: Has patient fallen in last 6 months? 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   Hobbies: Reading, walking, hiking, pastoral care;  Patient Goals: Pt would like to decrease his pain and be able to return to walking and hiking for leisure   OBJECTIVE: BP: 120/68 mmHg, HR: 68, SpO2:  98%;  Patient Surveys  FOTO 38, predicted improvement to 34 HOOS JR: to be completed;  Cognition Patient is oriented to person, place, and time.  Recent memory is intact.  Remote memory is intact.  Attention span and concentration are intact.  Expressive speech is intact.  Patient's fund of knowledge is within normal limits for educational level.  Gross Musculoskeletal Assessment Tremor: None Bulk: Normal Tone: Normal Incision is clean and dry and appears to be healing well. No erythema, edema, discharge, or ecchymosis noted around incision  GAIT: Antalgic gait on the RLE. Pt ambulates with combination of step-to and partial step through pattern with front wheeled walker. When practicing with rollator his gait is more normalized with step-through pattern.   Posture: Lumbar lordosis: WNL Iliac crest height: Equal bilaterally Lumbar lateral shift: Negative  AROM Deferred at this time. Time spent instructing pt in posterior hip precautions;  LE MMT: MMT (out of 5) Right  Left   Hip flexion 4* 5  Hip extension    Hip abduction (seated) 4+* 5  Hip adduction (seated) 4+* 5  Hip internal rotation (testing at neural) 5* 5  Hip external rotation 5* 5  Knee flexion (seated) 4+* 5  Knee extension 5 5  Ankle dorsiflexion 5 5  Ankle plantarflexion Strong Strong  Ankle inversion    Ankle eversion    (* = pain; Blank rows = not tested)  Sensation WNL bilaterally L5-S1. Proprioception, stereognosis, and hot/cold testing deferred on this date.  Functional Outcome Measures  Results Comments  BERG    DGI    FGA    TUG  28.3 seconds Increased fall risk, decreased function;  5TSTS     6 Minute Walk Test    10 Meter Gait Speed Self-selected: 17.5s = 0.57 m/s; Fastest: 9.4s = 1.06 m/s Self-selected gait speed below normative values for community ambulation;  (Blank rows = not tested)    TODAY'S TREATMENT   SUBJECTIVE: Pt reports that he is doing well today. He denies  any resting pain upon arrival. However continues with persistent stiffness after sitting and also reporting some intermittent lateral hip pain.    PAIN: Denies resting pain, pain increases with walking;   Ther-ex  NuStep L1 warm-up x 5 minutes followed by intervals L1/L6-8, 45s on/45s off for an additional 6 minutes followed by a 1 minute cool down (12 minutes total) for BLE strengthening with therapist monitoring fatigue and adjusting resistance accordingly; 6" forward BOSU step-ups with BUE support leading with RLE up x 10; BOSU R lateral lunges x 15;  Airex balance beam tandem balance with RLE in back x 30s; Airex balance beam side stepping 6' x multiple lengths;  Standing hip strengthening with 5# ankle weights: Hip flexion marches x 15 BLE; HS curls x 15 BLE; Hip abduction x 15 BLE; Hip extension x 15 BLE;  Supine R SLR x 5, R hip discomfort so discontinued; Hooklying clams with manual resistance from therapist x 15; Hoolklying adductor squeezes with manual resistance from therapist x 15; Hooklying bridges x 5; Hooklying bridge marches x 5 on each side;   Manual Therapy  STM with light effleurage and Hypervolt to R gluteus medius, pt reports tenderness and positive reproduction of pain; Education regarding options for home STM including foam roller, myofascia/tennis ball, and/or percussion massager.    Not performed: Nautilus resisted gait forward, backward, R lateral, and L lateral 60# x 3 each direction; Mini squats x 15; Seated LAQ with 4# ankle weights x 15 BLE; 6" L lateral heel taps to 2" airex pad x 10; 6" L lateral heel taps to floor x 10; Total Gym (TG) Level 22 (L22) double leg squats 2 x 10; TG L22 R single leg squats 2 x 10; TG L22 double leg heel raises 2 x 10;   PATIENT EDUCATION:  Education details: Pt educated throughout session  about proper posture and technique with exercises. Improved exercise technique, movement at target joints, use of target  muscles after min to mod verbal, visual, tactile cues. Home options for STM Person educated: Patient Education method: Explanation and demonstration Education comprehension: verbalized understanding and returned demonstration;   HOME EXERCISE PROGRAM:  Access Code: [email or web address] URL: [email or web address] Date: 07/24/2022 Prepared by: Ria Comment  Exercises - Mini Squat with Counter Support  - 1 x daily - 7 x weekly - 2 sets - 10 reps - Standing Knee Flexion with Counter Support (Mirrored)  - 1 x daily - 7 x weekly - 2 sets - 10 reps - Standing Hip Abduction with Counter Support (Mirrored)  - 1 x daily - 7 x weekly - 2 sets - 10 reps - Heel Raises with Counter Support  - 1 x daily - 7 x weekly - 2 sets - 10 reps - Standing Hip Extension with Counter Support (Mirrored)  - 1 x daily - 7 x weekly - 2 sets - 10 reps - Single Leg Stance (Mirrored)  - 1 x daily - 7 x weekly - 3 reps - 30s hold  Patient Education - THA Posterior Precautions Handout  Green, blue, and gray tbands;    ASSESSMENT:  CLINICAL IMPRESSION: Pt demonstrates excellent motivation during session today. Progressed strengthening during session and introduced uneven surfaces. He is reporting R lateral hip pain and has tenderness along the course of gluteus medius muscle. Performed STM during session and education offered regarding how he can perform at home. Therapy frequency will be maintained at 1x/wk with focus on independent exercise. Pt encouraged to follow-up as scheduled. Plan to progress strengthening at future visit. He is making excellent progress since surgery and will benefit from skilled PT services in order to improve gait, strength, and pain in order to improve function at home and with leisure activities such as hiking.  OBJECTIVE IMPAIRMENTS: Abnormal gait, decreased strength, and pain.   ACTIVITY LIMITATIONS: bending, sitting, squatting, and transfers  PARTICIPATION LIMITATIONS: cleaning,  driving, shopping, and community activity  PERSONAL FACTORS: 3+ comorbidities: CLL, OSA, OA, and neuropathy  are also affecting patient's functional outcome.   REHAB POTENTIAL: Excellent  CLINICAL DECISION MAKING: Evolving/moderate complexity  EVALUATION COMPLEXITY: Moderate   GOALS: Goals reviewed with patient? No  SHORT TERM GOALS: Target date: 08/13/2022   Pt will be independent with HEP in order to improve strength and decrease hip pain to improve pain-free function at home. Baseline:  Goal status: INITIAL   LONG TERM GOALS: Target date: 09/24/2022   Pt will increase FOTO to at least 58 to demonstrate significant improvement in function at home and with leisure activities related to hip pain  Baseline: 07/02/22: 31; Goal status: INITIAL  2.  Pt will decrease worst hip pain by at least 3 points on the NPRS in order to demonstrate clinically significant reduction in hip pain. Baseline: 07/01/21: worst: 7/10; Goal status: INITIAL  3.  Pt will increase HOOS Jr score by at least 18 points in order demonstrate clinically significant improvement in R hip pain and function      Baseline: 07/02/22: To be completed; 07/09/22: 11/28, Interval Score: 55.99/100 Goal status: INITIAL  4.  Pt will report at least 75% improvement in his hip symptoms in order to return to walking and hiking without notable increase in his pain.  Baseline:  Goal status: INITIAL   PLAN: PT FREQUENCY: 1-2x/week  PT DURATION: 12 weeks  PLANNED INTERVENTIONS: Therapeutic exercises, Therapeutic activity, Neuromuscular re-education,  Balance training, Gait training, Patient/Family education, Self Care, Joint mobilization, Joint manipulation, Vestibular training, Canalith repositioning, Orthotic/Fit training, DME instructions, Dry Needling, Electrical stimulation, Spinal manipulation, Spinal mobilization, Cryotherapy, Moist heat, Taping, Traction, Ultrasound, Ionotophoresis /ml Dexamethasone, Manual therapy, and  Re-evaluation.  PLAN FOR NEXT SESSION: review and modify HEP as necessary, progress strengthening and normalize gait;  Sharalyn Ink Kaylin Schellenberg PT, DPT, GCS  Niza Soderholm, PT 07/31/2022, 12:25 PM

## 2022-07-31 ENCOUNTER — Ambulatory Visit: Payer: Medicare Other

## 2022-07-31 DIAGNOSIS — M25551 Pain in right hip: Secondary | ICD-10-CM

## 2022-07-31 DIAGNOSIS — M6281 Muscle weakness (generalized): Secondary | ICD-10-CM

## 2022-07-31 DIAGNOSIS — R262 Difficulty in walking, not elsewhere classified: Secondary | ICD-10-CM

## 2022-08-07 ENCOUNTER — Ambulatory Visit: Payer: Medicare Other

## 2022-08-07 DIAGNOSIS — R262 Difficulty in walking, not elsewhere classified: Secondary | ICD-10-CM

## 2022-08-07 DIAGNOSIS — M25551 Pain in right hip: Secondary | ICD-10-CM

## 2022-08-07 DIAGNOSIS — M6281 Muscle weakness (generalized): Secondary | ICD-10-CM

## 2022-08-07 NOTE — Therapy (Signed)
OUTPATIENT PHYSICAL THERAPY HIP TREATMENT  Patient Name: Chad Gaines MRN: [id number] DOB:October 07, 1945, 77 y.o., male Today's Date: 08/07/2022  END OF SESSION:  PT End of Session - 08/07/22 0853     Visit Number 9    Number of Visits 25    Date for PT Re-Evaluation 09/24/22    Authorization Type Medicare / BCBS 2024  NF:[place]  no auth req    Authorization Time Period eval: 07/02/22    PT Start Time 0840    PT Stop Time 0930    PT Time Calculation (min) 50 min    Activity Tolerance Patient tolerated treatment well    Behavior During Therapy WFL for tasks assessed/performed            Past Medical History:  Diagnosis Date   Allergy to alpha-gal    GERD (gastroesophageal reflux disease)    Hypertension    Pinched nerve    some numbness R arm, R ankle   Sleep apnea    CPAP   Past Surgical History:  Procedure Laterality Date   APPENDECTOMY     CATARACT EXTRACTION W/PHACO Left 07/03/2021   Procedure: CATARACT EXTRACTION PHACO AND INTRAOCULAR LENS PLACEMENT (IOC) LEFT VIVITY TORIC 3.59 00:27.8;  Surgeon: Nevada Crane, MD;  Location: [facility];  Service: Ophthalmology;  Laterality: Left;  sleep apnea   CATARACT EXTRACTION W/PHACO Right 07/17/2021   Procedure: CATARACT EXTRACTION PHACO AND INTRAOCULAR LENS PLACEMENT (IOC) RIGHT VIVITY TORIC 4.20 00:33.2;  Surgeon: Nevada Crane, MD;  Location: [facility];  Service: Ophthalmology;  Laterality: Right;  sleep apnea   NASAL SEPTUM SURGERY     Patient Active Problem List   Diagnosis Date Noted   Difficulty in walking, not elsewhere classified 07/18/2022   Adult hypothyroidism 12/25/2014   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   Apnea, sleep 12/25/2014   Essential (primary) hypertension 08/05/2014   Chronic lymphatic leukemia 03/08/2014   Chronic lymphocytic leukemia 03/08/2014   PCP: Bethann Punches MD  REFERRING PROVIDER: Currie Paris MD  REFERRING DIAG:  Diagnosis  M16.11  (ICD-10-CM) - Unilateral primary osteoarthritis, right hip   RATIONALE FOR EVALUATION AND TREATMENT: Rehabilitation  THERAPY DIAG: Pain in right hip  Muscle weakness (generalized)  Difficulty in walking, not elsewhere classified  ONSET DATE: Multiple years  FOLLOW-UP APPT SCHEDULED WITH REFERRING PROVIDER: Yes   FROM INITIAL EVALUATION SUBJECTIVE:                                                                                                                                                                                         SUBJECTIVE STATEMENT:  R hip pain  PERTINENT HISTORY:  Pt underwent R posterior approach THR on 06/12/22 without reported post-op complications. He was discharged on baby ASA for VTE and has been ambulating with a front wheeled walker since the surgery. He has tried using a rollator and notes that it is easier to use for walking however reports that it causes him to lean forward excessively. He has been off Tramadol for the last week and is using Tylenol to manage the pain. He reports that his sleep has been very disrupted since the surgery. He is also complaining of diarrhea since the surgery as well as mild DOE with activity. No falls reported. No chills, fevers, night sweats. No erythema, drainage, or LE swelling since the surgery. His post-op follow-up went well and staples/sutures were removed with instructions to follow-up in 4-6 weeks. Pt advised to wait at least 2 weeks for therapy after surgery and was given HEP by orthopedics which he is performing including quad sets, glut sets, ankle pumps, mini squats, and standing hip abduction.  History from PT evaluation 03/29/22: Pt reports R hip pain for multiple years with progressive worsening. Pain was initially in the anterior groin and posterior hip however the anterior hip pain has mostly resolved at this time with medication. He is now limping and walking with a hiking pole. Pain worsens throughout the day. Pt  reports progressive RLE weakness however he has not had any falls. Posterior hip pain has been nonresponsive to anti-inflammatories, oral steroids, gabapentin, and cortisone injections. Plain x-ray showed L5-S1 disc narrowing. Lumbar MRI ordered. "It has started to define my life." He used to like to walk and hike but hasn't been able to lately because of the pain. History of cervical pain and RUE radiculopathy.    PAIN:    Pain Intensity: Present: 0/10, Best: 0/10, Worst: 7/10 Pain location: Anterior and posterior R hip (mostly posterior) Pain Quality: sharp with transitions, occasional jabbing pain  Radiating: Yes, posterolateral RLE into the thigh but no longer down to the foot/ankle Numbness/Tingling: Yes, "prickly" in R thigh; Focal Weakness: Yes, RLE weakness since surgery related to pain; Aggravating factors: quick transitions, walking, in and out of bed; Relieving factors: History of prior back injury, pain, surgery, or therapy: No Dominant hand: right Imaging: Yes, see history Red flags: Positive for history of cancer (CLL), diarrhea since surgery, negative for bowel/bladder changes, saddle paresthesia, h/o spinal tumors, h/o compression fx, h/o abdominal aneurysm, chills/fever, night sweats, nausea, vomiting;  PRECAUTIONS: Posterior hip  WEIGHT BEARING RESTRICTIONS: No  FALLS: Has patient fallen in last 6 months? 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   Hobbies: Reading, walking, hiking, pastoral care;  Patient Goals: Pt would like to decrease his pain and be able to return to walking and hiking for leisure   OBJECTIVE: BP: 120/68 mmHg, HR: 68, SpO2:  98%;  Patient Surveys  FOTO 38, predicted improvement to 34 HOOS JR: to be completed;  Cognition Patient is oriented to person, place, and time.  Recent memory is intact.  Remote memory is intact.  Attention span and concentration are intact.  Expressive speech is intact.  Patient's fund of knowledge is within normal limits for educational level.  Gross Musculoskeletal Assessment Tremor: None Bulk: Normal Tone: Normal Incision is clean and dry and appears to be healing well. No erythema, edema, discharge, or ecchymosis noted around incision  GAIT: Antalgic gait on the RLE. Pt ambulates with combination of step-to and partial step through pattern with front wheeled walker. When practicing with rollator his gait is more normalized with step-through pattern.   Posture: Lumbar lordosis: WNL Iliac crest height: Equal bilaterally Lumbar lateral shift: Negative  AROM Deferred at this time. Time spent instructing pt in posterior hip precautions;  LE MMT: MMT (out of 5) Right  Left   Hip flexion 4* 5  Hip extension    Hip abduction (seated) 4+* 5  Hip adduction (seated) 4+* 5  Hip internal rotation (testing at neural) 5* 5  Hip external rotation 5* 5  Knee flexion (seated) 4+* 5  Knee extension 5 5  Ankle dorsiflexion 5 5  Ankle plantarflexion Strong Strong  Ankle inversion    Ankle eversion    (* = pain; Blank rows = not tested)  Sensation WNL bilaterally L5-S1. Proprioception, stereognosis, and hot/cold testing deferred on this date.  Functional Outcome Measures  Results Comments  BERG    DGI    FGA    TUG  28.3 seconds Increased fall risk, decreased function;  5TSTS     6 Minute Walk Test    10 Meter Gait Speed Self-selected: 17.5s = 0.57 m/s; Fastest: 9.4s = 1.06 m/s Self-selected gait speed below normative values for community ambulation;  (Blank rows = not tested)    TODAY'S TREATMENT   SUBJECTIVE: Pt reports that he is doing well today. He denies  any resting pain upon arrival. No specific questions or concerns currently.    PAIN: Denies resting pain, pain increases with walking;   Ther-ex  NuStep L1 warm-up x 10 minutes for BLE strengthening; Total Gym (TG) Level 22 (L22) double leg squats x 10, added 2, 6# dumbbells (DB) x 10; TG L22 double leg heel raises with 2, 6# DB 2 x 10; TG L22 R single leg squats 2 x 10; Nautilus resisted gait 50# forward, backwards, R lateral, and L lateral x 5 each;   Manual Therapy  STM with light effleurage, theraband roller, and Hypervolt to R gluteus medius and posterior hip external rotators; Cold pack applied to R lateral/posterior hip during HEP review;   Not performed: Seated LAQ with 4# ankle weights x 15 BLE; 6" L lateral heel taps to 2" airex pad x 10; 6" L lateral heel taps to floor x 10; 6" forward BOSU step-ups with BUE support leading with RLE up x 10; BOSU R lateral lunges x 15; Airex balance beam tandem balance with RLE in back x 30s; Airex balance beam side stepping 6' x multiple lengths; Standing hip strengthening with 5# ankle weights: Hip flexion marches x 15 BLE; HS curls x 15 BLE; Hip abduction x 15 BLE; Hip extension x 15 BLE; Supine R SLR x 5, R hip discomfort so discontinued; Hooklying clams with manual resistance from therapist x 15; Hoolklying adductor squeezes with manual resistance from therapist x 15; Hooklying bridges x 5; Hooklying bridge marches x 5 on each side;   PATIENT EDUCATION:  Education details: Pt educated throughout session about proper posture and technique with exercises. Improved exercise technique, movement at target joints, use of target muscles after min to mod verbal, visual, tactile cues. Home options for STM Person educated: Patient Education method: Explanation and demonstration Education comprehension: verbalized understanding and  returned demonstration;   HOME EXERCISE PROGRAM:  Access Code: [email or web address] URL:  [email or web address] Date: 07/24/2022 Prepared by: Ria Comment  Exercises - Mini Squat with Counter Support  - 1 x daily - 7 x weekly - 2 sets - 10 reps - Standing Knee Flexion with Counter Support (Mirrored)  - 1 x daily - 7 x weekly - 2 sets - 10 reps - Standing Hip Abduction with Counter Support (Mirrored)  - 1 x daily - 7 x weekly - 2 sets - 10 reps - Heel Raises with Counter Support  - 1 x daily - 7 x weekly - 2 sets - 10 reps - Standing Hip Extension with Counter Support (Mirrored)  - 1 x daily - 7 x weekly - 2 sets - 10 reps - Single Leg Stance (Mirrored)  - 1 x daily - 7 x weekly - 3 reps - 30s hold  Patient Education - THA Posterior Precautions Handout  Green, blue, and gray tbands;    ASSESSMENT:  CLINICAL IMPRESSION: Pt demonstrates excellent motivation during session today. Progressed strengthening during session including resisted gait and external weight on Total Gym. He continues reporting R lateral hip pain which responds well to light STM. Therapy frequency will be maintained at 1x/wk with focus on independent exercise. Pt encouraged to follow-up as scheduled. Plan to progress strengthening at future visit. He is making excellent progress since surgery and will benefit from skilled PT services in order to improve gait, strength, and pain in order to improve function at home and with leisure activities such as hiking.  OBJECTIVE IMPAIRMENTS: Abnormal gait, decreased strength, and pain.   ACTIVITY LIMITATIONS: bending, sitting, squatting, and transfers  PARTICIPATION LIMITATIONS: cleaning, driving, shopping, and community activity  PERSONAL FACTORS: 3+ comorbidities: CLL, OSA, OA, and neuropathy  are also affecting patient's functional outcome.   REHAB POTENTIAL: Excellent  CLINICAL DECISION MAKING: Evolving/moderate complexity  EVALUATION COMPLEXITY: Moderate   GOALS: Goals reviewed with patient? No  SHORT TERM GOALS: Target date: 08/13/2022    Pt will be independent with HEP in order to improve strength and decrease hip pain to improve pain-free function at home. Baseline:  Goal status: INITIAL   LONG TERM GOALS: Target date: 09/24/2022   Pt will increase FOTO to at least 58 to demonstrate significant improvement in function at home and with leisure activities related to hip pain  Baseline: 07/02/22: 31; Goal status: INITIAL  2.  Pt will decrease worst hip pain by at least 3 points on the NPRS in order to demonstrate clinically significant reduction in hip pain. Baseline: 07/01/21: worst: 7/10; Goal status: INITIAL  3.  Pt will increase HOOS Jr score by at least 18 points in order demonstrate clinically significant improvement in R hip pain and function      Baseline: 07/02/22: To be completed; 07/09/22: 11/28, Interval Score: 55.99/100 Goal status: INITIAL  4.  Pt will report at least 75% improvement in his hip symptoms in order to return to walking and hiking without notable increase in his pain.  Baseline:  Goal status: INITIAL   PLAN: PT FREQUENCY: 1-2x/week  PT DURATION: 12 weeks  PLANNED INTERVENTIONS: Therapeutic exercises, Therapeutic activity, Neuromuscular re-education, Balance training, Gait training, Patient/Family education, Self Care, Joint mobilization, Joint manipulation, Vestibular training, Canalith repositioning, Orthotic/Fit training, DME instructions, Dry Needling, Electrical stimulation, Spinal manipulation, Spinal mobilization, Cryotherapy, Moist heat, Taping, Traction, Ultrasound, Ionotophoresis /ml Dexamethasone, Manual therapy, and Re-evaluation.  PLAN FOR NEXT SESSION: review and modify HEP as necessary, progress strengthening and normalize  gait;  Sharalyn Ink Corin Tilly PT, DPT, GCS  Rue Valladares, PT 08/07/2022, 1:19 PM

## 2022-08-14 ENCOUNTER — Ambulatory Visit: Payer: Medicare Other

## 2022-08-14 DIAGNOSIS — R262 Difficulty in walking, not elsewhere classified: Secondary | ICD-10-CM

## 2022-08-14 DIAGNOSIS — M25551 Pain in right hip: Secondary | ICD-10-CM | POA: Diagnosis not present

## 2022-08-14 DIAGNOSIS — M6281 Muscle weakness (generalized): Secondary | ICD-10-CM

## 2022-08-14 NOTE — Therapy (Signed)
OUTPATIENT PHYSICAL THERAPY HIP TREATMENT/PROGRESS NOTE  Dates of reporting period  07/02/22   to   08/14/22   Patient Name: Chad Gaines MRN: [id number] DOB:04-16-1946, 77 y.o., male Today's Date: 08/14/2022  END OF SESSION:  PT End of Session - 08/14/22 0903     Visit Number 10    Number of Visits 25    Date for PT Re-Evaluation 09/24/22    Authorization Type Medicare / BCBS 2024  [place]  no auth req    Authorization Time Period eval: 07/02/22    PT Start Time 0843    PT Stop Time 0930    PT Time Calculation (min) 47 min    Activity Tolerance Patient tolerated treatment well    Behavior During Therapy WFL for tasks assessed/performed            Past Medical History:  Diagnosis Date   Allergy to alpha-gal    GERD (gastroesophageal reflux disease)    Hypertension    Pinched nerve    some numbness R arm, R ankle   Sleep apnea    CPAP   Past Surgical History:  Procedure Laterality Date   APPENDECTOMY     CATARACT EXTRACTION W/PHACO Left 07/03/2021   Procedure: CATARACT EXTRACTION PHACO AND INTRAOCULAR LENS PLACEMENT (IOC) LEFT VIVITY TORIC 3.59 00:27.8;  Surgeon: Nevada Crane, MD;  Location: [facility];  Service: Ophthalmology;  Laterality: Left;  sleep apnea   CATARACT EXTRACTION W/PHACO Right 07/17/2021   Procedure: CATARACT EXTRACTION PHACO AND INTRAOCULAR LENS PLACEMENT (IOC) RIGHT VIVITY TORIC 4.20 00:33.2;  Surgeon: Nevada Crane, MD;  Location: [facility];  Service: Ophthalmology;  Laterality: Right;  sleep apnea   NASAL SEPTUM SURGERY     Patient Active Problem List   Diagnosis Date Noted   Difficulty in walking, not elsewhere classified 07/18/2022   Adult hypothyroidism 12/25/2014   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   Apnea, sleep 12/25/2014   Essential (primary) hypertension 08/05/2014   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HCC) 03/08/2014   Chronic lymphocytic leukemia (HCC) 03/08/2014   PCP: Bethann Punches MD  REFERRING  PROVIDER: Currie Paris MD  REFERRING DIAG:  Diagnosis  M16.11 (ICD-10-CM) - Unilateral primary osteoarthritis, right hip   RATIONALE FOR EVALUATION AND TREATMENT: Rehabilitation  THERAPY DIAG: Pain in right hip  Muscle weakness (generalized)  Difficulty in walking, not elsewhere classified  ONSET DATE: Multiple years  FOLLOW-UP APPT SCHEDULED WITH REFERRING PROVIDER: Yes   FROM INITIAL EVALUATION SUBJECTIVE:  SUBJECTIVE STATEMENT:  R hip pain  PERTINENT HISTORY:  Pt underwent R posterior approach THR on 06/12/22 without reported post-op complications. He was discharged on baby ASA for VTE and has been ambulating with a front wheeled walker since the surgery. He has tried using a rollator and notes that it is easier to use for walking however reports that it causes him to lean forward excessively. He has been off Tramadol for the last week and is using Tylenol to manage the pain. He reports that his sleep has been very disrupted since the surgery. He is also complaining of diarrhea since the surgery as well as mild DOE with activity. No falls reported. No chills, fevers, night sweats. No erythema, drainage, or LE swelling since the surgery. His post-op follow-up went well and staples/sutures were removed with instructions to follow-up in 4-6 weeks. Pt advised to wait at least 2 weeks for therapy after surgery and was given HEP by orthopedics which he is performing including quad sets, glut sets, ankle pumps, mini squats, and standing hip abduction.  History from PT evaluation 03/29/22: Pt reports R hip pain for multiple years with progressive worsening. Pain was initially in the anterior groin and posterior hip however the anterior hip pain has mostly resolved at this time with medication. He is now  limping and walking with a hiking pole. Pain worsens throughout the day. Pt reports progressive RLE weakness however he has not had any falls. Posterior hip pain has been nonresponsive to anti-inflammatories, oral steroids, gabapentin, and cortisone injections. Plain x-ray showed L5-S1 disc narrowing. Lumbar MRI ordered. "It has started to define my life." He used to like to walk and hike but hasn't been able to lately because of the pain. History of cervical pain and RUE radiculopathy.    PAIN:    Pain Intensity: Present: 0/10, Best: 0/10, Worst: 7/10 Pain location: Anterior and posterior R hip (mostly posterior) Pain Quality: sharp with transitions, occasional jabbing pain  Radiating: Yes, posterolateral RLE into the thigh but no longer down to the foot/ankle Numbness/Tingling: Yes, "prickly" in R thigh; Focal Weakness: Yes, RLE weakness since surgery related to pain; Aggravating factors: quick transitions, walking, in and out of bed; Relieving factors: History of prior back injury, pain, surgery, or therapy: No Dominant hand: right Imaging: Yes, see history Red flags: Positive for history of cancer (CLL), diarrhea since surgery, negative for bowel/bladder changes, saddle paresthesia, h/o spinal tumors, h/o compression fx, h/o abdominal aneurysm, chills/fever, night sweats, nausea, vomiting;  PRECAUTIONS: Posterior hip  WEIGHT BEARING RESTRICTIONS: No  FALLS: Has patient fallen in last 6 months? 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   Hobbies: Reading, walking, hiking, pastoral care;  Patient Goals: Pt would like to decrease his pain and be able to return to  walking and hiking for leisure   OBJECTIVE: BP: 120/68 mmHg, HR: 68, SpO2: 98%;  Patient Surveys  FOTO 38, predicted improvement to 58 HOOS JR: to be completed;  Cognition Patient is oriented to person, place, and time.  Recent memory is intact.  Remote memory is intact.  Attention span and concentration are intact.  Expressive speech is intact.  Patient's fund of knowledge is within normal  limits for educational level.    Gross Musculoskeletal Assessment Tremor: None Bulk: Normal Tone: Normal Incision is clean and dry and appears to be healing well. No erythema, edema, discharge, or ecchymosis noted around incision  GAIT: Antalgic gait on the RLE. Pt ambulates with combination of step-to and partial step through pattern with front wheeled walker. When practicing with rollator his gait is more normalized with step-through pattern.   Posture: Lumbar lordosis: WNL Iliac crest height: Equal bilaterally Lumbar lateral shift: Negative  AROM Deferred at this time. Time spent instructing pt in posterior hip precautions;  LE MMT: MMT (out of 5) Right  Left   Hip flexion 4* 5  Hip extension    Hip abduction (seated) 4+* 5  Hip adduction (seated) 4+* 5  Hip internal rotation (testing at neural) 5* 5  Hip external rotation 5* 5  Knee flexion (seated) 4+* 5  Knee extension 5 5  Ankle dorsiflexion 5 5  Ankle plantarflexion Strong Strong  Ankle inversion    Ankle eversion    (* = pain; Blank rows = not tested)  Sensation WNL bilaterally L5-S1. Proprioception, stereognosis, and hot/cold testing deferred on this date.  Functional Outcome Measures  Results Comments  BERG    DGI    FGA    TUG  28.3 seconds Increased fall risk, decreased function;  5TSTS     6 Minute Walk Test    10 Meter Gait Speed Self-selected: 17.5s = 0.57 m/s; Fastest: 9.4s = 1.06 m/s Self-selected gait speed below normative values for community ambulation;  (Blank rows = not tested)    TODAY'S  TREATMENT   SUBJECTIVE: Pt reports that he is doing well today. He denies any resting pain upon arrival. He saw Dr. Everardo Beals and no changes to report. No specific questions or concerns currently.    PAIN: Denies resting pain, pain increases with walking;   Ther-ex  NuStep L2 warm-up x 3 minutes followed by intervals L1/L8, 30s on/45s off for an additional 6 minutes followed by a 1 minute cool down (12 minutes total) for BLE strengthening with therapist monitoring fatigue and adjusting resistance accordingly; 6" forward step-ups leading with RLE up/LLE down x 10; 6" L lateral heel taps to 2" airex pad x 10; Total Gym (TG) Level 22 (L22) double leg squats x 10, added 2, 6# dumbbells (DB) x 10; TG L22 double leg heel raises with 2, 6# DB 2 x 10; TG L22 R single leg squats 2 x 10;  Standing hip strengthening with 7.5# ankle weights (AW): Hip flexion marches x 10 BLE; HS curls x 10 BLE; Hip abduction x 10 BLE; Hip extension x 10 BLE;  Seated LAQ with 7.5# AW 2 x 10 BLE; Seated clams with manual resistance 2 x 10; Seated adductor squeeze with manual resistance 2 x 10; Split squats alternating forward LE x 10 on each side; Resisted side stepping with green tband around ankles in // bars x multiple laps;   Not performed: 6" forward BOSU step-ups with BUE support leading with RLE up x 10; BOSU R lateral lunges x 15; Airex balance beam tandem balance with RLE in back x 30s; Airex balance beam side stepping 6' x multiple lengths; Hooklying clams with manual resistance from therapist x 15; Hoolklying adductor squeezes with manual resistance from therapist x 15; Hooklying bridges x 5; Hooklying bridge marches x 5 on each side; Nautilus resisted gait 50# forward, backwards, R lateral, and L lateral x 5 each;   PATIENT EDUCATION:  Education details: Pt educated throughout session about proper posture and technique with exercises. Improved exercise technique, movement at target joints,  use of target muscles after min to mod verbal, visual, tactile cues. Home options for STM Person educated: Patient Education method: Explanation and demonstration Education comprehension: verbalized understanding and returned demonstration;   HOME EXERCISE PROGRAM:  Access Code: [email or web address] URL: [email or web address] Date: 07/24/2022 Prepared by: Ria Comment  Exercises - Mini Squat with Counter Support  - 1 x daily - 7 x weekly - 2 sets - 10 reps - Standing Knee Flexion with Counter Support (Mirrored)  - 1 x daily - 7 x weekly - 2 sets - 10 reps - Standing Hip Abduction with Counter Support (Mirrored)  - 1 x daily - 7 x weekly - 2 sets - 10 reps - Heel Raises with Counter Support  - 1 x daily - 7 x weekly - 2 sets - 10 reps - Standing Hip Extension with Counter Support (Mirrored)  - 1 x daily - 7 x weekly - 2 sets - 10 reps - Single Leg Stance (Mirrored)  - 1 x daily - 7 x weekly - 3 reps - 30s hold  Patient Education - THA Posterior Precautions Handout  Green, blue, and gray tbands;    ASSESSMENT:  CLINICAL IMPRESSION: Pt demonstrates excellent motivation during session today. Progressed strengthening during session including an increase in ankle weights today. He reports some improvement in his R lateral hip pain since the last therapy session. Therapy frequency will be maintained at 1x/wk with focus on independent exercise. Plan to update outcome measures/goals at next session. Pt encouraged to follow-up as scheduled. He is making excellent progress since surgery and will benefit from skilled PT services in order to improve gait, strength, and pain in order to improve function at home and with leisure activities such as hiking.  OBJECTIVE IMPAIRMENTS: Abnormal gait, decreased strength, and pain.   ACTIVITY LIMITATIONS: bending, sitting, squatting, and transfers  PARTICIPATION LIMITATIONS: cleaning, driving, shopping, and community activity  PERSONAL FACTORS: 3+  comorbidities: CLL, OSA, OA, and neuropathy  are also affecting patient's functional outcome.   REHAB POTENTIAL: Excellent  CLINICAL DECISION MAKING: Evolving/moderate complexity  EVALUATION COMPLEXITY: Moderate   GOALS: Goals reviewed with patient? No  SHORT TERM GOALS: Target date: 08/13/2022   Pt will be independent with HEP in order to improve strength and decrease hip pain to improve pain-free function at home. Baseline:  Goal status: INITIAL   LONG TERM GOALS: Target date: 09/24/2022   Pt will increase FOTO to at least 58 to demonstrate significant improvement in function at home and with leisure activities related to hip pain  Baseline: 07/02/22: 31; Goal status: INITIAL  2.  Pt will decrease worst hip pain by at least 3 points on the NPRS in order to demonstrate clinically significant reduction in hip pain. Baseline: 07/01/21: worst: 7/10; Goal status: INITIAL  3.  Pt will increase HOOS Jr score by at least 18 points in order demonstrate clinically significant improvement in R hip pain and function      Baseline: 07/02/22: To be completed; 07/09/22: 11/28, Interval Score: 55.99/100 Goal status: INITIAL  4.  Pt will report at least 75% improvement in his hip symptoms in order to return to walking and hiking without notable increase in his pain.  Baseline:  Goal status: INITIAL   PLAN: PT FREQUENCY: 1-2x/week  PT DURATION: 12 weeks  PLANNED INTERVENTIONS: Therapeutic exercises, Therapeutic activity, Neuromuscular re-education, Balance training, Gait training, Patient/Family education,  Self Care, Joint mobilization, Joint manipulation, Vestibular training, Canalith repositioning, Orthotic/Fit training, DME instructions, Dry Needling, Electrical stimulation, Spinal manipulation, Spinal mobilization, Cryotherapy, Moist heat, Taping, Traction, Ultrasound, Ionotophoresis 4mg /ml Dexamethasone, Manual therapy, and Re-evaluation.  PLAN FOR NEXT SESSION: update outcome  measures/goals, review and modify HEP as necessary, progress strengthening and normalize gait;  Sharalyn Ink Marti Acebo PT, DPT, GCS  Aceton Kinnear, PT 08/14/2022, 12:52 PM

## 2022-08-20 NOTE — Therapy (Signed)
OUTPATIENT PHYSICAL THERAPY HIP TREATMENT  Patient Name: Chad Gaines MRN: [id number] DOB:11-07-1945, 77 y.o., male Today's Date: 08/21/2022  END OF SESSION:  PT End of Session - 08/21/22 1028     Visit Number 11    Number of Visits 25    Date for PT Re-Evaluation 09/24/22    Authorization Type Medicare / BCBS 2024  [id number] on [place]  no auth req    Authorization Time Period eval: 07/02/22    PT Start Time 0846    PT Stop Time 0930    PT Time Calculation (min) 44 min    Activity Tolerance Patient tolerated treatment well    Behavior During Therapy WFL for tasks assessed/performed            Past Medical History:  Diagnosis Date   Allergy to alpha-gal    GERD (gastroesophageal reflux disease)    Hypertension    Pinched nerve    some numbness R arm, R ankle   Sleep apnea    CPAP   Past Surgical History:  Procedure Laterality Date   APPENDECTOMY     CATARACT EXTRACTION W/PHACO Left 07/03/2021   Procedure: CATARACT EXTRACTION PHACO AND INTRAOCULAR LENS PLACEMENT (IOC) LEFT VIVITY TORIC 3.59 00:27.8;  Surgeon: Nevada Crane, MD;  Location: [facility];  Service: Ophthalmology;  Laterality: Left;  sleep apnea   CATARACT EXTRACTION W/PHACO Right 07/17/2021   Procedure: CATARACT EXTRACTION PHACO AND INTRAOCULAR LENS PLACEMENT (IOC) RIGHT VIVITY TORIC 4.20 00:33.2;  Surgeon: Nevada Crane, MD;  Location: [facility];  Service: Ophthalmology;  Laterality: Right;  sleep apnea   NASAL SEPTUM SURGERY     Patient Active Problem List   Diagnosis Date Noted   Difficulty in walking, not elsewhere classified 07/18/2022   Adult hypothyroidism 12/25/2014   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   Apnea, sleep 12/25/2014   Essential (primary) hypertension 08/05/2014   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HCC) 03/08/2014   Chronic lymphocytic leukemia (HCC) 03/08/2014   PCP: Bethann Punches MD  REFERRING PROVIDER: Currie Paris MD  REFERRING DIAG:  Diagnosis   M16.11 (ICD-10-CM) - Unilateral primary osteoarthritis, right hip   RATIONALE FOR EVALUATION AND TREATMENT: Rehabilitation  THERAPY DIAG: Pain in right hip  Muscle weakness (generalized)  Difficulty in walking, not elsewhere classified  ONSET DATE: Multiple years  FOLLOW-UP APPT SCHEDULED WITH REFERRING PROVIDER: Yes   FROM INITIAL EVALUATION SUBJECTIVE:                                                                                                                                                                                         SUBJECTIVE STATEMENT:  R  hip pain  PERTINENT HISTORY:  Pt underwent R posterior approach THR on 06/12/22 without reported post-op complications. He was discharged on baby ASA for VTE and has been ambulating with a front wheeled walker since the surgery. He has tried using a rollator and notes that it is easier to use for walking however reports that it causes him to lean forward excessively. He has been off Tramadol for the last week and is using Tylenol to manage the pain. He reports that his sleep has been very disrupted since the surgery. He is also complaining of diarrhea since the surgery as well as mild DOE with activity. No falls reported. No chills, fevers, night sweats. No erythema, drainage, or LE swelling since the surgery. His post-op follow-up went well and staples/sutures were removed with instructions to follow-up in 4-6 weeks. Pt advised to wait at least 2 weeks for therapy after surgery and was given HEP by orthopedics which he is performing including quad sets, glut sets, ankle pumps, mini squats, and standing hip abduction.  History from PT evaluation 03/29/22: Pt reports R hip pain for multiple years with progressive worsening. Pain was initially in the anterior groin and posterior hip however the anterior hip pain has mostly resolved at this time with medication. He is now limping and walking with a hiking pole. Pain worsens throughout the  day. Pt reports progressive RLE weakness however he has not had any falls. Posterior hip pain has been nonresponsive to anti-inflammatories, oral steroids, gabapentin, and cortisone injections. Plain x-ray showed L5-S1 disc narrowing. Lumbar MRI ordered. "It has started to define my life." He used to like to walk and hike but hasn't been able to lately because of the pain. History of cervical pain and RUE radiculopathy.    PAIN:    Pain Intensity: Present: 0/10, Best: 0/10, Worst: 7/10 Pain location: Anterior and posterior R hip (mostly posterior) Pain Quality: sharp with transitions, occasional jabbing pain  Radiating: Yes, posterolateral RLE into the thigh but no longer down to the foot/ankle Numbness/Tingling: Yes, "prickly" in R thigh; Focal Weakness: Yes, RLE weakness since surgery related to pain; Aggravating factors: quick transitions, walking, in and out of bed; Relieving factors: History of prior back injury, pain, surgery, or therapy: No Dominant hand: right Imaging: Yes, see history Red flags: Positive for history of cancer (CLL), diarrhea since surgery, negative for bowel/bladder changes, saddle paresthesia, h/o spinal tumors, h/o compression fx, h/o abdominal aneurysm, chills/fever, night sweats, nausea, vomiting;  PRECAUTIONS: Posterior hip  WEIGHT BEARING RESTRICTIONS: No  FALLS: Has patient fallen in last 6 months? 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   Hobbies: Reading, walking, hiking, pastoral care;  Patient Goals: Pt would like to decrease his pain and be able to return to walking and hiking for leisure   OBJECTIVE: BP: 120/68 mmHg, HR: 68,  SpO2: 98%;  Patient Surveys  FOTO 38, predicted improvement to 46 HOOS JR: to be completed;  Cognition Patient is oriented to person, place, and time.  Recent memory is intact.  Remote memory is intact.  Attention span and concentration are intact.  Expressive speech is intact.  Patient's fund of knowledge is within normal limits for educational level.  Gross Musculoskeletal Assessment Tremor: None Bulk: Normal Tone: Normal Incision is clean and dry and appears to be healing well. No erythema, edema, discharge, or ecchymosis noted around incision  GAIT: Antalgic gait on the RLE. Pt ambulates with combination of step-to and partial step through pattern with front wheeled walker. When practicing with rollator his gait is more normalized with step-through pattern.   Posture: Lumbar lordosis: WNL Iliac crest height: Equal bilaterally Lumbar lateral shift: Negative  AROM Deferred at this time. Time spent instructing pt in posterior hip precautions;  LE MMT: MMT (out of 5) Right  Left   Hip flexion 4* 5  Hip extension    Hip abduction (seated) 4+* 5  Hip adduction (seated) 4+* 5  Hip internal rotation (testing at neural) 5* 5  Hip external rotation 5* 5  Knee flexion (seated) 4+* 5  Knee extension 5 5  Ankle dorsiflexion 5 5  Ankle plantarflexion Strong Strong  Ankle inversion    Ankle eversion    (* = pain; Blank rows = not tested)  Sensation WNL bilaterally L5-S1. Proprioception, stereognosis, and hot/cold testing deferred on this date.  Functional Outcome Measures  Results Comments  BERG    DGI    FGA    TUG  28.3 seconds Increased fall risk, decreased function;  5TSTS     6 Minute Walk Test    10 Meter Gait Speed Self-selected: 17.5s = 0.57 m/s; Fastest: 9.4s = 1.06 m/s Self-selected gait speed below normative values for community ambulation;  (Blank rows = not tested)    TODAY'S TREATMENT   SUBJECTIVE: Pt reports that he is doing well today. He  denies any resting pain upon arrival but continues with stiffness. His R lateral hip pain has been improving but he has been having some R lateral thigh pain when walking.   PAIN: Denies resting pain, pain increases with walking especially in R lateral thigh;   Ther-ex  NuStep L2 warm-up x 3 minutes followed by intervals L1/L8, 30s on/45s off for an additional 7 minutes followed by a 1 minute cool down (11 minutes total) for BLE strengthening with therapist monitoring fatigue and adjusting resistance for interval and for appropriate challenge. Supine R SLR 2 x 10; Hooklying clams with manual resistance from therapist 2 x 10; Hooklying adductor squeezes with manual resistance from therapist 2 x 10; Hooklying bridges x 10; Hooklying bridge marches x 10 on each side; Sidelying R hip abduction 2 x 10;   Manual Therapy  STM to R IT band with Theraband roller. Discussed ITB syndrome;   Not performed: 6" forward BOSU step-ups with BUE support leading with RLE up x 10; BOSU R lateral lunges x 15; Airex balance beam tandem balance with RLE in back x 30s; Airex balance beam side stepping 6' x multiple lengths; Nautilus resisted gait 50# forward, backwards, R lateral, and L lateral x 5 each; 6" L lateral heel taps to 2" airex pad x 10; Total Gym (TG) Level 22 (L22) double leg squats x 10, added 2, 6# dumbbells (DB) x 10; TG L22 double leg heel raises with 2, 6# DB 2 x 10; TG L22 R single leg squats 2 x 10; Standing hip strengthening with 7.5# ankle weights (AW): Hip flexion marches x 10 BLE; HS curls x 10 BLE; Hip abduction x 10 BLE; Hip extension x 10 BLE; Seated LAQ with 7.5# AW 2 x 10 BLE; Split squats alternating forward LE x 10 on each side; Resisted side stepping with green tband  around ankles in // bars x multiple laps;   PATIENT EDUCATION:  Education details: Pt educated throughout session about proper posture and technique with exercises. Improved exercise technique, movement  at target joints, use of target muscles after min to mod verbal, visual, tactile cues.  Person educated: Patient Education method: Explanation  Education comprehension: verbalized understanding    HOME EXERCISE PROGRAM:  Access Code: [email or web address] URL: [email or web address] Date: 07/24/2022 Prepared by: Ria Comment  Exercises - Mini Squat with Counter Support  - 1 x daily - 7 x weekly - 2 sets - 10 reps - Standing Knee Flexion with Counter Support (Mirrored)  - 1 x daily - 7 x weekly - 2 sets - 10 reps - Standing Hip Abduction with Counter Support (Mirrored)  - 1 x daily - 7 x weekly - 2 sets - 10 reps - Heel Raises with Counter Support  - 1 x daily - 7 x weekly - 2 sets - 10 reps - Standing Hip Extension with Counter Support (Mirrored)  - 1 x daily - 7 x weekly - 2 sets - 10 reps - Single Leg Stance (Mirrored)  - 1 x daily - 7 x weekly - 3 reps - 30s hold  Patient Education - THA Posterior Precautions Handout  Green, blue, and gray tbands;    ASSESSMENT:  CLINICAL IMPRESSION: Pt demonstrates excellent motivation during session today. Progressed strengthening during session. He has been having some R lateral thigh pain so STM performed to R ITB. Therapy frequency will be maintained at 1x/wk with focus on independent exercise. Plan to update outcome measures/goals at next session. Pt encouraged to follow-up as scheduled. He is making excellent progress since surgery and will benefit from skilled PT services in order to improve gait, strength, and pain in order to improve function at home and with leisure activities such as hiking.  OBJECTIVE IMPAIRMENTS: Abnormal gait, decreased strength, and pain.   ACTIVITY LIMITATIONS: bending, sitting, squatting, and transfers  PARTICIPATION LIMITATIONS: cleaning, driving, shopping, and community activity  PERSONAL FACTORS: 3+ comorbidities: CLL, OSA, OA, and neuropathy  are also affecting patient's functional outcome.   REHAB  POTENTIAL: Excellent  CLINICAL DECISION MAKING: Evolving/moderate complexity  EVALUATION COMPLEXITY: Moderate   GOALS: Goals reviewed with patient? No  SHORT TERM GOALS: Target date: 08/13/2022   Pt will be independent with HEP in order to improve strength and decrease hip pain to improve pain-free function at home. Baseline:  Goal status: INITIAL   LONG TERM GOALS: Target date: 09/24/2022   Pt will increase FOTO to at least 58 to demonstrate significant improvement in function at home and with leisure activities related to hip pain  Baseline: 07/02/22: 31; Goal status: INITIAL  2.  Pt will decrease worst hip pain by at least 3 points on the NPRS in order to demonstrate clinically significant reduction in hip pain. Baseline: 07/01/21: worst: 7/10; Goal status: INITIAL  3.  Pt will increase HOOS Jr score by at least 18 points in order demonstrate clinically significant improvement in R hip pain and function      Baseline: 07/02/22: To be completed; 07/09/22: 11/28, Interval Score: 55.99/100 Goal status: INITIAL  4.  Pt will report at least 75% improvement in his hip symptoms in order to return to walking and hiking without notable increase in his pain.  Baseline:  Goal status: INITIAL   PLAN: PT FREQUENCY: 1-2x/week  PT DURATION: 12 weeks  PLANNED INTERVENTIONS: Therapeutic exercises, Therapeutic activity, Neuromuscular re-education, Balance training, Gait training, Patient/Family education,  Self Care, Joint mobilization, Joint manipulation, Vestibular training, Canalith repositioning, Orthotic/Fit training, DME instructions, Dry Needling, Electrical stimulation, Spinal manipulation, Spinal mobilization, Cryotherapy, Moist heat, Taping, Traction, Ultrasound, Ionotophoresis 4mg /ml Dexamethasone, Manual therapy, and Re-evaluation.  PLAN FOR NEXT SESSION: update outcome measures/goals, review and modify HEP as necessary, progress strengthening and normalize gait;  Sharalyn Ink [place] Mailloux  PT, DPT, GCS  Iyla Balzarini, PT 08/21/2022, 12:50 PM

## 2022-08-21 ENCOUNTER — Ambulatory Visit: Payer: Medicare Other | Attending: Orthopedic Surgery

## 2022-08-21 DIAGNOSIS — R262 Difficulty in walking, not elsewhere classified: Secondary | ICD-10-CM | POA: Diagnosis present

## 2022-08-21 DIAGNOSIS — M25551 Pain in right hip: Secondary | ICD-10-CM

## 2022-08-21 DIAGNOSIS — M6281 Muscle weakness (generalized): Secondary | ICD-10-CM

## 2022-08-28 ENCOUNTER — Ambulatory Visit: Payer: Medicare Other

## 2022-08-28 DIAGNOSIS — M25551 Pain in right hip: Secondary | ICD-10-CM

## 2022-08-28 DIAGNOSIS — R262 Difficulty in walking, not elsewhere classified: Secondary | ICD-10-CM

## 2022-08-28 DIAGNOSIS — M6281 Muscle weakness (generalized): Secondary | ICD-10-CM

## 2022-08-28 NOTE — Therapy (Signed)
OUTPATIENT PHYSICAL THERAPY HIP TREATMENT  Patient Name: Chad Gaines MRN: [id number] DOB:October 21, 1945, 77 y.o., male Today's Date: 08/28/2022  END OF SESSION:  PT End of Session - 08/28/22 1020     Visit Number 12    Number of Visits 25    Date for PT Re-Evaluation 09/24/22    Authorization Type Medicare / BCBS 2024  [id number] on [place]  no auth req    Authorization Time Period eval: 07/02/22    PT Start Time 1478    PT Stop Time 0930    PT Time Calculation (min) 48 min    Activity Tolerance Patient tolerated treatment well    Behavior During Therapy WFL for tasks assessed/performed             Past Medical History:  Diagnosis Date   Allergy to alpha-gal    GERD (gastroesophageal reflux disease)    Hypertension    Pinched nerve    some numbness R arm, R ankle   Sleep apnea    CPAP   Past Surgical History:  Procedure Laterality Date   APPENDECTOMY     CATARACT EXTRACTION W/PHACO Left 07/03/2021   Procedure: CATARACT EXTRACTION PHACO AND INTRAOCULAR LENS PLACEMENT (IOC) LEFT VIVITY TORIC 3.59 00:27.8;  Surgeon: Nevada Crane, MD;  Location: [facility];  Service: Ophthalmology;  Laterality: Left;  sleep apnea   CATARACT EXTRACTION W/PHACO Right 07/17/2021   Procedure: CATARACT EXTRACTION PHACO AND INTRAOCULAR LENS PLACEMENT (IOC) RIGHT VIVITY TORIC 4.20 00:33.2;  Surgeon: Nevada Crane, MD;  Location: [facility];  Service: Ophthalmology;  Laterality: Right;  sleep apnea   NASAL SEPTUM SURGERY     Patient Active Problem List   Diagnosis Date Noted   Difficulty in walking, not elsewhere classified 07/18/2022   Adult hypothyroidism 12/25/2014   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   Apnea, sleep 12/25/2014   Essential (primary) hypertension 08/05/2014   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HCC) 03/08/2014   Chronic lymphocytic leukemia (HCC) 03/08/2014   PCP: Bethann Punches MD  REFERRING PROVIDER: Currie Paris MD  REFERRING DIAG:  Diagnosis   M16.11 (ICD-10-CM) - Unilateral primary osteoarthritis, right hip   RATIONALE FOR EVALUATION AND TREATMENT: Rehabilitation  THERAPY DIAG: Pain in right hip  Muscle weakness (generalized)  Difficulty in walking, not elsewhere classified  ONSET DATE: Multiple years  FOLLOW-UP APPT SCHEDULED WITH REFERRING PROVIDER: Yes   FROM INITIAL EVALUATION SUBJECTIVE:                                                                                                                                                                                         SUBJECTIVE STATEMENT:  R hip pain  PERTINENT HISTORY:  Pt underwent R posterior approach THR on 06/12/22 without reported post-op complications. He was discharged on baby ASA for VTE and has been ambulating with a front wheeled walker since the surgery. He has tried using a rollator and notes that it is easier to use for walking however reports that it causes him to lean forward excessively. He has been off Tramadol for the last week and is using Tylenol to manage the pain. He reports that his sleep has been very disrupted since the surgery. He is also complaining of diarrhea since the surgery as well as mild DOE with activity. No falls reported. No chills, fevers, night sweats. No erythema, drainage, or LE swelling since the surgery. His post-op follow-up went well and staples/sutures were removed with instructions to follow-up in 4-6 weeks. Pt advised to wait at least 2 weeks for therapy after surgery and was given HEP by orthopedics which he is performing including quad sets, glut sets, ankle pumps, mini squats, and standing hip abduction.  History from PT evaluation 03/29/22: Pt reports R hip pain for multiple years with progressive worsening. Pain was initially in the anterior groin and posterior hip however the anterior hip pain has mostly resolved at this time with medication. He is now limping and walking with a hiking pole. Pain worsens throughout the  day. Pt reports progressive RLE weakness however he has not had any falls. Posterior hip pain has been nonresponsive to anti-inflammatories, oral steroids, gabapentin, and cortisone injections. Plain x-ray showed L5-S1 disc narrowing. Lumbar MRI ordered. "It has started to define my life." He used to like to walk and hike but hasn't been able to lately because of the pain. History of cervical pain and RUE radiculopathy.    PAIN:    Pain Intensity: Present: 0/10, Best: 0/10, Worst: 7/10 Pain location: Anterior and posterior R hip (mostly posterior) Pain Quality: sharp with transitions, occasional jabbing pain  Radiating: Yes, posterolateral RLE into the thigh but no longer down to the foot/ankle Numbness/Tingling: Yes, "prickly" in R thigh; Focal Weakness: Yes, RLE weakness since surgery related to pain; Aggravating factors: quick transitions, walking, in and out of bed; Relieving factors: History of prior back injury, pain, surgery, or therapy: No Dominant hand: right Imaging: Yes, see history Red flags: Positive for history of cancer (CLL), diarrhea since surgery, negative for bowel/bladder changes, saddle paresthesia, h/o spinal tumors, h/o compression fx, h/o abdominal aneurysm, chills/fever, night sweats, nausea, vomiting;  PRECAUTIONS: Posterior hip  WEIGHT BEARING RESTRICTIONS: No  FALLS: Has patient fallen in last 6 months? 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   Hobbies: Reading, walking, hiking, pastoral care;  Patient Goals: Pt would like to decrease his pain and be able to return to walking and hiking for leisure   OBJECTIVE: BP: 120/68 mmHg, HR: 68,  SpO2: 98%;  Patient Surveys  FOTO 38, predicted improvement to 56 HOOS JR: to be completed;  Cognition Patient is oriented to person, place, and time.  Recent memory is intact.  Remote memory is intact.  Attention span and concentration are intact.  Expressive speech is intact.  Patient's fund of knowledge is within normal limits for educational  level.    Gross Musculoskeletal Assessment Tremor: None Bulk: Normal Tone: Normal Incision is clean and dry and appears to be healing well. No erythema, edema, discharge, or ecchymosis noted around incision  GAIT: Antalgic gait on the RLE. Pt ambulates with combination of step-to and partial step through pattern with front wheeled walker. When practicing with rollator his gait is more normalized with step-through pattern.   Posture: Lumbar lordosis: WNL Iliac crest height: Equal bilaterally Lumbar lateral shift: Negative  AROM Deferred at this time. Time spent instructing pt in posterior hip precautions;  LE MMT: MMT (out of 5) Right  Left   Hip flexion 4* 5  Hip extension    Hip abduction (seated) 4+* 5  Hip adduction (seated) 4+* 5  Hip internal rotation (testing at neural) 5* 5  Hip external rotation 5* 5  Knee flexion (seated) 4+* 5  Knee extension 5 5  Ankle dorsiflexion 5 5  Ankle plantarflexion Strong Strong  Ankle inversion    Ankle eversion    (* = pain; Blank rows = not tested)  Sensation WNL bilaterally L5-S1. Proprioception, stereognosis, and hot/cold testing deferred on this date.  Functional Outcome Measures  Results Comments  BERG    DGI    FGA    TUG  28.3 seconds Increased fall risk, decreased function;  5TSTS     6 Minute Walk Test    10 Meter Gait Speed Self-selected: 17.5s = 0.57 m/s; Fastest: 9.4s = 1.06 m/s Self-selected gait speed below normative values for community ambulation;  (Blank rows = not tested)    TODAY'S TREATMENT   SUBJECTIVE: Pt reports that he is doing well today. He  denies any resting pain upon arrival but continues with stiffness. His R lateral hip pain continues to improve.   PAIN: Denies resting pain, pain increases with walking especially stiffness after sitting   Ther-ex  NuStep L2-3 warm-up x 5 minutes followed by L6-8 BLE x 5 minutes for BLE strengthening with therapist monitoring fatigue and adjusting resistance and obtaining interval history (5 minutes unbilled);  Walking lunges in // bars with BUE support x multiple laps; Sit to stand from regular height chair with 8# overhead med ball press 2 x 10; Resisted side stepping with green tband around ankles in // bars x multiple laps; Standing hip strengthening with 7.5# ankle weights (AW): Hip flexion marches x 20 BLE; HS curls x 20 BLE; Hip abduction x 20 BLE; Hip extension x 20 BLE; Seated LAQ with 7.5# AW x 20 BLE; Seated clams with manual resistance from therapist x 10 BLE; Seated adductor squeeze with manual resistance from therapist x 10 BLE; Standing heel raises with BUE support x 20;   Not performed: 6" forward BOSU step-ups with BUE support leading with RLE up x 10; BOSU R lateral lunges x 15; Airex balance beam tandem balance with RLE in back x 30s; Airex balance beam side stepping 6' x multiple lengths; Nautilus resisted gait 50# forward, backwards, R lateral, and L lateral x 5 each; 6" L lateral heel taps to 2" airex pad x 10; Total Gym (TG) Level 22 (L22) double leg squats x 10, added 2, 6# dumbbells (DB) x 10; TG L22 double leg heel raises with 2, 6# DB 2 x 10; TG L22 R single leg squats 2 x 10; Supine R SLR 2 x 10; Hooklying clams with manual resistance from therapist 2 x 10; Hooklying adductor squeezes with manual resistance from therapist 2 x 10; Hooklying bridges x 10;  Hooklying bridge marches x 10 on each side; Sidelying R hip abduction 2 x 10;   PATIENT EDUCATION:  Education details: Pt educated throughout session about proper posture and technique with  exercises. Improved exercise technique, movement at target joints, use of target muscles after min to mod verbal, visual, tactile cues.  Person educated: Patient Education method: Explanation  Education comprehension: verbalized understanding    HOME EXERCISE PROGRAM:  Access Code: [email or web address] URL: [email or web address] Date: 07/24/2022 Prepared by: Ria Comment  Exercises - Mini Squat with Counter Support  - 1 x daily - 7 x weekly - 2 sets - 10 reps - Standing Knee Flexion with Counter Support (Mirrored)  - 1 x daily - 7 x weekly - 2 sets - 10 reps - Standing Hip Abduction with Counter Support (Mirrored)  - 1 x daily - 7 x weekly - 2 sets - 10 reps - Heel Raises with Counter Support  - 1 x daily - 7 x weekly - 2 sets - 10 reps - Standing Hip Extension with Counter Support (Mirrored)  - 1 x daily - 7 x weekly - 2 sets - 10 reps - Single Leg Stance (Mirrored)  - 1 x daily - 7 x weekly - 3 reps - 30s hold  Patient Education - THA Posterior Precautions Handout  Green, blue, and gray tbands;    ASSESSMENT:  CLINICAL IMPRESSION: Pt demonstrates excellent motivation during session today. Progressed strengthening during session including increase in reps of each exercises. Pt is able to complete all exercises today without notable increase in pain. Plan to update outcome measures/goals at next session.Therapy frequency will be maintained at 1x/wk with focus on independent exercise but will consider possible discharge or decrease in frequency at future session.  Pt encouraged to follow-up as scheduled. He is making excellent progress since surgery and will benefit from skilled PT services in order to improve gait, strength, and pain in order to improve function at home and with leisure activities such as hiking.  OBJECTIVE IMPAIRMENTS: Abnormal gait, decreased strength, and pain.   ACTIVITY LIMITATIONS: bending, sitting, squatting, and transfers  PARTICIPATION LIMITATIONS:  cleaning, driving, shopping, and community activity  PERSONAL FACTORS: 3+ comorbidities: CLL, OSA, OA, and neuropathy  are also affecting patient's functional outcome.   REHAB POTENTIAL: Excellent  CLINICAL DECISION MAKING: Evolving/moderate complexity  EVALUATION COMPLEXITY: Moderate   GOALS: Goals reviewed with patient? No  SHORT TERM GOALS: Target date: 08/13/2022   Pt will be independent with HEP in order to improve strength and decrease hip pain to improve pain-free function at home. Baseline:  Goal status: INITIAL   LONG TERM GOALS: Target date: 09/24/2022   Pt will increase FOTO to at least 58 to demonstrate significant improvement in function at home and with leisure activities related to hip pain  Baseline: 07/02/22: 31; Goal status: INITIAL  2.  Pt will decrease worst hip pain by at least 3 points on the NPRS in order to demonstrate clinically significant reduction in hip pain. Baseline: 07/01/21: worst: 7/10; Goal status: INITIAL  3.  Pt will increase HOOS Jr score by at least 18 points in order demonstrate clinically significant improvement in R hip pain and function      Baseline: 07/02/22: To be completed; 07/09/22: 11/28, Interval Score: 55.99/100 Goal status: INITIAL  4.  Pt will report at least 75% improvement in his hip symptoms in order to return to walking and hiking without notable increase in his pain.  Baseline:  Goal status: INITIAL  PLAN: PT FREQUENCY: 1-2x/week  PT DURATION: 12 weeks  PLANNED INTERVENTIONS: Therapeutic exercises, Therapeutic activity, Neuromuscular re-education, Balance training, Gait training, Patient/Family education, Self Care, Joint mobilization, Joint manipulation, Vestibular training, Canalith repositioning, Orthotic/Fit training, DME instructions, Dry Needling, Electrical stimulation, Spinal manipulation, Spinal mobilization, Cryotherapy, Moist heat, Taping, Traction, Ultrasound, Ionotophoresis 4mg /ml Dexamethasone, Manual  therapy, and Re-evaluation.  PLAN FOR NEXT SESSION: update outcome measures/goals, review and modify HEP as necessary, progress strengthening and normalize gait;  Sharalyn Ink Kameko Hukill PT, DPT, G[facility]  Berta Denson, PT 08/28/2022, 1:15 PM

## 2022-08-31 NOTE — Therapy (Signed)
OUTPATIENT PHYSICAL THERAPY HIP TREATMENT  Patient Name: Chad Gaines MRN: [id number] DOB:11/03/1945, 77 y.o., male Today's Date: 09/04/2022  END OF SESSION:  PT End of Session - 09/04/22 0851     Visit Number 13    Number of Visits 25    Date for PT Re-Evaluation 09/24/22    Authorization Type Medicare / BCBS 2024  [id number] on [place]  no auth req    Authorization Time Period eval: 07/02/22    PT Start Time 0848    PT Stop Time 0930    PT Time Calculation (min) 42 min    Activity Tolerance Patient tolerated treatment well    Behavior During Therapy WFL for tasks assessed/performed            Past Medical History:  Diagnosis Date   Allergy to alpha-gal    GERD (gastroesophageal reflux disease)    Hypertension    Pinched nerve    some numbness R arm, R ankle   Sleep apnea    CPAP   Past Surgical History:  Procedure Laterality Date   APPENDECTOMY     CATARACT EXTRACTION W/PHACO Left 07/03/2021   Procedure: CATARACT EXTRACTION PHACO AND INTRAOCULAR LENS PLACEMENT (IOC) LEFT VIVITY TORIC 3.59 00:27.8;  Surgeon: Nevada Crane, MD;  Location: [facility];  Service: Ophthalmology;  Laterality: Left;  sleep apnea   CATARACT EXTRACTION W/PHACO Right 07/17/2021   Procedure: CATARACT EXTRACTION PHACO AND INTRAOCULAR LENS PLACEMENT (IOC) RIGHT VIVITY TORIC 4.20 00:33.2;  Surgeon: Nevada Crane, MD;  Location: [facility];  Service: Ophthalmology;  Laterality: Right;  sleep apnea   NASAL SEPTUM SURGERY     Patient Active Problem List   Diagnosis Date Noted   Difficulty in walking, not elsewhere classified 07/18/2022   Adult hypothyroidism 12/25/2014   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   Apnea, sleep 12/25/2014   Essential (primary) hypertension 08/05/2014   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HCC) 03/08/2014   Chronic lymphocytic leukemia (HCC) 03/08/2014   PCP: Bethann Punches MD  REFERRING PROVIDER: Currie Paris MD  REFERRING DIAG:  Diagnosis   M16.11 (ICD-10-CM) - Unilateral primary osteoarthritis, right hip   RATIONALE FOR EVALUATION AND TREATMENT: Rehabilitation  THERAPY DIAG: Pain in right hip  Muscle weakness (generalized)  ONSET DATE: Multiple years  FOLLOW-UP APPT SCHEDULED WITH REFERRING PROVIDER: Yes   FROM INITIAL EVALUATION SUBJECTIVE:                                                                                                                                                                                         SUBJECTIVE STATEMENT:  R hip pain  PERTINENT HISTORY:  Pt  underwent R posterior approach THR on 06/12/22 without reported post-op complications. He was discharged on baby ASA for VTE and has been ambulating with a front wheeled walker since the surgery. He has tried using a rollator and notes that it is easier to use for walking however reports that it causes him to lean forward excessively. He has been off Tramadol for the last week and is using Tylenol to manage the pain. He reports that his sleep has been very disrupted since the surgery. He is also complaining of diarrhea since the surgery as well as mild DOE with activity. No falls reported. No chills, fevers, night sweats. No erythema, drainage, or LE swelling since the surgery. His post-op follow-up went well and staples/sutures were removed with instructions to follow-up in 4-6 weeks. Pt advised to wait at least 2 weeks for therapy after surgery and was given HEP by orthopedics which he is performing including quad sets, glut sets, ankle pumps, mini squats, and standing hip abduction.  History from PT evaluation 03/29/22: Pt reports R hip pain for multiple years with progressive worsening. Pain was initially in the anterior groin and posterior hip however the anterior hip pain has mostly resolved at this time with medication. He is now limping and walking with a hiking pole. Pain worsens throughout the day. Pt reports progressive RLE weakness however he  has not had any falls. Posterior hip pain has been nonresponsive to anti-inflammatories, oral steroids, gabapentin, and cortisone injections. Plain x-ray showed L5-S1 disc narrowing. Lumbar MRI ordered. "It has started to define my life." He used to like to walk and hike but hasn't been able to lately because of the pain. History of cervical pain and RUE radiculopathy.    PAIN:    Pain Intensity: Present: 0/10, Best: 0/10, Worst: 7/10 Pain location: Anterior and posterior R hip (mostly posterior) Pain Quality: sharp with transitions, occasional jabbing pain  Radiating: Yes, posterolateral RLE into the thigh but no longer down to the foot/ankle Numbness/Tingling: Yes, "prickly" in R thigh; Focal Weakness: Yes, RLE weakness since surgery related to pain; Aggravating factors: quick transitions, walking, in and out of bed; Relieving factors: History of prior back injury, pain, surgery, or therapy: No Dominant hand: right Imaging: Yes, see history Red flags: Positive for history of cancer (CLL), diarrhea since surgery, negative for bowel/bladder changes, saddle paresthesia, h/o spinal tumors, h/o compression fx, h/o abdominal aneurysm, chills/fever, night sweats, nausea, vomiting;  PRECAUTIONS: Posterior hip  WEIGHT BEARING RESTRICTIONS: No  FALLS: Has patient fallen in last 6 months? 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   Hobbies: Reading, walking, hiking, pastoral care;  Patient Goals: Pt would like to decrease his pain and be able to return to walking and hiking for leisure   OBJECTIVE: BP: 120/68 mmHg, HR: 68, SpO2: 98%;  Patient Surveys  FOTO 38, predicted  improvement to 40 HOOS JR: to be completed;  Cognition Patient is oriented to person, place, and time.  Recent memory is intact.  Remote memory is intact.  Attention span and concentration are intact.  Expressive speech is intact.  Patient's fund of knowledge is within normal limits for educational level.    Gross Musculoskeletal Assessment Tremor:  None Bulk: Normal Tone: Normal Incision is clean and dry and appears to be healing well. No erythema, edema, discharge, or ecchymosis noted around incision  GAIT: Antalgic gait on the RLE. Pt ambulates with combination of step-to and partial step through pattern with front wheeled walker. When practicing with rollator his gait is more normalized with step-through pattern.   Posture: Lumbar lordosis: WNL Iliac crest height: Equal bilaterally Lumbar lateral shift: Negative  AROM Deferred at this time. Time spent instructing pt in posterior hip precautions;  LE MMT: MMT (out of 5) Right  Left   Hip flexion 4* 5  Hip extension    Hip abduction (seated) 4+* 5  Hip adduction (seated) 4+* 5  Hip internal rotation (testing at neural) 5* 5  Hip external rotation 5* 5  Knee flexion (seated) 4+* 5  Knee extension 5 5  Ankle dorsiflexion 5 5  Ankle plantarflexion Strong Strong  Ankle inversion    Ankle eversion    (* = pain; Blank rows = not tested)  Sensation WNL bilaterally L5-S1. Proprioception, stereognosis, and hot/cold testing deferred on this date.  Functional Outcome Measures  Results Comments  BERG    DGI    FGA    TUG  28.3 seconds Increased fall risk, decreased function;  5TSTS     6 Minute Walk Test    10 Meter Gait Speed Self-selected: 17.5s = 0.57 m/s; Fastest: 9.4s = 1.06 m/s Self-selected gait speed below normative values for community ambulation;  (Blank rows = not tested)    TODAY'S TREATMENT   SUBJECTIVE: Pt reports that he is doing well today. He denies any resting pain upon arrival but continues  with stiffness.    PAIN: Denies resting pain, pain increases with walking especially stiffness after sitting   Ther-ex  NuStep L2-3 warm-up x 5 minutes; 12" forward step-ups leading with RLE x 10; Total Gym (TG) Level 22 (L22) double leg squats x 10,  TG L22 R single leg squats with 2, 5# dumbbells 2 x 10; Rockerboard A/P balance x 30s; Rockerboard A/P weight shifts x 30s; 1/2 foam tandem balance alternating forward LE x 30s each; 1/2 foam tandem balance alternating forward LE ball tosses x 30s each; Airex balance beam side stepping x multiple laps; Airex balance beam tandem gait x multiple laps;   Not performed: 6" forward BOSU step-ups with BUE support leading with RLE up x 10; BOSU R lateral lunges x 15; Airex balance beam tandem balance with RLE in back x 30s; Airex balance beam side stepping 6' x multiple lengths; Nautilus resisted gait 50# forward, backwards, R lateral, and L lateral x 5 each; 6" L lateral heel taps to 2" airex pad x 10; Supine R SLR 2 x 10; Hooklying clams with manual resistance from therapist 2 x 10; Hooklying adductor squeezes with manual resistance from therapist 2 x 10; Hooklying bridges x 10; Hooklying bridge marches x 10 on each side; Sidelying R hip abduction 2 x 10; Walking lunges in // bars with BUE support x multiple laps; Sit to stand from regular height chair with 8# overhead med ball press 2 x 10; Resisted side stepping with green tband around ankles in // bars x multiple laps; Standing hip strengthening with 7.5# ankle weights (AW): Hip flexion marches x 20 BLE; HS curls x 20 BLE; Hip abduction x 20 BLE; Hip extension x 20 BLE;   PATIENT EDUCATION:  Education details: Pt educated throughout session about proper posture and technique with exercises. Improved exercise  technique, movement at target joints, use of target muscles after min to mod verbal, visual, tactile cues.  Person educated: Patient Education method: Explanation   Education comprehension: verbalized understanding    HOME EXERCISE PROGRAM:  Access Code: [email or web address] URL: [email or web address] Date: 07/24/2022 Prepared by: Ria Comment  Exercises - Mini Squat with Counter Support  - 1 x daily - 7 x weekly - 2 sets - 10 reps - Standing Knee Flexion with Counter Support (Mirrored)  - 1 x daily - 7 x weekly - 2 sets - 10 reps - Standing Hip Abduction with Counter Support (Mirrored)  - 1 x daily - 7 x weekly - 2 sets - 10 reps - Heel Raises with Counter Support  - 1 x daily - 7 x weekly - 2 sets - 10 reps - Standing Hip Extension with Counter Support (Mirrored)  - 1 x daily - 7 x weekly - 2 sets - 10 reps - Single Leg Stance (Mirrored)  - 1 x daily - 7 x weekly - 3 reps - 30s hold  Patient Education - THA Posterior Precautions Handout  Green, blue, and gray tbands;    ASSESSMENT:  CLINICAL IMPRESSION: Pt demonstrates excellent motivation during session today. Progressed strengthening during and included balance on unstable rockerboard and Airex balance beam. Pt is able to complete all exercises today without notable increase in pain. Pushed next appointment out multiple weeks in order to wean down on therapy frequency. Plan to update outcome measures/goals at next session. He is making excellent progress since surgery and will benefit from skilled PT services in order to improve gait, strength, and pain in order to improve function at home and with leisure activities such as hiking.  OBJECTIVE IMPAIRMENTS: Abnormal gait, decreased strength, and pain.   ACTIVITY LIMITATIONS: bending, sitting, squatting, and transfers  PARTICIPATION LIMITATIONS: cleaning, driving, shopping, and community activity  PERSONAL FACTORS: 3+ comorbidities: CLL, OSA, OA, and neuropathy  are also affecting patient's functional outcome.   REHAB POTENTIAL: Excellent  CLINICAL DECISION MAKING: Evolving/moderate complexity  EVALUATION COMPLEXITY:  Moderate   GOALS: Goals reviewed with patient? No  SHORT TERM GOALS: Target date: 08/13/2022   Pt will be independent with HEP in order to improve strength and decrease hip pain to improve pain-free function at home. Baseline:  Goal status: INITIAL   LONG TERM GOALS: Target date: 09/24/2022   Pt will increase FOTO to at least 58 to demonstrate significant improvement in function at home and with leisure activities related to hip pain  Baseline: 07/02/22: 31; Goal status: INITIAL  2.  Pt will decrease worst hip pain by at least 3 points on the NPRS in order to demonstrate clinically significant reduction in hip pain. Baseline: 07/01/21: worst: 7/10; Goal status: INITIAL  3.  Pt will increase HOOS Jr score by at least 18 points in order demonstrate clinically significant improvement in R hip pain and function      Baseline: 07/02/22: To be completed; 07/09/22: 11/28, Interval Score: 55.99/100 Goal status: INITIAL  4.  Pt will report at least 75% improvement in his hip symptoms in order to return to walking and hiking without notable increase in his pain.  Baseline:  Goal status: INITIAL   PLAN: PT FREQUENCY: 1-2x/week  PT DURATION: 12 weeks  PLANNED INTERVENTIONS: Therapeutic exercises, Therapeutic activity, Neuromuscular re-education, Balance training, Gait training, Patient/Family education, Self Care, Joint mobilization, Joint manipulation, Vestibular training, Canalith repositioning, Orthotic/Fit training, DME instructions, Dry Needling, Electrical stimulation, Spinal manipulation, Spinal mobilization, Cryotherapy, Moist heat,  Taping, Traction, Ultrasound, Ionotophoresis 4mg /ml Dexamethasone, Manual therapy, and Re-evaluation.  PLAN FOR NEXT SESSION: update outcome measures/goals, review and modify HEP as necessary, progress strengthening and normalize gait;  Sharalyn Ink Cartier Washko PT, DPT, GCS  Kadie Balestrieri, PT 09/04/2022, 2:35 PM

## 2022-09-04 ENCOUNTER — Ambulatory Visit: Payer: Medicare Other

## 2022-09-04 DIAGNOSIS — M6281 Muscle weakness (generalized): Secondary | ICD-10-CM

## 2022-09-04 DIAGNOSIS — M25551 Pain in right hip: Secondary | ICD-10-CM | POA: Diagnosis not present

## 2022-10-03 NOTE — Therapy (Signed)
OUTPATIENT PHYSICAL THERAPY HIP TREATMENT/RECERTIFICATION  Patient Name: Chad Gaines MRN: [id number] DOB:1945/09/19, 77 y.o., male Today's Date: 10/04/2022  END OF SESSION:  PT End of Session - 10/04/22 0844     Visit Number 14    Number of Visits 41    Date for PT Re-Evaluation 11/29/22    Authorization Type Medicare / BCBS 2024  [id number] on [place]  no auth req    Authorization Time Period eval: 07/02/22    PT Start Time 0845    PT Stop Time 0940    PT Time Calculation (min) 55 min    Activity Tolerance Patient tolerated treatment well    Behavior During Therapy WFL for tasks assessed/performed            Past Medical History:  Diagnosis Date   Allergy to alpha-gal    GERD (gastroesophageal reflux disease)    Hypertension    Pinched nerve    some numbness R arm, R ankle   Sleep apnea    CPAP   Past Surgical History:  Procedure Laterality Date   APPENDECTOMY     CATARACT EXTRACTION W/PHACO Left 07/03/2021   Procedure: CATARACT EXTRACTION PHACO AND INTRAOCULAR LENS PLACEMENT (IOC) LEFT VIVITY TORIC 3.59 00:27.8;  Surgeon: Nevada Crane, MD;  Location: [facility];  Service: Ophthalmology;  Laterality: Left;  sleep apnea   CATARACT EXTRACTION W/PHACO Right 07/17/2021   Procedure: CATARACT EXTRACTION PHACO AND INTRAOCULAR LENS PLACEMENT (IOC) RIGHT VIVITY TORIC 4.20 00:33.2;  Surgeon: Nevada Crane, MD;  Location: [facility];  Service: Ophthalmology;  Laterality: Right;  sleep apnea   NASAL SEPTUM SURGERY     Patient Active Problem List   Diagnosis Date Noted   Difficulty in walking, not elsewhere classified 07/18/2022   Adult hypothyroidism 12/25/2014   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   Apnea, sleep 12/25/2014   Essential (primary) hypertension 08/05/2014   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HCC) 03/08/2014   Chronic lymphocytic leukemia (HCC) 03/08/2014   PCP: Bethann Punches MD  REFERRING PROVIDER: Currie Paris MD  REFERRING DIAG:   Diagnosis  M16.11 (ICD-10-CM) - Unilateral primary osteoarthritis, right hip   RATIONALE FOR EVALUATION AND TREATMENT: Rehabilitation  THERAPY DIAG: Pain in right hip  Muscle weakness (generalized)  ONSET DATE: Multiple years  FOLLOW-UP APPT SCHEDULED WITH REFERRING PROVIDER: Yes   FROM INITIAL EVALUATION SUBJECTIVE:                                                                                                                                                                                         SUBJECTIVE STATEMENT:  R hip pain  PERTINENT HISTORY:  Pt  underwent R posterior approach THR on 06/12/22 without reported post-op complications. He was discharged on baby ASA for VTE and has been ambulating with a front wheeled walker since the surgery. He has tried using a rollator and notes that it is easier to use for walking however reports that it causes him to lean forward excessively. He has been off Tramadol for the last week and is using Tylenol to manage the pain. He reports that his sleep has been very disrupted since the surgery. He is also complaining of diarrhea since the surgery as well as mild DOE with activity. No falls reported. No chills, fevers, night sweats. No erythema, drainage, or LE swelling since the surgery. His post-op follow-up went well and staples/sutures were removed with instructions to follow-up in 4-6 weeks. Pt advised to wait at least 2 weeks for therapy after surgery and was given HEP by orthopedics which he is performing including quad sets, glut sets, ankle pumps, mini squats, and standing hip abduction.  History from PT evaluation 03/29/22: Pt reports R hip pain for multiple years with progressive worsening. Pain was initially in the anterior groin and posterior hip however the anterior hip pain has mostly resolved at this time with medication. He is now limping and walking with a hiking pole. Pain worsens throughout the day. Pt reports progressive RLE weakness  however he has not had any falls. Posterior hip pain has been nonresponsive to anti-inflammatories, oral steroids, gabapentin, and cortisone injections. Plain x-ray showed L5-S1 disc narrowing. Lumbar MRI ordered. "It has started to define my life." He used to like to walk and hike but hasn't been able to lately because of the pain. History of cervical pain and RUE radiculopathy.    PAIN:    Pain Intensity: Present: 0/10, Best: 0/10, Worst: 7/10 Pain location: Anterior and posterior R hip (mostly posterior) Pain Quality: sharp with transitions, occasional jabbing pain  Radiating: Yes, posterolateral RLE into the thigh but no longer down to the foot/ankle Numbness/Tingling: Yes, "prickly" in R thigh; Focal Weakness: Yes, RLE weakness since surgery related to pain; Aggravating factors: quick transitions, walking, in and out of bed; Relieving factors: History of prior back injury, pain, surgery, or therapy: No Dominant hand: right Imaging: Yes, see history Red flags: Positive for history of cancer (CLL), diarrhea since surgery, negative for bowel/bladder changes, saddle paresthesia, h/o spinal tumors, h/o compression fx, h/o abdominal aneurysm, chills/fever, night sweats, nausea, vomiting;  PRECAUTIONS: Posterior hip  WEIGHT BEARING RESTRICTIONS: No  FALLS: Has patient fallen in last 6 months? 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   Hobbies: Reading, walking, hiking, pastoral care;  Patient Goals: Pt would like to decrease his pain and be able to return to walking and hiking for leisure   OBJECTIVE: BP: 120/68 mmHg, HR: 68, SpO2: 98%;  Patient Surveys  FOTO 38,  predicted improvement to 37 HOOS JR: to be completed;  Cognition Patient is oriented to person, place, and time.  Recent memory is intact.  Remote memory is intact.  Attention span and concentration are intact.  Expressive speech is intact.  Patient's fund of knowledge is within normal limits for educational level.    Gross Musculoskeletal Assessment Tremor:  None Bulk: Normal Tone: Normal Incision is clean and dry and appears to be healing well. No erythema, edema, discharge, or ecchymosis noted around incision  GAIT: Antalgic gait on the RLE. Pt ambulates with combination of step-to and partial step through pattern with front wheeled walker. When practicing with rollator his gait is more normalized with step-through pattern.   Posture: Lumbar lordosis: WNL Iliac crest height: Equal bilaterally Lumbar lateral shift: Negative  AROM Deferred at this time. Time spent instructing pt in posterior hip precautions;  LE MMT: MMT (out of 5) Right  Left   Hip flexion 4* 5  Hip extension    Hip abduction (seated) 4+* 5  Hip adduction (seated) 4+* 5  Hip internal rotation (testing at neural) 5* 5  Hip external rotation 5* 5  Knee flexion (seated) 4+* 5  Knee extension 5 5  Ankle dorsiflexion 5 5  Ankle plantarflexion Strong Strong  Ankle inversion    Ankle eversion    (* = pain; Blank rows = not tested)  Sensation WNL bilaterally L5-S1. Proprioception, stereognosis, and hot/cold testing deferred on this date.  Functional Outcome Measures  Results Comments  BERG    DGI    FGA    TUG  28.3 seconds Increased fall risk, decreased function;  5TSTS     6 Minute Walk Test    10 Meter Gait Speed Self-selected: 17.5s = 0.57 m/s; Fastest: 9.4s = 1.06 m/s Self-selected gait speed below normative values for community ambulation;  (Blank rows = not tested)    TODAY'S TREATMENT   SUBJECTIVE: Pt reports continued stiffness and pain in his R hip. Pain is most notable after first  standing to walk, descending stairs, and attempting longer walking distances. He also reports that he notices DOE when attempting longer walks. He denies any additional changes in his health since the last therapy session.   PAIN: Denies resting pain, pain increases with activity;   Ther-ex  NuStep L2-3 warm-up x 10 minutes for warm-up and during interval history (5 minutes unbilled);  Goals updated with patient: FOTO: 36; HOOS Jr: 11/28 (55.99/100) Worst pain: 5/10 (regularly with walking 2-3/10); % improvement in hip symptoms: 70%;  LE MMT: MMT (out of 5) Right  Left   Hip flexion 4+* 5  Hip extension 3+ 4  Hip abduction 3+ 4  Hip adduction  4 4  Hip internal rotation (testing at neural) 4* 5  Hip external rotation 3+ 5  Knee flexion 5 5  Knee extension 5 5  Ankle dorsiflexion 5 5  Ankle plantarflexion Strong Strong  Ankle inversion    Ankle eversion    (* = pain; Blank rows = not tested)  Hip abduction digital dynamometry: L: 33#, 35#, 34# (34#), R: 20#, 20.5#, 21.5# (20.7#)  Muscle Length Hamstrings: R: Positive for shortening around 60 degrees; L: Positive for shortening around 60 degrees; Ober: R: Negative L: Not examined  Palpation Location Right Left         Lumbar paraspinals 0 0  Quadratus Lumborum    Gluteus Maximus 0 1  Gluteus Medius 0 1  Deep hip external rotators 0 1  PSIS 0 0  Fortin's Area (SIJ) 0 0  Greater Trochanter 0 1  (Blank rows = not tested) Graded on 0-4 scale (0 = no pain, 1 = pain, 2 = pain with wincing/grimacing/flinching, 3 = pain with withdrawal, 4 = unwilling to allow palpation)  Passive Accessory Intervertebral Motion Pt denies reproduction of R hip or back pain with  CPA L1-L5 and UPA bilaterally L1-L5. Generally, hypomobile throughout but no concordant pain reproduction;  Special Tests Lumbar Radiculopathy and Discogenic: Slump (SN 83, -LR 0.32): R: Not examined L: Not examined SLR (SN 92, -LR 0.29): R: Negative L:   Negative Crossed SLR (SP 90): R: Negative L: Negative  Hip: FABER (SN 81): R: Positive for lateral hip pain L: Negative FADIR (SN 94): R: Positive for lateral hip pain L: Negative Hip scour (SN 50): R: Not examined L: Not examined  SIJ:  Thigh Thrust (SN 88, -LR 0.18) : R: Not examined L: Not examined  Functional tests: Stairs: Poor pelvis/R femoral eccentric control with significant weight shift onto L side and Trendelenburg. Pain reported; Forward 6" step down: Inability to control lowering on RLE with hip/knee valgus/IR;   PATIENT EDUCATION:  Education details: Pt educated throughout session about proper posture and technique with exercises. Improved exercise technique, movement at target joints, use of target muscles after min to mod verbal, visual, tactile cues.  Person educated: Patient Education method: Explanation  Education comprehension: verbalized understanding    HOME EXERCISE PROGRAM:  Access Code: [email or web address] URL: [email or web address] Date: 10/04/2022 Prepared by: Ria Comment  Exercises - Mini Squat with Counter Support  - 1 x daily - 7 x weekly - 2 sets - 10 reps - Single Leg Bridge  - 1 x daily - 7 x weekly - 3 sets - 10 reps - Clamshell with Resistance  - 1 x daily - 7 x weekly - 3 sets - 10 reps - Standing Hip Abduction with Counter Support (Mirrored)  - 1 x daily - 7 x weekly - 2 sets - 10 reps  Green, blue, and gray tbands;    ASSESSMENT:  CLINICAL IMPRESSION: Pt returned today for recheck s/p R posterior approach THA. Therapy was paused for the last few weeks to allow for independent progression. Since the last therapy session he has continued with persistent L lateral hip pain which occasionally radiates down the outside of his RLE. This has resulted in limitations in his function at home and with leisure activities. However he does report at least 70% improvement in hip symptoms compared to pre-surgical state. His FOTO improved to 36  however his HOOS Jr. remains unchanged at 55.99%. His worst hip pain has decreased from 7/10 at the initial evaluation to 5/10 today however consistently it remains around 2-3/10 with walking. Observed pt ascend/descend stairs. During descent he reports pain and demonstrates poor pelvis/R femoral eccentric control with significant weight shift onto L side and Trendelenburg. He continues to demonstrate weak and painful R hip abduction with MMT. Digital dynamometry (average of 3 trials) reveals 20.7# of R hip abduction force compared to 34# on the L side. Right single knee to chest, FADIR, and FABER positions are all painful to the lateral aspect of R hip radiating down the outside of the R thigh. Painful palpation to R hip abductors/external rotators. Findings today are consistent with R greater trochanteric pain syndrome likely related to persistent weakness in R hip abductors/external rotators. Joint motion appears good and unweighted exercises are generally not painful. Updated his HEP for more focused strengthening. Plan to resume therapy at 1x/wk for manual interventions and to progress strengthening. He will benefit from skilled PT services in order to improve gait, strength, and pain in order to improve function at home and with leisure activities such as hiking.  OBJECTIVE IMPAIRMENTS: Abnormal gait, decreased strength, and pain.   ACTIVITY LIMITATIONS: bending, sitting, squatting, and transfers  PARTICIPATION LIMITATIONS: cleaning, driving, shopping, and community activity  PERSONAL FACTORS: 3+ comorbidities: CLL, OSA, OA, and neuropathy  are also affecting patient's functional outcome.   REHAB POTENTIAL: Excellent  CLINICAL DECISION MAKING: Evolving/moderate complexity  EVALUATION COMPLEXITY: Moderate   GOALS: Goals reviewed with patient? No  SHORT TERM GOALS: Target date: 08/13/2022   Pt will be independent with HEP in order to improve strength and decrease hip pain to improve  pain-free function at home. Baseline:  Goal status: ACHIEVED   LONG TERM GOALS: Target date: 11/29/2022    Pt will increase FOTO to at least 58 to demonstrate significant improvement in function at home and with leisure activities related to hip pain  Baseline: 07/02/22: 31; 10/04/22: 36; Goal status: PARTIALLY MET  2.  Pt will decrease worst hip pain by at least 3 points on the NPRS in order to demonstrate clinically significant reduction in hip pain. Baseline: 07/01/21: worst: 7/10; 10/04/22: 5/10 (regularly with walking 2-3/10); Goal status: PARTIALLY MET  3.  Pt will increase HOOS Jr score by at least 18 points in order demonstrate clinically significant improvement in R hip pain and function      Baseline: 07/02/22: To be completed; 07/09/22: 11/28, Interval Score: 55.99/100; 10/04/22: 55.99/100 Goal status: ONGOING  4.  Pt will report at least 75% improvement in his hip symptoms in order to return to walking and hiking without notable increase in his pain.  Baseline: 10/04/22: 70% improvement; Goal status: PARTIALLY MET   PLAN: PT FREQUENCY: 1-2x/week  PT DURATION: 12 weeks  PLANNED INTERVENTIONS: Therapeutic exercises, Therapeutic activity, Neuromuscular re-education, Balance training, Gait training, Patient/Family education, Self Care, Joint mobilization, Joint manipulation, Vestibular training, Canalith repositioning, Orthotic/Fit training, DME instructions, Dry Needling, Electrical stimulation, Spinal manipulation, Spinal mobilization, Cryotherapy, Moist heat, Taping, Traction, Ultrasound, Ionotophoresis 4mg /ml Dexamethasone, Manual therapy, and Re-evaluation.  PLAN FOR NEXT SESSION: manual techniques for R lateral/posterior hip, review and modify HEP as necessary, progress strengthening and normalize gait;  Sharalyn Ink Zykira Matlack PT, DPT, GCS  Akeylah Hendel, PT 10/04/2022, 12:44 PM

## 2022-10-04 ENCOUNTER — Ambulatory Visit: Payer: Medicare Other | Attending: Orthopedic Surgery

## 2022-10-04 DIAGNOSIS — M6281 Muscle weakness (generalized): Secondary | ICD-10-CM | POA: Diagnosis present

## 2022-10-04 DIAGNOSIS — M25551 Pain in right hip: Secondary | ICD-10-CM | POA: Insufficient documentation

## 2022-10-09 ENCOUNTER — Ambulatory Visit (INDEPENDENT_AMBULATORY_CARE_PROVIDER_SITE_OTHER): Payer: Medicare Other | Admitting: Urology

## 2022-10-09 ENCOUNTER — Encounter: Payer: Self-pay | Admitting: Urology

## 2022-10-09 VITALS — BP 157/87 | HR 60 | Ht 70.0 in | Wt 211.2 lb

## 2022-10-09 DIAGNOSIS — R3121 Asymptomatic microscopic hematuria: Secondary | ICD-10-CM

## 2022-10-09 MED ORDER — LIDOCAINE HCL URETHRAL/MUCOSAL 2 % EX GEL
1.0000 | Freq: Once | CUTANEOUS | Status: AC
Start: 2022-10-09 — End: 2022-10-09
  Administered 2022-10-09: 1 via URETHRAL

## 2022-10-09 MED ORDER — CEPHALEXIN 250 MG PO CAPS
500.0000 mg | ORAL_CAPSULE | Freq: Once | ORAL | Status: AC
Start: 2022-10-09 — End: 2022-10-09
  Administered 2022-10-09: 500 mg via ORAL

## 2022-10-09 NOTE — Progress Notes (Signed)
Cystoscopy Procedure Note:  Indication: Microscopic hematuria  Keflex given for prophylaxis  After informed consent and discussion of the procedure and its risks, Chad Gaines was positioned and prepped in the standard fashion. Cystoscopy was performed with a flexible cystoscope. The urethra, bladder neck and entire bladder was visualized in a standard fashion. The prostate was moderate in size. The ureteral orifices were visualized in their normal location and orientation.  Mild trabeculations throughout, no suspicious lesions, no abnormalities on retroflexion  Imaging: CT urogram 05/22/2022 benign  Findings: Normal cystoscopy  Assessment and Plan: Follow-up with urology as needed  Legrand Rams, MD 10/09/2022

## 2022-10-09 NOTE — Addendum Note (Signed)
Addended by: Frankey Shown on: 10/09/2022 10:10 AM   Modules accepted: Orders

## 2022-10-24 ENCOUNTER — Ambulatory Visit: Payer: Medicare Other | Attending: Orthopedic Surgery

## 2022-10-24 DIAGNOSIS — M25551 Pain in right hip: Secondary | ICD-10-CM | POA: Insufficient documentation

## 2022-10-24 DIAGNOSIS — M6281 Muscle weakness (generalized): Secondary | ICD-10-CM | POA: Diagnosis present

## 2022-10-24 DIAGNOSIS — R262 Difficulty in walking, not elsewhere classified: Secondary | ICD-10-CM | POA: Insufficient documentation

## 2022-10-24 NOTE — Therapy (Signed)
OUTPATIENT PHYSICAL THERAPY HIP TREATMENT  Patient Name: Chad Gaines MRN: [id number] DOB:01/23/1946, 77 y.o., male Today's Date: 10/24/2022  END OF SESSION:  PT End of Session - 10/24/22 0844     Visit Number 15    Number of Visits 41    Date for PT Re-Evaluation 11/29/22    Authorization Type Medicare / BCBS 2024  [id number] on [place]  no auth req    Authorization Time Period eval: 07/02/22    PT Start Time 0845    PT Stop Time 0930    PT Time Calculation (min) 45 min    Activity Tolerance Patient tolerated treatment well    Behavior During Therapy WFL for tasks assessed/performed            Past Medical History:  Diagnosis Date   Allergy to alpha-gal    GERD (gastroesophageal reflux disease)    Hypertension    Pinched nerve    some numbness R arm, R ankle   Sleep apnea    CPAP   Past Surgical History:  Procedure Laterality Date   APPENDECTOMY     CATARACT EXTRACTION W/PHACO Left 07/03/2021   Procedure: CATARACT EXTRACTION PHACO AND INTRAOCULAR LENS PLACEMENT (IOC) LEFT VIVITY TORIC 3.59 00:27.8;  Surgeon: Nevada Crane, MD;  Location: [facility];  Service: Ophthalmology;  Laterality: Left;  sleep apnea   CATARACT EXTRACTION W/PHACO Right 07/17/2021   Procedure: CATARACT EXTRACTION PHACO AND INTRAOCULAR LENS PLACEMENT (IOC) RIGHT VIVITY TORIC 4.20 00:33.2;  Surgeon: Nevada Crane, MD;  Location: [facility];  Service: Ophthalmology;  Laterality: Right;  sleep apnea   NASAL SEPTUM SURGERY     Patient Active Problem List   Diagnosis Date Noted   Difficulty in walking, not elsewhere classified 07/18/2022   Adult hypothyroidism 12/25/2014   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   Apnea, sleep 12/25/2014   Essential (primary) hypertension 08/05/2014   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HCC) 03/08/2014   Chronic lymphocytic leukemia (HCC) 03/08/2014   PCP: Bethann Punches MD  REFERRING PROVIDER: Currie Paris MD  REFERRING DIAG:  Diagnosis   M16.11 (ICD-10-CM) - Unilateral primary osteoarthritis, right hip   RATIONALE FOR EVALUATION AND TREATMENT: Rehabilitation  THERAPY DIAG: Pain in right hip  Muscle weakness (generalized)  ONSET DATE: Multiple years  FOLLOW-UP APPT SCHEDULED WITH REFERRING PROVIDER: Yes   FROM INITIAL EVALUATION SUBJECTIVE:                                                                                                                                                                                         SUBJECTIVE STATEMENT:  R hip pain  PERTINENT HISTORY:  Pt  underwent R posterior approach THR on 06/12/22 without reported post-op complications. He was discharged on baby ASA for VTE and has been ambulating with a front wheeled walker since the surgery. He has tried using a rollator and notes that it is easier to use for walking however reports that it causes him to lean forward excessively. He has been off Tramadol for the last week and is using Tylenol to manage the pain. He reports that his sleep has been very disrupted since the surgery. He is also complaining of diarrhea since the surgery as well as mild DOE with activity. No falls reported. No chills, fevers, night sweats. No erythema, drainage, or LE swelling since the surgery. His post-op follow-up went well and staples/sutures were removed with instructions to follow-up in 4-6 weeks. Pt advised to wait at least 2 weeks for therapy after surgery and was given HEP by orthopedics which he is performing including quad sets, glut sets, ankle pumps, mini squats, and standing hip abduction.  History from PT evaluation 03/29/22: Pt reports R hip pain for multiple years with progressive worsening. Pain was initially in the anterior groin and posterior hip however the anterior hip pain has mostly resolved at this time with medication. He is now limping and walking with a hiking pole. Pain worsens throughout the day. Pt reports progressive RLE weakness however he  has not had any falls. Posterior hip pain has been nonresponsive to anti-inflammatories, oral steroids, gabapentin, and cortisone injections. Plain x-ray showed L5-S1 disc narrowing. Lumbar MRI ordered. "It has started to define my life." He used to like to walk and hike but hasn't been able to lately because of the pain. History of cervical pain and RUE radiculopathy.    PAIN:    Pain Intensity: Present: 0/10, Best: 0/10, Worst: 7/10 Pain location: Anterior and posterior R hip (mostly posterior) Pain Quality: sharp with transitions, occasional jabbing pain  Radiating: Yes, posterolateral RLE into the thigh but no longer down to the foot/ankle Numbness/Tingling: Yes, "prickly" in R thigh; Focal Weakness: Yes, RLE weakness since surgery related to pain; Aggravating factors: quick transitions, walking, in and out of bed; Relieving factors: History of prior back injury, pain, surgery, or therapy: No Dominant hand: right Imaging: Yes, see history Red flags: Positive for history of cancer (CLL), diarrhea since surgery, negative for bowel/bladder changes, saddle paresthesia, h/o spinal tumors, h/o compression fx, h/o abdominal aneurysm, chills/fever, night sweats, nausea, vomiting;  PRECAUTIONS: Posterior hip  WEIGHT BEARING RESTRICTIONS: No  FALLS: Has patient fallen in last 6 months? 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   Hobbies: Reading, walking, hiking, pastoral care;  Patient Goals: Pt would like to decrease his pain and be able to return to walking and hiking for leisure   OBJECTIVE: BP: 120/68 mmHg, HR: 68, SpO2: 98%;  Patient Surveys  FOTO 38, predicted  improvement to 19 HOOS JR: to be completed;  Cognition Patient is oriented to person, place, and time.  Recent memory is intact.  Remote memory is intact.  Attention span and concentration are intact.  Expressive speech is intact.  Patient's fund of knowledge is within normal limits for educational level.    Gross Musculoskeletal Assessment Tremor:  None Bulk: Normal Tone: Normal Incision is clean and dry and appears to be healing well. No erythema, edema, discharge, or ecchymosis noted around incision  GAIT: Antalgic gait on the RLE. Pt ambulates with combination of step-to and partial step through pattern with front wheeled walker. When practicing with rollator his gait is more normalized with step-through pattern.   Posture: Lumbar lordosis: WNL Iliac crest height: Equal bilaterally Lumbar lateral shift: Negative  AROM Deferred at this time. Time spent instructing pt in posterior hip precautions;  LE MMT: MMT (out of 5) Right  Left   Hip flexion 4* 5  Hip extension    Hip abduction (seated) 4+* 5  Hip adduction (seated) 4+* 5  Hip internal rotation (testing at neural) 5* 5  Hip external rotation 5* 5  Knee flexion (seated) 4+* 5  Knee extension 5 5  Ankle dorsiflexion 5 5  Ankle plantarflexion Strong Strong  Ankle inversion    Ankle eversion    (* = pain; Blank rows = not tested)  Sensation WNL bilaterally L5-S1. Proprioception, stereognosis, and hot/cold testing deferred on this date.  Functional Outcome Measures  Results Comments  BERG    DGI    FGA    TUG  28.3 seconds Increased fall risk, decreased function;  5TSTS     6 Minute Walk Test    10 Meter Gait Speed Self-selected: 17.5s = 0.57 m/s; Fastest: 9.4s = 1.06 m/s Self-selected gait speed below normative values for community ambulation;  (Blank rows = not tested)  Following data from re-assessment on 10/04/22: LE MMT: MMT (out of 5) Right  Left   Hip flexion 4+* 5  Hip extension 3+ 4  Hip  abduction 3+ 4  Hip adduction  4 4  Hip internal rotation (testing at neural) 4* 5  Hip external rotation 3+ 5  Knee flexion 5 5  Knee extension 5 5  Ankle dorsiflexion 5 5  Ankle plantarflexion Strong Strong  Ankle inversion    Ankle eversion    (* = pain; Blank rows = not tested)  Hip abduction digital dynamometry: L: 33#, 35#, 34# (34#), R: 20#, 20.5#, 21.5# (20.7#)  Muscle Length Hamstrings: R: Positive for shortening around 60 degrees; L: Positive for shortening around 60 degrees; Ober: R: Negative L: Not examined  Palpation Location Right Left         Lumbar paraspinals 0 0  Quadratus Lumborum    Gluteus Maximus 0 1  Gluteus Medius 0 1  Deep hip external rotators 0 1  PSIS 0 0  Fortin's Area (SIJ) 0 0  Greater Trochanter 0 1  (Blank rows = not tested) Graded on 0-4 scale (0 = no pain, 1 = pain, 2 = pain with wincing/grimacing/flinching, 3 = pain with withdrawal, 4 = unwilling to allow palpation)  Passive Accessory Intervertebral Motion Pt denies reproduction of R hip or back pain with CPA L1-L5 and UPA bilaterally L1-L5. Generally, hypomobile throughout but no concordant pain reproduction;  Special Tests Lumbar Radiculopathy and Discogenic: Slump (SN 83, -LR 0.32): R: Not examined L: Not examined SLR (SN 92, -LR 0.29): R: Negative L:  Negative Crossed SLR (SP 90): R: Negative L: Negative  Hip: FABER (SN 81): R: Positive for lateral hip pain L: Negative FADIR (SN 94): R: Positive for lateral hip pain L: Negative Hip scour (SN 50): R: Not examined L: Not examined  SIJ:  Thigh Thrust (SN 88, -LR 0.18) : R: Not examined L: Not examined  Functional tests: Stairs: Poor  pelvis/R femoral eccentric control with significant weight shift onto L side and Trendelenburg. Pain reported; Forward 6" step down: Inability to control lowering on RLE with hip/knee valgus/IR;    TODAY'S TREATMENT   SUBJECTIVE: Pt reports continued stiffness and pain in his R hip. Pain is  worse today compared to the last therapy session. Most of his pain is in the lateral aspect of the hip and down the outside of the R thigh. He does have some occasional mild anterior groin pain. He has been exercising at his fitness center and notices the most pain/difficulty with R hip abduction. He also has a lot of pain with extended walking and stairs (especially descending). He would like to know if he should follow-up with his orthopedic surgeon. He reports persistent DOE when attempting longer walks. Otherwise pt denies any additional changes in his health since the last therapy session.   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 Improved exercise technique, movement at target joints, use of target muscles after min to mod verbal, visual, tactile cues. Greater trochanteric pain syndrome/glut med tendinopathy/ITB syndrome, HEP modifications Person educated: Patient Education method: Explanation, Demonstration, Verbal cues, Handouts, and video   Education comprehension: verbalized understanding and returned demonstration     HOME EXERCISE PROGRAM:  Access Code: [email or web address] URL: [email or web address] Date: 10/24/2022 Prepared by: Ria Comment  Exercises - Mini Squat with Counter Support  - 1 x daily - 7 x weekly - 2 sets - 10 reps - Single Leg Bridge  - 1 x daily - 7 x weekly - 2 sets - 10 reps - Hooklying Isometric Clamshell  - 1 x daily - 7 x weekly - 2 sets - 10 reps - 10s on/10s off hold  Patient Education - Gluteus Medius Tendinopathy - Iliotibial Band Syndrome  Gluteus medius tendinopathy positioning and activity modification; Green, blue, and gray tbands;    ASSESSMENT:  CLINICAL IMPRESSION: Pt reports continued stiffness and pain in his R hip. Pain is worse compared to the last therapy session. He is tender to palpation at the R greater trochanter, especially superiorly along his gluteus medius muscle, TFL, and down his R IT band. 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 Pt encouraged to schedule a follow-up with his orthopedic surgeon out of an abundance of caution and for possible medication management. Regressed HEP to isometric hip abductor/ER strengthening. Performed soft tissue mobilization to lateral hip musculature during session. Pt encouraged to follow-up as scheduled. Plan to continue therapy 1x/wk. He will benefit from skilled PT services in order to improve gait, strength, and pain in order to  improve function at home and with leisure activities such as hiking.  OBJECTIVE IMPAIRMENTS: Abnormal gait, decreased strength, and pain.   ACTIVITY LIMITATIONS: bending, sitting, squatting, and transfers  PARTICIPATION LIMITATIONS: cleaning, driving, shopping, and community activity  PERSONAL FACTORS: 3+ comorbidities: CLL, OSA, OA, and neuropathy  are also affecting patient's functional outcome.   REHAB POTENTIAL: Excellent  CLINICAL  DECISION MAKING: Evolving/moderate complexity  EVALUATION COMPLEXITY: Moderate   GOALS: Goals reviewed with patient? No  SHORT TERM GOALS: Target date: 08/13/2022   Pt will be independent with HEP in order to improve strength and decrease hip pain to improve pain-free function at home. Baseline:  Goal status: ACHIEVED   LONG TERM GOALS: Target date: 11/29/2022    Pt will increase FOTO to at least 58 to demonstrate significant improvement in function at home and with leisure activities related to hip pain  Baseline: 07/02/22: 31; 10/04/22: 36; Goal status: PARTIALLY MET  2.  Pt will decrease worst hip pain by at least 3 points on the NPRS in order to demonstrate clinically significant reduction in hip pain. Baseline: 07/01/21: worst: 7/10; 10/04/22: 5/10 (regularly with walking 2-3/10); Goal status: PARTIALLY MET  3.  Pt will increase HOOS Jr score by at least 18 points in order demonstrate clinically significant improvement in R hip pain and function      Baseline: 07/02/22: To be completed; 07/09/22: 11/28, Interval Score: 55.99/100; 10/04/22: 55.99/100 Goal status: ONGOING  4.  Pt will report at least 75% improvement in his hip symptoms in order to return to walking and hiking without notable increase in his pain.  Baseline: 10/04/22: 70% improvement; Goal status: PARTIALLY MET   PLAN: PT FREQUENCY: 1-2x/week  PT DURATION: 12 weeks  PLANNED INTERVENTIONS: Therapeutic exercises, Therapeutic activity, Neuromuscular re-education, Balance training, Gait training, Patient/Family education, Self Care, Joint mobilization, Joint manipulation, Vestibular training, Canalith repositioning, Orthotic/Fit training, DME instructions, Dry Needling, Electrical stimulation, Spinal manipulation, Spinal mobilization, Cryotherapy, Moist heat, Taping, Traction, Ultrasound, Ionotophoresis 4mg /ml Dexamethasone, Manual therapy, and Re-evaluation.  PLAN FOR NEXT SESSION: manual techniques for R  lateral/posterior hip, review and modify HEP as necessary, progress strengthening and normalize gait;  Sharalyn Ink Naythan Douthit PT, DPT, GCS  Chad Gaines, PT 10/24/2022, 2:14 PM

## 2022-10-31 ENCOUNTER — Ambulatory Visit: Payer: Medicare Other

## 2022-10-31 DIAGNOSIS — M25551 Pain in right hip: Secondary | ICD-10-CM

## 2022-10-31 DIAGNOSIS — M6281 Muscle weakness (generalized): Secondary | ICD-10-CM

## 2022-10-31 DIAGNOSIS — R262 Difficulty in walking, not elsewhere classified: Secondary | ICD-10-CM

## 2022-10-31 NOTE — Therapy (Signed)
OUTPATIENT PHYSICAL THERAPY HIP TREATMENT  Patient Name: Ajeet Casasola MRN: [id number] DOB:1946/03/25, 77 y.o., male Today's Date: 10/31/2022  END OF SESSION:  PT End of Session - 10/31/22 0804     Visit Number 17    Number of Visits 41    Date for PT Re-Evaluation 11/29/22    Authorization Type Medicare / BCBS 2024  UU:[id number] on [place]  no auth req    Authorization Time Period eval: 07/02/22    PT Start Time 0800    PT Stop Time 0845    PT Time Calculation (min) 45 min    Activity Tolerance Patient tolerated treatment well    Behavior During Therapy WFL for tasks assessed/performed            Past Medical History:  Diagnosis Date   Allergy to alpha-gal    GERD (gastroesophageal reflux disease)    Hypertension    Pinched nerve    some numbness R arm, R ankle   Sleep apnea    CPAP   Past Surgical History:  Procedure Laterality Date   APPENDECTOMY     CATARACT EXTRACTION W/PHACO Left 07/03/2021   Procedure: CATARACT EXTRACTION PHACO AND INTRAOCULAR LENS PLACEMENT (IOC) LEFT VIVITY TORIC 3.59 00:27.8;  Surgeon: Nevada Crane, MD;  Location: [facility];  Service: Ophthalmology;  Laterality: Left;  sleep apnea   CATARACT EXTRACTION W/PHACO Right 07/17/2021   Procedure: CATARACT EXTRACTION PHACO AND INTRAOCULAR LENS PLACEMENT (IOC) RIGHT VIVITY TORIC 4.20 00:33.2;  Surgeon: Nevada Crane, MD;  Location: [facility];  Service: Ophthalmology;  Laterality: Right;  sleep apnea   NASAL SEPTUM SURGERY     Patient Active Problem List   Diagnosis Date Noted   Difficulty in walking, not elsewhere classified 07/18/2022   Adult hypothyroidism 12/25/2014   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   Apnea, sleep 12/25/2014   Essential (primary) hypertension 08/05/2014   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HCC) 03/08/2014   Chronic lymphocytic leukemia (HCC) 03/08/2014   PCP: Bethann Punches MD  REFERRING PROVIDER: Currie Paris MD  REFERRING DIAG:  Diagnosis   M16.11 (ICD-10-CM) - Unilateral primary osteoarthritis, right hip   RATIONALE FOR EVALUATION AND TREATMENT: Rehabilitation  THERAPY DIAG: Pain in right hip  Muscle weakness (generalized)  Difficulty in walking, not elsewhere classified  ONSET DATE: Multiple years  FOLLOW-UP APPT SCHEDULED WITH REFERRING PROVIDER: Yes   FROM INITIAL EVALUATION SUBJECTIVE:                                                                                                                                                                                         SUBJECTIVE STATEMENT:  R  hip pain  PERTINENT HISTORY:  Pt underwent R posterior approach THR on 06/12/22 without reported post-op complications. He was discharged on baby ASA for VTE and has been ambulating with a front wheeled walker since the surgery. He has tried using a rollator and notes that it is easier to use for walking however reports that it causes him to lean forward excessively. He has been off Tramadol for the last week and is using Tylenol to manage the pain. He reports that his sleep has been very disrupted since the surgery. He is also complaining of diarrhea since the surgery as well as mild DOE with activity. No falls reported. No chills, fevers, night sweats. No erythema, drainage, or LE swelling since the surgery. His post-op follow-up went well and staples/sutures were removed with instructions to follow-up in 4-6 weeks. Pt advised to wait at least 2 weeks for therapy after surgery and was given HEP by orthopedics which he is performing including quad sets, glut sets, ankle pumps, mini squats, and standing hip abduction.  History from PT evaluation 03/29/22: Pt reports R hip pain for multiple years with progressive worsening. Pain was initially in the anterior groin and posterior hip however the anterior hip pain has mostly resolved at this time with medication. He is now limping and walking with a hiking pole. Pain worsens throughout the  day. Pt reports progressive RLE weakness however he has not had any falls. Posterior hip pain has been nonresponsive to anti-inflammatories, oral steroids, gabapentin, and cortisone injections. Plain x-ray showed L5-S1 disc narrowing. Lumbar MRI ordered. "It has started to define my life." He used to like to walk and hike but hasn't been able to lately because of the pain. History of cervical pain and RUE radiculopathy.    PAIN:    Pain Intensity: Present: 0/10, Best: 0/10, Worst: 7/10 Pain location: Anterior and posterior R hip (mostly posterior) Pain Quality: sharp with transitions, occasional jabbing pain  Radiating: Yes, posterolateral RLE into the thigh but no longer down to the foot/ankle Numbness/Tingling: Yes, "prickly" in R thigh; Focal Weakness: Yes, RLE weakness since surgery related to pain; Aggravating factors: quick transitions, walking, in and out of bed; Relieving factors: History of prior back injury, pain, surgery, or therapy: No Dominant hand: right Imaging: Yes, see history Red flags: Positive for history of cancer (CLL), diarrhea since surgery, negative for bowel/bladder changes, saddle paresthesia, h/o spinal tumors, h/o compression fx, h/o abdominal aneurysm, chills/fever, night sweats, nausea, vomiting;  PRECAUTIONS: Posterior hip  WEIGHT BEARING RESTRICTIONS: No  FALLS: Has patient fallen in last 6 months? 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   Hobbies: Reading, walking, hiking, pastoral care;  Patient Goals: Pt would like to decrease his pain and be able to return to walking and hiking for leisure   OBJECTIVE: BP: 120/68 mmHg, HR: 68,  SpO2: 98%;  Patient Surveys  FOTO 38, predicted improvement to 75 HOOS JR: to be completed;  Cognition Patient is oriented to person, place, and time.  Recent memory is intact.  Remote memory is intact.  Attention span and concentration are intact.  Expressive speech is intact.  Patient's fund of knowledge is within normal limits for educational level.  Gross Musculoskeletal Assessment Tremor: None Bulk: Normal Tone: Normal Incision is clean and dry and appears to be healing well. No erythema, edema, discharge, or ecchymosis noted around incision  GAIT: Antalgic gait on the RLE. Pt ambulates with combination of step-to and partial step through pattern with front wheeled walker. When practicing with rollator his gait is more normalized with step-through pattern.   Posture: Lumbar lordosis: WNL Iliac crest height: Equal bilaterally Lumbar lateral shift: Negative  AROM Deferred at this time. Time spent instructing pt in posterior hip precautions;  LE MMT: MMT (out of 5) Right  Left   Hip flexion 4* 5  Hip extension    Hip abduction (seated) 4+* 5  Hip adduction (seated) 4+* 5  Hip internal rotation (testing at neural) 5* 5  Hip external rotation 5* 5  Knee flexion (seated) 4+* 5  Knee extension 5 5  Ankle dorsiflexion 5 5  Ankle plantarflexion Strong Strong  Ankle inversion    Ankle eversion    (* = pain; Blank rows = not tested)  Sensation WNL bilaterally L5-S1. Proprioception, stereognosis, and hot/cold testing deferred on this date.  Functional Outcome Measures  Results Comments  BERG    DGI    FGA    TUG  28.3 seconds Increased fall risk, decreased function;  5TSTS     6 Minute Walk Test    10 Meter Gait Speed Self-selected: 17.5s = 0.57 m/s; Fastest: 9.4s = 1.06 m/s Self-selected gait speed below normative values for community ambulation;  (Blank rows = not tested)  Following data from re-assessment on 10/04/22: LE MMT: MMT (out of 5) Right  Left    Hip flexion 4+* 5  Hip extension 3+ 4  Hip abduction 3+ 4  Hip adduction  4 4  Hip internal rotation (testing at neural) 4* 5  Hip external rotation 3+ 5  Knee flexion 5 5  Knee extension 5 5  Ankle dorsiflexion 5 5  Ankle plantarflexion Strong Strong  Ankle inversion    Ankle eversion    (* = pain; Blank rows = not tested)  Hip abduction digital dynamometry: L: 33#, 35#, 34# (34#), R: 20#, 20.5#, 21.5# (20.7#)  Muscle Length Hamstrings: R: Positive for shortening around 60 degrees; L: Positive for shortening around 60 degrees; Ober: R: Negative L: Not examined  Palpation Location Right Left         Lumbar paraspinals 0 0  Quadratus Lumborum    Gluteus Maximus 0 1  Gluteus Medius 0 1  Deep hip external rotators 0 1  PSIS 0 0  Fortin's Area (SIJ) 0 0  Greater Trochanter 0 1  (Blank rows = not tested) Graded on 0-4 scale (0 = no pain, 1 = pain, 2 = pain with wincing/grimacing/flinching, 3 = pain with withdrawal, 4 = unwilling to allow palpation)  Passive Accessory Intervertebral Motion Pt denies reproduction of R hip or back pain with CPA L1-L5 and UPA bilaterally L1-L5. Generally, hypomobile throughout but no concordant pain reproduction;  Special Tests Lumbar Radiculopathy and Discogenic: Slump (SN 83, -LR 0.32): R: Not examined L: Not examined SLR (SN 92, -LR 0.29): R: Negative L:  Negative Crossed SLR (SP 90): R: Negative L: Negative  Hip: FABER (SN 81): R: Positive for lateral hip pain L: Negative FADIR (SN 94): R: Positive for lateral hip pain L: Negative Hip scour (SN 50): R: Not examined L: Not examined  SIJ:  Thigh Thrust (SN 88, -LR 0.18) : R: Not examined L: Not examined  Functional tests: Stairs: Poor pelvis/R femoral eccentric control with significant weight shift onto L side and Trendelenburg. Pain reported; Forward 6" step down: Inability to control lowering on RLE with hip/knee valgus/IR;    TODAY'S TREATMENT   SUBJECTIVE: Pt reports  continued stiffness and pain in his R hip. He has backed off considerably on his walking and exercise as instructed. He has a follow-up with his orthopedic surgeon's office on Friday (appt is with one of the PA's in the clinic). Otherwise pt denies any additional changes in his health since the last therapy session.   PAIN: Denies resting pain, pain increases with activity;   Ther-ex  NuStep L0-4 warm-up x 8 minutes for warm-up and during interval history, no pain reported; L sidelying R hip isometric clam with pillow between knees and manual resistance from therapist, cues for pain-minimal force 10s on/10s off x 10; L sidelying R hip abduction without resistance x 10, mild strain; Hooklying bridges x 10; Hooklying bridge marches x 10 BLE;   Manual Therapy  Extensive STM to R gluteus medius, gluteus maximus, TFL, and ITB with effleurage, trigger point release, and rolling with Hypervolt and Theraband roller; Gentle long axis hip distraction for stretch, no pain reported;   PATIENT EDUCATION:  Education details: Pt educated throughout session about proper posture and technique with exercises. Improved exercise technique, movement at target joints, use of target muscles after min to mod verbal, visual, tactile cues.  Person educated: Patient Education method: Explanation and Verbal cues  Education comprehension: verbalized understanding and returned demonstration    HOME EXERCISE PROGRAM:  Access Code: [email or web address] URL: [email or web address] Date: 10/24/2022 Prepared by: Ria Comment  Exercises - Mini Squat with Counter Support  - 1 x daily - 7 x weekly - 2 sets - 10 reps - Single Leg Bridge  - 1 x daily - 7 x weekly - 2 sets - 10 reps - Hooklying Isometric Clamshell  - 1 x daily - 7 x weekly - 2 sets - 10 reps - 10s on/10s off hold  Patient Education - Gluteus Medius Tendinopathy - Iliotibial Band Syndrome  Gluteus medius tendinopathy positioning and activity  modification; Green, blue, and gray tbands;    ASSESSMENT:  CLINICAL IMPRESSION: Pt reports continued stiffness and pain in his R hip. He has backed off considerably on his exercise/activity over the course of the last week as instructed by therapist. Repeated soft tissue mobilization to lateral hip musculature during session. Also continued with low load/minimal pain isometrics. Pt encouraged to follow-up as scheduled. Plan to continue therapy 1x/wk. He will benefit from skilled PT services in order to improve gait, strength, and pain in order to improve function at home and with leisure activities such as hiking.  OBJECTIVE IMPAIRMENTS: Abnormal gait, decreased strength, and pain.   ACTIVITY LIMITATIONS: bending, sitting, squatting, and transfers  PARTICIPATION LIMITATIONS: cleaning, driving, shopping, and community activity  PERSONAL FACTORS: 3+ comorbidities: CLL, OSA, OA, and neuropathy  are also affecting patient's functional outcome.   REHAB POTENTIAL: Excellent  CLINICAL DECISION MAKING: Evolving/moderate complexity  EVALUATION COMPLEXITY: Moderate   GOALS: Goals reviewed with patient? No  SHORT TERM GOALS: Target date: 08/13/2022   Pt will be independent with HEP in order to improve strength and decrease hip pain to improve pain-free function at home. Baseline:  Goal status: ACHIEVED   LONG TERM GOALS: Target date: 11/29/2022    Pt will increase FOTO to at least 58 to demonstrate significant improvement in function at home and with leisure activities  related to hip pain  Baseline: 07/02/22: 31; 10/04/22: 36; Goal status: PARTIALLY MET  2.  Pt will decrease worst hip pain by at least 3 points on the NPRS in order to demonstrate clinically significant reduction in hip pain. Baseline: 07/01/21: worst: 7/10; 10/04/22: 5/10 (regularly with walking 2-3/10); Goal status: PARTIALLY MET  3.  Pt will increase HOOS Jr score by at least 18 points in order demonstrate clinically  significant improvement in R hip pain and function      Baseline: 07/02/22: To be completed; 07/09/22: 11/28, Interval Score: 55.99/100; 10/04/22: 55.99/100 Goal status: ONGOING  4.  Pt will report at least 75% improvement in his hip symptoms in order to return to walking and hiking without notable increase in his pain.  Baseline: 10/04/22: 70% improvement; Goal status: PARTIALLY MET   PLAN: PT FREQUENCY: 1-2x/week  PT DURATION: 12 weeks  PLANNED INTERVENTIONS: Therapeutic exercises, Therapeutic activity, Neuromuscular re-education, Balance training, Gait training, Patient/Family education, Self Care, Joint mobilization, Joint manipulation, Vestibular training, Canalith repositioning, Orthotic/Fit training, DME instructions, Dry Needling, Electrical stimulation, Spinal manipulation, Spinal mobilization, Cryotherapy, Moist heat, Taping, Traction, Ultrasound, Ionotophoresis 4mg /ml Dexamethasone, Manual therapy, and Re-evaluation.  PLAN FOR NEXT SESSION: manual techniques for R lateral/posterior hip, review and modify HEP as necessary, progress strengthening and normalize gait;  Sharalyn Ink Genavie Boettger PT, DPT, GCS  Emanuela Runnion, PT 10/31/2022, 11:09 AM

## 2022-11-05 ENCOUNTER — Ambulatory Visit: Payer: Medicare Other

## 2022-11-05 DIAGNOSIS — R262 Difficulty in walking, not elsewhere classified: Secondary | ICD-10-CM

## 2022-11-05 DIAGNOSIS — M25551 Pain in right hip: Secondary | ICD-10-CM

## 2022-11-05 DIAGNOSIS — M6281 Muscle weakness (generalized): Secondary | ICD-10-CM

## 2022-11-05 NOTE — Therapy (Signed)
OUTPATIENT PHYSICAL THERAPY HIP TREATMENT  Patient Name: Chad Gaines MRN: [id number] DOB:[birth year], 77 y.o., male Today's Date: 11/05/2022  END OF SESSION:  PT End of Session - 11/05/22 1016     Visit Number 18    Number of Visits 41    Date for PT Re-Evaluation 11/29/22    Authorization Type Medicare / BCBS 2024  [id number] on [place]  no auth req    Authorization Time Period eval: 07/02/22    PT Start Time 1017    PT Stop Time 1100    PT Time Calculation (min) 43 min    Activity Tolerance Patient tolerated treatment well    Behavior During Therapy WFL for tasks assessed/performed            Past Medical History:  Diagnosis Date   Allergy to alpha-gal    GERD (gastroesophageal reflux disease)    Hypertension    Pinched nerve    some numbness R arm, R ankle   Sleep apnea    CPAP   Past Surgical History:  Procedure Laterality Date   APPENDECTOMY     CATARACT EXTRACTION W/PHACO Left 07/03/2021   Procedure: CATARACT EXTRACTION PHACO AND INTRAOCULAR LENS PLACEMENT (IOC) LEFT VIVITY TORIC 3.59 00:27.8;  Surgeon: Nevada Crane, MD;  Location: [facility];  Service: Ophthalmology;  Laterality: Left;  sleep apnea   CATARACT EXTRACTION W/PHACO Right 07/17/2021   Procedure: CATARACT EXTRACTION PHACO AND INTRAOCULAR LENS PLACEMENT (IOC) RIGHT VIVITY TORIC 4.20 00:33.2;  Surgeon: Nevada Crane, MD;  Location: [facility];  Service: Ophthalmology;  Laterality: Right;  sleep apnea   NASAL SEPTUM SURGERY     Patient Active Problem List   Diagnosis Date Noted   Difficulty in walking, not elsewhere classified 07/18/2022   Adult hypothyroidism 12/25/2014   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   Apnea, sleep 12/25/2014   Essential (primary) hypertension 08/05/2014   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HCC) 03/08/2014   Chronic lymphocytic leukemia (HCC) 03/08/2014   PCP: Bethann Punches MD  REFERRING PROVIDER: Currie Paris MD  REFERRING DIAG:  Diagnosis   M16.11 (ICD-10-CM) - Unilateral primary osteoarthritis, right hip   RATIONALE FOR EVALUATION AND TREATMENT: Rehabilitation  THERAPY DIAG: Pain in right hip  Muscle weakness (generalized)  Difficulty in walking, not elsewhere classified  ONSET DATE: Multiple years  FOLLOW-UP APPT SCHEDULED WITH REFERRING PROVIDER: Yes   FROM INITIAL EVALUATION SUBJECTIVE:                                                                                                                                                                                         SUBJECTIVE STATEMENT:  R  hip pain  PERTINENT HISTORY:  Pt underwent R posterior approach THR on 06/12/22 without reported post-op complications. He was discharged on baby ASA for VTE and has been ambulating with a front wheeled walker since the surgery. He has tried using a rollator and notes that it is easier to use for walking however reports that it causes him to lean forward excessively. He has been off Tramadol for the last week and is using Tylenol to manage the pain. He reports that his sleep has been very disrupted since the surgery. He is also complaining of diarrhea since the surgery as well as mild DOE with activity. No falls reported. No chills, fevers, night sweats. No erythema, drainage, or LE swelling since the surgery. His post-op follow-up went well and staples/sutures were removed with instructions to follow-up in 4-6 weeks. Pt advised to wait at least 2 weeks for therapy after surgery and was given HEP by orthopedics which he is performing including quad sets, glut sets, ankle pumps, mini squats, and standing hip abduction.  History from PT evaluation 03/29/22: Pt reports R hip pain for multiple years with progressive worsening. Pain was initially in the anterior groin and posterior hip however the anterior hip pain has mostly resolved at this time with medication. He is now limping and walking with a hiking pole. Pain worsens throughout the  day. Pt reports progressive RLE weakness however he has not had any falls. Posterior hip pain has been nonresponsive to anti-inflammatories, oral steroids, gabapentin, and cortisone injections. Plain x-ray showed L5-S1 disc narrowing. Lumbar MRI ordered. "It has started to define my life." He used to like to walk and hike but hasn't been able to lately because of the pain. History of cervical pain and RUE radiculopathy.    PAIN:    Pain Intensity: Present: 0/10, Best: 0/10, Worst: 7/10 Pain location: Anterior and posterior R hip (mostly posterior) Pain Quality: sharp with transitions, occasional jabbing pain  Radiating: Yes, posterolateral RLE into the thigh but no longer down to the foot/ankle Numbness/Tingling: Yes, "prickly" in R thigh; Focal Weakness: Yes, RLE weakness since surgery related to pain; Aggravating factors: quick transitions, walking, in and out of bed; Relieving factors: History of prior back injury, pain, surgery, or therapy: No Dominant hand: right Imaging: Yes, see history Red flags: Positive for history of cancer (CLL), diarrhea since surgery, negative for bowel/bladder changes, saddle paresthesia, h/o spinal tumors, h/o compression fx, h/o abdominal aneurysm, chills/fever, night sweats, nausea, vomiting;  PRECAUTIONS: Posterior hip  WEIGHT BEARING RESTRICTIONS: No  FALLS: Has patient fallen in last 6 months? 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   Hobbies: Reading, walking, hiking, pastoral care;  Patient Goals: Pt would like to decrease his pain and be able to return to walking and hiking for leisure   OBJECTIVE: BP: 120/68 mmHg, HR: 68,  SpO2: 98%;  Patient Surveys  FOTO 38, predicted improvement to 72 HOOS JR: to be completed;  Cognition Patient is oriented to person, place, and time.  Recent memory is intact.  Remote memory is intact.  Attention span and concentration are intact.  Expressive speech is intact.  Patient's fund of knowledge is within normal limits for educational level.  Gross Musculoskeletal Assessment Tremor: None Bulk: Normal Tone: Normal Incision is clean and dry and appears to be healing well. No erythema, edema, discharge, or ecchymosis noted around incision  GAIT: Antalgic gait on the RLE. Pt ambulates with combination of step-to and partial step through pattern with front wheeled walker. When practicing with rollator his gait is more normalized with step-through pattern.   Posture: Lumbar lordosis: WNL Iliac crest height: Equal bilaterally Lumbar lateral shift: Negative  AROM Deferred at this time. Time spent instructing pt in posterior hip precautions;  LE MMT: MMT (out of 5) Right  Left   Hip flexion 4* 5  Hip extension    Hip abduction (seated) 4+* 5  Hip adduction (seated) 4+* 5  Hip internal rotation (testing at neural) 5* 5  Hip external rotation 5* 5  Knee flexion (seated) 4+* 5  Knee extension 5 5  Ankle dorsiflexion 5 5  Ankle plantarflexion Strong Strong  Ankle inversion    Ankle eversion    (* = pain; Blank rows = not tested)  Sensation WNL bilaterally L5-S1. Proprioception, stereognosis, and hot/cold testing deferred on this date.  Functional Outcome Measures  Results Comments  BERG    DGI    FGA    TUG  28.3 seconds Increased fall risk, decreased function;  5TSTS     6 Minute Walk Test    10 Meter Gait Speed Self-selected: 17.5s = 0.57 m/s; Fastest: 9.4s = 1.06 m/s Self-selected gait speed below normative values for community ambulation;  (Blank rows = not tested)  Following data from re-assessment on 10/04/22: LE MMT: MMT (out of 5) Right  Left    Hip flexion 4+* 5  Hip extension 3+ 4  Hip abduction 3+ 4  Hip adduction  4 4  Hip internal rotation (testing at neural) 4* 5  Hip external rotation 3+ 5  Knee flexion 5 5  Knee extension 5 5  Ankle dorsiflexion 5 5  Ankle plantarflexion Strong Strong  Ankle inversion    Ankle eversion    (* = pain; Blank rows = not tested)  Hip abduction digital dynamometry: L: 33#, 35#, 34# (34#), R: 20#, 20.5#, 21.5# (20.7#)  Muscle Length Hamstrings: R: Positive for shortening around 60 degrees; L: Positive for shortening around 60 degrees; Ober: R: Negative L: Not examined  Palpation Location Right Left         Lumbar paraspinals 0 0  Quadratus Lumborum    Gluteus Maximus 0 1  Gluteus Medius 0 1  Deep hip external rotators 0 1  PSIS 0 0  Fortin's Area (SIJ) 0 0  Greater Trochanter 0 1  (Blank rows = not tested) Graded on 0-4 scale (0 = no pain, 1 = pain, 2 = pain with wincing/grimacing/flinching, 3 = pain with withdrawal, 4 = unwilling to allow palpation)  Passive Accessory Intervertebral Motion Pt denies reproduction of R hip or back pain with CPA L1-L5 and UPA bilaterally L1-L5. Generally, hypomobile throughout but no concordant pain reproduction;  Special Tests Lumbar Radiculopathy and Discogenic: Slump (SN 83, -LR 0.32): R: Not examined L: Not examined SLR (SN 92, -LR 0.29): R: Negative L:  Negative Crossed SLR (SP 90): R: Negative L: Negative  Hip: FABER (SN 81): R: Positive for lateral hip pain L: Negative FADIR (SN 94): R: Positive for lateral hip pain L: Negative Hip scour (SN 50): R: Not examined L: Not examined  SIJ:  Thigh Thrust (SN 88, -LR 0.18) : R: Not examined L: Not examined  Functional tests: Stairs: Poor pelvis/R femoral eccentric control with significant weight shift onto L side and Trendelenburg. Pain reported; Forward 6" step down: Inability to control lowering on RLE with hip/knee valgus/IR;    TODAY'S TREATMENT   SUBJECTIVE: Pt saw the  orthopedic PA at [facility] on Friday. She obtained radiographs which showed stable hardware and good spacing in R hip. She agreed with therapist that pt is having glut med tendinopathy/ITB syndrome and gave him a steroid injection in his lateral R hip. Pt has noticed mild improvement in his pain symptoms since the injection. Otherwise he denies additional changes in his health since the last therapy session.   PAIN: Minimal to no resting pain, pain and stiffness increase with activity;   Ther-ex  NuStep L0-4 warm-up x 10 minutes for warm-up and during interval history, no pain reported but appropriate fatigue in bilateral thighs; L sidelying R hip isometric clam with pillow between knees and manual resistance from therapist, cues to keep pain below 4/10, 10s on/10s off x 10; Supine R hip isometric abduction with resistance from therapist 10s on/10s off x 10; Hooklying bridges x 10; Hooklying bridge marches x 10 BLE, pt reports some muscle strain to posterior R hip; Hooklying adductor ball squeeze x 10; HEP updated and reviewed with patient;   Manual Therapy  Extensive STM to R gluteus medius, gluteus maximus, TFL, and ITB with effleurage, trigger point release, and rolling with Theraband roller; Gentle long axis hip distraction for stretch, no pain reported; Demonstrated ITB self-rolling technique with foam roller against the wall.   PATIENT EDUCATION:  Education details: Pt educated throughout session about proper posture and technique with exercises. Improved exercise technique, movement at target joints, use of target muscles after min to mod verbal, visual, tactile cues.  Person educated: Patient Education method: Explanation and Verbal cues  Education comprehension: verbalized understanding and returned demonstration    HOME EXERCISE PROGRAM:  Access Code: [email or web address] URL: [email or web address] Date: 11/05/2022 Prepared by: Ria Comment  Exercises - Mini Squat with  Counter Support  - 2 x daily - 7 x weekly - 2 sets - 10 reps - Isometric Gluteus Medius at Wall (Mirrored)  - 2 x daily - 7 x weekly - 1 sets - 10 reps - 10s on/10s off hold - Hooklying Isometric Clamshell  - 2 x daily - 7 x weekly - 1 sets - 10 reps - 10s on/10s off hold  Patient Education - Gluteus Medius Tendinopathy - Iliotibial Band Syndrome  Gluteus medius tendinopathy positioning and activity modification; Green, blue, and gray tbands;    ASSESSMENT:  CLINICAL IMPRESSION: Pt reports continued stiffness and pain in his R hip. He has had mild improvement since the injection from orthopedic PA on Friday. Repeated soft tissue mobilization to lateral hip musculature during session which is helpful to reduce pain. Also continued with low load/minimal pain isometrics. Pt encouraged to follow-up as scheduled. Plan to continue therapy 1x/wk. He will benefit from skilled PT services in order to improve gait, strength, and pain in order to improve function at home and with leisure activities such as hiking.  OBJECTIVE IMPAIRMENTS: Abnormal gait, decreased strength, and pain.   ACTIVITY LIMITATIONS: bending, sitting, squatting, and transfers  PARTICIPATION LIMITATIONS: cleaning, driving, shopping, and community activity  PERSONAL FACTORS: 3+ comorbidities: CLL, OSA, OA, and neuropathy  are also affecting patient's functional outcome.   REHAB POTENTIAL: Excellent  CLINICAL DECISION MAKING: Evolving/moderate complexity  EVALUATION COMPLEXITY: Moderate   GOALS: Goals reviewed with patient? No  SHORT TERM GOALS: Target date: 08/13/2022   Pt will be independent with HEP in order to improve strength and decrease hip pain to improve pain-free function at home. Baseline:  Goal status: ACHIEVED   LONG TERM GOALS: Target date: 11/29/2022    Pt will increase FOTO to at least 58 to demonstrate significant improvement in function at home and with leisure activities related to hip pain   Baseline: 07/02/22: 31; 10/04/22: 36; Goal status: PARTIALLY MET  2.  Pt will decrease worst hip pain by at least 3 points on the NPRS in order to demonstrate clinically significant reduction in hip pain. Baseline: 07/01/21: worst: 7/10; 10/04/22: 5/10 (regularly with walking 2-3/10); Goal status: PARTIALLY MET  3.  Pt will increase HOOS Jr score by at least 18 points in order demonstrate clinically significant improvement in R hip pain and function      Baseline: 07/02/22: To be completed; 07/09/22: 11/28, Interval Score: 55.99/100; 10/04/22: 55.99/100 Goal status: ONGOING  4.  Pt will report at least 75% improvement in his hip symptoms in order to return to walking and hiking without notable increase in his pain.  Baseline: 10/04/22: 70% improvement; Goal status: PARTIALLY MET   PLAN: PT FREQUENCY: 1-2x/week  PT DURATION: 12 weeks  PLANNED INTERVENTIONS: Therapeutic exercises, Therapeutic activity, Neuromuscular re-education, Balance training, Gait training, Patient/Family education, Self Care, Joint mobilization, Joint manipulation, Vestibular training, Canalith repositioning, Orthotic/Fit training, DME instructions, Dry Needling, Electrical stimulation, Spinal manipulation, Spinal mobilization, Cryotherapy, Moist heat, Taping, Traction, Ultrasound, Ionotophoresis 4mg /ml Dexamethasone, Manual therapy, and Re-evaluation.  PLAN FOR NEXT SESSION: manual techniques for R lateral/posterior hip, review and modify HEP as necessary, progress strengthening and normalize gait;  Sharalyn Ink Nalani Andreen PT, DPT, GCS  Shaughnessy Gethers, PT 11/05/2022, 1:17 PM

## 2022-11-10 NOTE — Therapy (Signed)
OUTPATIENT PHYSICAL THERAPY HIP TREATMENT  Patient Name: Chad Gaines MRN: [id number] DOB:1946-04-07, 77 y.o., male Today's Date: 11/12/2022  END OF SESSION:  PT End of Session - 11/12/22 1345     Visit Number 19    Number of Visits 41    Date for PT Re-Evaluation 11/29/22    Authorization Type Medicare / BCBS 2024  [id number] on [place]  no auth req    Authorization Time Period eval: 07/02/22    PT Start Time 0800    PT Stop Time 0845    PT Time Calculation (min) 45 min    Activity Tolerance Patient tolerated treatment well    Behavior During Therapy WFL for tasks assessed/performed             Past Medical History:  Diagnosis Date   Allergy to alpha-gal    GERD (gastroesophageal reflux disease)    Hypertension    Pinched nerve    some numbness R arm, R ankle   Sleep apnea    CPAP   Past Surgical History:  Procedure Laterality Date   APPENDECTOMY     CATARACT EXTRACTION W/PHACO Left 07/03/2021   Procedure: CATARACT EXTRACTION PHACO AND INTRAOCULAR LENS PLACEMENT (IOC) LEFT VIVITY TORIC 3.59 00:27.8;  Surgeon: Nevada Crane, MD;  Location: [facility];  Service: Ophthalmology;  Laterality: Left;  sleep apnea   CATARACT EXTRACTION W/PHACO Right 07/17/2021   Procedure: CATARACT EXTRACTION PHACO AND INTRAOCULAR LENS PLACEMENT (IOC) RIGHT VIVITY TORIC 4.20 00:33.2;  Surgeon: Nevada Crane, MD;  Location: [facility];  Service: Ophthalmology;  Laterality: Right;  sleep apnea   NASAL SEPTUM SURGERY     Patient Active Problem List   Diagnosis Date Noted   Difficulty in walking, not elsewhere classified 07/18/2022   Adult hypothyroidism 12/25/2014   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   Apnea, sleep 12/25/2014   Essential (primary) hypertension 08/05/2014   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HCC) 03/08/2014   Chronic lymphocytic leukemia (HCC) 03/08/2014   PCP: Bethann Punches MD  REFERRING PROVIDER: Currie Paris MD  REFERRING DIAG:  Diagnosis   M16.11 (ICD-10-CM) - Unilateral primary osteoarthritis, right hip   RATIONALE FOR EVALUATION AND TREATMENT: Rehabilitation  THERAPY DIAG: Pain in right hip  Muscle weakness (generalized)  ONSET DATE: Multiple years  FOLLOW-UP APPT SCHEDULED WITH REFERRING PROVIDER: Yes   FROM INITIAL EVALUATION SUBJECTIVE:                                                                                                                                                                                         SUBJECTIVE STATEMENT:  R hip pain  PERTINENT HISTORY:  Pt underwent R posterior approach THR on 06/12/22 without reported post-op complications. He was discharged on baby ASA for VTE and has been ambulating with a front wheeled walker since the surgery. He has tried using a rollator and notes that it is easier to use for walking however reports that it causes him to lean forward excessively. He has been off Tramadol for the last week and is using Tylenol to manage the pain. He reports that his sleep has been very disrupted since the surgery. He is also complaining of diarrhea since the surgery as well as mild DOE with activity. No falls reported. No chills, fevers, night sweats. No erythema, drainage, or LE swelling since the surgery. His post-op follow-up went well and staples/sutures were removed with instructions to follow-up in 4-6 weeks. Pt advised to wait at least 2 weeks for therapy after surgery and was given HEP by orthopedics which he is performing including quad sets, glut sets, ankle pumps, mini squats, and standing hip abduction.  History from PT evaluation 03/29/22: Pt reports R hip pain for multiple years with progressive worsening. Pain was initially in the anterior groin and posterior hip however the anterior hip pain has mostly resolved at this time with medication. He is now limping and walking with a hiking pole. Pain worsens throughout the day. Pt reports progressive RLE weakness however he  has not had any falls. Posterior hip pain has been nonresponsive to anti-inflammatories, oral steroids, gabapentin, and cortisone injections. Plain x-ray showed L5-S1 disc narrowing. Lumbar MRI ordered. "It has started to define my life." He used to like to walk and hike but hasn't been able to lately because of the pain. History of cervical pain and RUE radiculopathy.    PAIN:    Pain Intensity: Present: 0/10, Best: 0/10, Worst: 7/10 Pain location: Anterior and posterior R hip (mostly posterior) Pain Quality: sharp with transitions, occasional jabbing pain  Radiating: Yes, posterolateral RLE into the thigh but no longer down to the foot/ankle Numbness/Tingling: Yes, "prickly" in R thigh; Focal Weakness: Yes, RLE weakness since surgery related to pain; Aggravating factors: quick transitions, walking, in and out of bed; Relieving factors: History of prior back injury, pain, surgery, or therapy: No Dominant hand: right Imaging: Yes, see history Red flags: Positive for history of cancer (CLL), diarrhea since surgery, negative for bowel/bladder changes, saddle paresthesia, h/o spinal tumors, h/o compression fx, h/o abdominal aneurysm, chills/fever, night sweats, nausea, vomiting;  PRECAUTIONS: Posterior hip  WEIGHT BEARING RESTRICTIONS: No  FALLS: Has patient fallen in last 6 months? 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   Hobbies: Reading, walking, hiking, pastoral care;  Patient Goals: Pt would like to decrease his pain and be able to return to walking and hiking for leisure   OBJECTIVE: BP: 120/68 mmHg, HR: 68, SpO2: 98%;  Patient Surveys  FOTO 38, predicted  improvement to 52 HOOS JR: to be completed;  Cognition Patient is oriented to person, place, and time.  Recent memory is intact.  Remote memory is intact.  Attention span and concentration are intact.  Expressive speech is intact.  Patient's fund of knowledge is within normal limits for educational level.    Gross Musculoskeletal Assessment  Tremor: None Bulk: Normal Tone: Normal Incision is clean and dry and appears to be healing well. No erythema, edema, discharge, or ecchymosis noted around incision  GAIT: Antalgic gait on the RLE. Pt ambulates with combination of step-to and partial step through pattern with front wheeled walker. When practicing with rollator his gait is more normalized with step-through pattern.   Posture: Lumbar lordosis: WNL Iliac crest height: Equal bilaterally Lumbar lateral shift: Negative  AROM Deferred at this time. Time spent instructing pt in posterior hip precautions;  LE MMT: MMT (out of 5) Right  Left   Hip flexion 4* 5  Hip extension    Hip abduction (seated) 4+* 5  Hip adduction (seated) 4+* 5  Hip internal rotation (testing at neural) 5* 5  Hip external rotation 5* 5  Knee flexion (seated) 4+* 5  Knee extension 5 5  Ankle dorsiflexion 5 5  Ankle plantarflexion Strong Strong  Ankle inversion    Ankle eversion    (* = pain; Blank rows = not tested)  Sensation WNL bilaterally L5-S1. Proprioception, stereognosis, and hot/cold testing deferred on this date.  Functional Outcome Measures  Results Comments  BERG    DGI    FGA    TUG  28.3 seconds Increased fall risk, decreased function;  5TSTS     6 Minute Walk Test    10 Meter Gait Speed Self-selected: 17.5s = 0.57 m/s; Fastest: 9.4s = 1.06 m/s Self-selected gait speed below normative values for community ambulation;  (Blank rows = not tested)  Following data from re-assessment on 10/04/22: LE MMT: MMT (out of 5) Right  Left   Hip flexion 4+* 5  Hip extension 3+ 4  Hip  abduction 3+ 4  Hip adduction  4 4  Hip internal rotation (testing at neural) 4* 5  Hip external rotation 3+ 5  Knee flexion 5 5  Knee extension 5 5  Ankle dorsiflexion 5 5  Ankle plantarflexion Strong Strong  Ankle inversion    Ankle eversion    (* = pain; Blank rows = not tested)  Hip abduction digital dynamometry: L: 33#, 35#, 34# (34#), R: 20#, 20.5#, 21.5# (20.7#)  Muscle Length Hamstrings: R: Positive for shortening around 60 degrees; L: Positive for shortening around 60 degrees; Ober: R: Negative L: Not examined  Palpation Location Right Left         Lumbar paraspinals 0 0  Quadratus Lumborum    Gluteus Maximus 0 1  Gluteus Medius 0 1  Deep hip external rotators 0 1  PSIS 0 0  Fortin's Area (SIJ) 0 0  Greater Trochanter 0 1  (Blank rows = not tested) Graded on 0-4 scale (0 = no pain, 1 = pain, 2 = pain with wincing/grimacing/flinching, 3 = pain with withdrawal, 4 = unwilling to allow palpation)  Passive Accessory Intervertebral Motion Pt denies reproduction of R hip or back pain with CPA L1-L5 and UPA bilaterally L1-L5. Generally, hypomobile throughout but no concordant pain reproduction;  Special Tests Lumbar Radiculopathy and Discogenic: Slump (SN 83, -LR 0.32): R: Not examined L: Not examined SLR (SN 92, -LR 0.29): R: Negative L:  Negative Crossed SLR (SP 90): R: Negative L: Negative  Hip: FABER (SN 81): R: Positive for lateral hip pain L: Negative FADIR (SN 94): R: Positive for lateral hip pain L: Negative Hip scour (SN 50): R: Not examined L: Not examined  SIJ:  Thigh Thrust (SN 88, -LR 0.18) : R: Not examined L: Not examined  Functional tests: Stairs:  Poor pelvis/R femoral eccentric control with significant weight shift onto L side and Trendelenburg. Pain reported; Forward 6" step down: Inability to control lowering on RLE with hip/knee valgus/IR;    TODAY'S TREATMENT   SUBJECTIVE: Pt reports he is doing well today. Pt has continued to notice mild  improvement in his pain symptoms since the injection. He did have an episode over the weekend when he was performing the standing isometric hip abduction exercise and felt like he "hurt" his R hip when he was in R single leg stance. His hip was sore for a considerable time after that but has slowly progressed since then. Otherwise he denies additional changes in his health since the last therapy session. He is asking about any stretches that he can perform for his R hip pain.   PAIN: Minimal to no resting pain, pain increases with activity;   Ther-ex  NuStep L0-2 warm-up x 10 minutes for warm-up and during interval history, no pain reported; L sidelying R hip eccentric clam x 10; Hooklying bridge marches x 10 BLE, pt reports some muscle strain to posterior R hip; HEP updated and reviewed with patient, demonstrated alternating hip abduction isometric positions;;   Manual Therapy  Extensive STM to R gluteus medius, gluteus maximus, TFL, and ITB with effleurage, trigger point release, and rolling with Theraband roller; R glut med/ER stretch (FADIR position) 2 x 45s, extensive conversation regarding posterior precautions from R THR. Pt advised to constantly monitor R hip and avoid any pain, avoid feelings of instability, and never pushing through resistance/pain.    Trigger Point Dry Needling (TDN), unbilled Education performed with patient regarding potential benefit of TDN. Reviewed precautions and risks with patient. Pt provided verbal consent to treatment. In L sidelying TDN performed to R gluteus maximus/medius with 2, 0.30 x 60 single needle placements with local twitch response (LTR). Pistoning technique utilized. Pt reports improvement in pain at end of session;     PATIENT EDUCATION:  Education details: Pt educated throughout session about proper posture and technique with exercises. Improved exercise technique, movement at target joints, use of target muscles after min to mod verbal,  visual, tactile cues.  Person educated: Patient Education method: Explanation and Verbal cues  Education comprehension: verbalized understanding and returned demonstration    HOME EXERCISE PROGRAM:  Access Code: [email or web address] URL: [email or web address] Date: 11/12/2022 Prepared by: Ria Comment  Exercises - Mini Squat with Counter Support  - 2 x daily - 7 x weekly - 1 sets - 10 reps - Hooklying Isometric Clamshell  - 2 x daily - 7 x weekly - 1 sets - 10 reps - 10s on/10s off hold - Marching Bridge  - 2 x daily - 7 x weekly - 1 sets - 10 reps - Seated Isometric Hip Abduction (Mirrored)  - 2 x daily - 7 x weekly - 1 sets - 10 reps - 10s on/10s off hold  Patient Education - Gluteus Medius Tendinopathy - Iliotibial Band Syndrome  Gluteus medius tendinopathy positioning and activity modification; Green, blue, and gray tbands;    ASSESSMENT:  CLINICAL IMPRESSION: Introduced TDN to gluteus medius/maximus during session today with care taken to remain far from surgical site. Also introduced gentle R gluteus medius/ER stretch with extensive conversation regarding posterior precautions from R THR. Pt advised to constantly monitor R hip and avoid any pain, avoid feelings of instability, and never pushing through resistance/pain.  Repeated soft tissue mobilization to lateral hip musculature during session. Progressed to eccentric clams and HEP updated. Pt  encouraged to follow-up as scheduled. Plan to continue therapy 1x/wk. He will benefit from skilled PT services in order to improve gait, strength, and pain in order to improve function at home and with leisure activities such as hiking.  OBJECTIVE IMPAIRMENTS: Abnormal gait, decreased strength, and pain.   ACTIVITY LIMITATIONS: bending, sitting, squatting, and transfers  PARTICIPATION LIMITATIONS: cleaning, driving, shopping, and community activity  PERSONAL FACTORS: 3+ comorbidities: CLL, OSA, OA, and neuropathy  are also  affecting patient's functional outcome.   REHAB POTENTIAL: Excellent  CLINICAL DECISION MAKING: Evolving/moderate complexity  EVALUATION COMPLEXITY: Moderate   GOALS: Goals reviewed with patient? No  SHORT TERM GOALS: Target date: 08/13/2022   Pt will be independent with HEP in order to improve strength and decrease hip pain to improve pain-free function at home. Baseline:  Goal status: ACHIEVED   LONG TERM GOALS: Target date: 11/29/2022    Pt will increase FOTO to at least 58 to demonstrate significant improvement in function at home and with leisure activities related to hip pain  Baseline: 07/02/22: 31; 10/04/22: 36; Goal status: PARTIALLY MET  2.  Pt will decrease worst hip pain by at least 3 points on the NPRS in order to demonstrate clinically significant reduction in hip pain. Baseline: 07/01/21: worst: 7/10; 10/04/22: 5/10 (regularly with walking 2-3/10); Goal status: PARTIALLY MET  3.  Pt will increase HOOS Jr score by at least 18 points in order demonstrate clinically significant improvement in R hip pain and function      Baseline: 07/02/22: To be completed; 07/09/22: 11/28, Interval Score: 55.99/100; 10/04/22: 55.99/100 Goal status: ONGOING  4.  Pt will report at least 75% improvement in his hip symptoms in order to return to walking and hiking without notable increase in his pain.  Baseline: 10/04/22: 70% improvement; Goal status: PARTIALLY MET   PLAN: PT FREQUENCY: 1-2x/week  PT DURATION: 12 weeks  PLANNED INTERVENTIONS: Therapeutic exercises, Therapeutic activity, Neuromuscular re-education, Balance training, Gait training, Patient/Family education, Self Care, Joint mobilization, Joint manipulation, Vestibular training, Canalith repositioning, Orthotic/Fit training, DME instructions, Dry Needling, Electrical stimulation, Spinal manipulation, Spinal mobilization, Cryotherapy, Moist heat, Taping, Traction, Ultrasound, Ionotophoresis 4mg /ml Dexamethasone, Manual therapy,  and Re-evaluation.  PLAN FOR NEXT SESSION: manual techniques for R lateral/posterior hip, review and modify HEP as necessary, progress strengthening and normalize gait;  Sharalyn Ink Adryen Cookson PT, DPT, GCS  Ronnita Paz, PT 11/12/2022, 1:52 PM

## 2022-11-12 ENCOUNTER — Ambulatory Visit: Payer: Medicare Other

## 2022-11-12 DIAGNOSIS — M25551 Pain in right hip: Secondary | ICD-10-CM | POA: Diagnosis not present

## 2022-11-12 DIAGNOSIS — M6281 Muscle weakness (generalized): Secondary | ICD-10-CM

## 2022-11-19 ENCOUNTER — Ambulatory Visit: Payer: Medicare Other | Attending: Orthopedic Surgery

## 2022-11-19 DIAGNOSIS — R262 Difficulty in walking, not elsewhere classified: Secondary | ICD-10-CM | POA: Insufficient documentation

## 2022-11-19 DIAGNOSIS — M25551 Pain in right hip: Secondary | ICD-10-CM | POA: Diagnosis present

## 2022-11-19 DIAGNOSIS — M6281 Muscle weakness (generalized): Secondary | ICD-10-CM | POA: Diagnosis present

## 2022-11-19 NOTE — Therapy (Signed)
OUTPATIENT PHYSICAL THERAPY HIP TREATMENT/PROGRESS NOTE  Dates of reporting period  08/14/22   to   11/19/22   Patient Name: Chad Gaines MRN: [id number] DOB:07-27-1945, 77 y.o., male Today's Date: 11/19/2022  END OF SESSION:  PT End of Session - 11/19/22 0801     Visit Number 20    Number of Visits 41    Date for PT Re-Evaluation 11/29/22    Authorization Type Medicare / BCBS 2024  YQ:[id number] on [place]  no auth req    Authorization Time Period eval: 07/02/22    PT Start Time 0800    PT Stop Time 0850    PT Time Calculation (min) 50 min    Activity Tolerance Patient tolerated treatment well    Behavior During Therapy WFL for tasks assessed/performed             Past Medical History:  Diagnosis Date   Allergy to alpha-gal    GERD (gastroesophageal reflux disease)    Hypertension    Pinched nerve    some numbness R arm, R ankle   Sleep apnea    CPAP   Past Surgical History:  Procedure Laterality Date   APPENDECTOMY     CATARACT EXTRACTION W/PHACO Left 07/03/2021   Procedure: CATARACT EXTRACTION PHACO AND INTRAOCULAR LENS PLACEMENT (IOC) LEFT VIVITY TORIC 3.59 00:27.8;  Surgeon: Nevada Crane, MD;  Location: [facility];  Service: Ophthalmology;  Laterality: Left;  sleep apnea   CATARACT EXTRACTION W/PHACO Right 07/17/2021   Procedure: CATARACT EXTRACTION PHACO AND INTRAOCULAR LENS PLACEMENT (IOC) RIGHT VIVITY TORIC 4.20 00:33.2;  Surgeon: Nevada Crane, MD;  Location: [facility];  Service: Ophthalmology;  Laterality: Right;  sleep apnea   NASAL SEPTUM SURGERY     Patient Active Problem List   Diagnosis Date Noted   Difficulty in walking, not elsewhere classified 07/18/2022   Adult hypothyroidism 12/25/2014   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   Apnea, sleep 12/25/2014   Essential (primary) hypertension 08/05/2014   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HCC) 03/08/2014   Chronic lymphocytic leukemia (HCC) 03/08/2014   PCP: Bethann Punches MD  REFERRING  PROVIDER: Currie Paris MD  REFERRING DIAG:  Diagnosis  M16.11 (ICD-10-CM) - Unilateral primary osteoarthritis, right hip   RATIONALE FOR EVALUATION AND TREATMENT: Rehabilitation  THERAPY DIAG: Pain in right hip  Muscle weakness (generalized)  Difficulty in walking, not elsewhere classified  ONSET DATE: Multiple years  FOLLOW-UP APPT SCHEDULED WITH REFERRING PROVIDER: Yes   FROM INITIAL EVALUATION SUBJECTIVE:  SUBJECTIVE STATEMENT:  R hip pain  PERTINENT HISTORY:  Pt underwent R posterior approach THR on 06/12/22 without reported post-op complications. He was discharged on baby ASA for VTE and has been ambulating with a front wheeled walker since the surgery. He has tried using a rollator and notes that it is easier to use for walking however reports that it causes him to lean forward excessively. He has been off Tramadol for the last week and is using Tylenol to manage the pain. He reports that his sleep has been very disrupted since the surgery. He is also complaining of diarrhea since the surgery as well as mild DOE with activity. No falls reported. No chills, fevers, night sweats. No erythema, drainage, or LE swelling since the surgery. His post-op follow-up went well and staples/sutures were removed with instructions to follow-up in 4-6 weeks. Pt advised to wait at least 2 weeks for therapy after surgery and was given HEP by orthopedics which he is performing including quad sets, glut sets, ankle pumps, mini squats, and standing hip abduction.  History from PT evaluation 03/29/22: Pt reports R hip pain for multiple years with progressive worsening. Pain was initially in the anterior groin and posterior hip however the anterior hip pain has mostly resolved at this time with medication. He is now  limping and walking with a hiking pole. Pain worsens throughout the day. Pt reports progressive RLE weakness however he has not had any falls. Posterior hip pain has been nonresponsive to anti-inflammatories, oral steroids, gabapentin, and cortisone injections. Plain x-ray showed L5-S1 disc narrowing. Lumbar MRI ordered. "It has started to define my life." He used to like to walk and hike but hasn't been able to lately because of the pain. History of cervical pain and RUE radiculopathy.    PAIN:    Pain Intensity: Present: 0/10, Best: 0/10, Worst: 7/10 Pain location: Anterior and posterior R hip (mostly posterior) Pain Quality: sharp with transitions, occasional jabbing pain  Radiating: Yes, posterolateral RLE into the thigh but no longer down to the foot/ankle Numbness/Tingling: Yes, "prickly" in R thigh; Focal Weakness: Yes, RLE weakness since surgery related to pain; Aggravating factors: quick transitions, walking, in and out of bed; Relieving factors: History of prior back injury, pain, surgery, or therapy: No Dominant hand: right Imaging: Yes, see history Red flags: Positive for history of cancer (CLL), diarrhea since surgery, negative for bowel/bladder changes, saddle paresthesia, h/o spinal tumors, h/o compression fx, h/o abdominal aneurysm, chills/fever, night sweats, nausea, vomiting;  PRECAUTIONS: Posterior hip  WEIGHT BEARING RESTRICTIONS: No  FALLS: Has patient fallen in last 6 months? 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   Hobbies: Reading, walking, hiking, pastoral care;  Patient Goals: Pt would like to decrease his pain and be able to return to  walking and hiking for leisure   OBJECTIVE: BP: 120/68 mmHg, HR: 68, SpO2: 98%;  Patient Surveys  FOTO 38, predicted improvement to 49 HOOS JR: to be completed;  Cognition Patient is oriented to person, place, and time.  Recent memory is intact.  Remote memory is intact.  Attention span and concentration are intact.  Expressive speech is intact.  Patient's fund of knowledge is within normal  limits for educational level.    Gross Musculoskeletal Assessment Tremor: None Bulk: Normal Tone: Normal Incision is clean and dry and appears to be healing well. No erythema, edema, discharge, or ecchymosis noted around incision  GAIT: Antalgic gait on the RLE. Pt ambulates with combination of step-to and partial step through pattern with front wheeled walker. When practicing with rollator his gait is more normalized with step-through pattern.   Posture: Lumbar lordosis: WNL Iliac crest height: Equal bilaterally Lumbar lateral shift: Negative  AROM Deferred at this time. Time spent instructing pt in posterior hip precautions;  LE MMT: MMT (out of 5) Right  Left   Hip flexion 4* 5  Hip extension    Hip abduction (seated) 4+* 5  Hip adduction (seated) 4+* 5  Hip internal rotation (testing at neural) 5* 5  Hip external rotation 5* 5  Knee flexion (seated) 4+* 5  Knee extension 5 5  Ankle dorsiflexion 5 5  Ankle plantarflexion Strong Strong  Ankle inversion    Ankle eversion    (* = pain; Blank rows = not tested)  Sensation WNL bilaterally L5-S1. Proprioception, stereognosis, and hot/cold testing deferred on this date.  Functional Outcome Measures  Results Comments  BERG    DGI    FGA    TUG  28.3 seconds Increased fall risk, decreased function;  5TSTS     6 Minute Walk Test    10 Meter Gait Speed Self-selected: 17.5s = 0.57 m/s; Fastest: 9.4s = 1.06 m/s Self-selected gait speed below normative values for community ambulation;  (Blank rows = not tested)  Following  data from re-assessment on 10/04/22: LE MMT: MMT (out of 5) Right  Left   Hip flexion 4+* 5  Hip extension 3+ 4  Hip abduction 3+ 4  Hip adduction  4 4  Hip internal rotation (testing at neural) 4* 5  Hip external rotation 3+ 5  Knee flexion 5 5  Knee extension 5 5  Ankle dorsiflexion 5 5  Ankle plantarflexion Strong Strong  Ankle inversion    Ankle eversion    (* = pain; Blank rows = not tested)  Hip abduction digital dynamometry: L: 33#, 35#, 34# (34#), R: 20#, 20.5#, 21.5# (20.7#)  Muscle Length Hamstrings: R: Positive for shortening around 60 degrees; L: Positive for shortening around 60 degrees; Ober: R: Negative L: Not examined  Palpation Location Right Left         Lumbar paraspinals 0 0  Quadratus Lumborum    Gluteus Maximus 0 1  Gluteus Medius 0 1  Deep hip external rotators 0 1  PSIS 0 0  Fortin's Area (SIJ) 0 0  Greater Trochanter 0 1  (Blank rows = not tested) Graded on 0-4 scale (0 = no pain, 1 = pain, 2 = pain with wincing/grimacing/flinching, 3 = pain with withdrawal, 4 = unwilling to allow palpation)  Passive Accessory Intervertebral Motion Pt denies reproduction of R hip or back pain with CPA L1-L5 and UPA bilaterally L1-L5. Generally, hypomobile throughout but no concordant pain reproduction;  Special Tests Lumbar Radiculopathy and Discogenic: Slump (SN 83, -LR 0.32): R: Not examined L: Not examined SLR (SN 92, -LR 0.29): R: Negative L:  Negative Crossed SLR (SP 90): R: Negative L: Negative  Hip: FABER (SN 81): R: Positive for lateral hip pain L: Negative FADIR (SN 94): R: Positive for lateral hip pain L: Negative Hip scour (SN 50): R: Not examined L: Not examined  SIJ:  Thigh Thrust (SN 88, -LR 0.18) :  R: Not examined L: Not examined  Functional tests: Stairs: Poor pelvis/R femoral eccentric control with significant weight shift onto L side and Trendelenburg. Pain reported; Forward 6" step down: Inability to control lowering on RLE with  hip/knee valgus/IR;    TODAY'S TREATMENT   SUBJECTIVE: Pt reports he is doing well today. Pt reports continued improvement with right hip pain; no soreness following dry needling last session.    PAIN: Minimal to no resting pain, pain increases with activity;   Ther-ex  NuStep L1-4 warm-up x 10 minutes for warm-up and during interval history, no pain reported; Supine R hip eccentric clam 2 x 10; Supine Bridge 2 x 10 with 5 sec hold against manual resistance; Hooklying bridge marches 2 x 10 BLE; Updated outcome measures/goals; No updates to HEP.   Manual Therapy  Extensive STM to R gluteus medius, gluteus maximus, TFL, and ITB with effleurage, trigger point release, and rolling with Theraband roller; R glut med/ER stretch (FADIR position) 2 x 45s, extensive conversation regarding posterior precautions from R THR. Pt advised to constantly monitor R hip and avoid any pain, avoid feelings of instability, and never pushing through resistance/pain.    Trigger Point Dry Needling (TDN), unbilled Education previously performed with patient regarding potential benefit of TDN. Previously reviewed precautions and risks with patient. Pt provided verbal consent to treatment. In L sidelying using clean technique TDN performed to R gluteus maximus/medius with 3, 0.30 x 60 single needle placements with local twitch response (LTR)/deep ache reported during all placements. Pistoning technique utilized.   PATIENT EDUCATION:  Education details: Pt educated throughout session about proper posture and technique with exercises. Improved exercise technique, movement at target joints, use of target muscles after min to mod verbal, visual, tactile cues.  Person educated: Patient Education method: Explanation and Verbal cues  Education comprehension: verbalized understanding and returned demonstration    HOME EXERCISE PROGRAM:  Access Code: [email or web address] URL: [email or web address] Date:  11/12/2022 Prepared by: Ria Comment  Exercises - Mini Squat with Counter Support  - 2 x daily - 7 x weekly - 1 sets - 10 reps - Hooklying Isometric Clamshell  - 2 x daily - 7 x weekly - 1 sets - 10 reps - 10s on/10s off hold - Marching Bridge  - 2 x daily - 7 x weekly - 1 sets - 10 reps - Seated Isometric Hip Abduction (Mirrored)  - 2 x daily - 7 x weekly - 1 sets - 10 reps - 10s on/10s off hold  Patient Education - Gluteus Medius Tendinopathy - Iliotibial Band Syndrome  Gluteus medius tendinopathy positioning and activity modification; Green, blue, and gray tbands;    ASSESSMENT:  CLINICAL IMPRESSION: Today's session with a main focus on reassessment of outcome measures and pain for s/p R posterior approach THA. Since last session the patient mentioned improved pain with dry needling. Patient still reports functional limitations with prolonged ambulation and activities such as squatting bending or gardening. Today he reported at least 70% improvement in hip symptoms compared to when he started physical therapy after THR. His FOTO improved to 40 however his HOOS Jr. remains unchanged at 55.99%. His worst hip pain has increased from 5/10 from the last progress note to 6/10 with prolonged walking. Remainder of the session, PT performed dry needling and STM with improvement to hip pain. PT demonstrated correct form with LE crossover stretch with good return demonstration from pt.  No updates to HEP. Plan to continue therapy at 1x/wk for manual interventions and  to progress strengthening. He will benefit from skilled PT services in order to improve gait, strength, and pain in order to improve function at home and with leisure activities such as hiking.    OBJECTIVE IMPAIRMENTS: Abnormal gait, decreased strength, and pain.   ACTIVITY LIMITATIONS: bending, sitting, squatting, and transfers  PARTICIPATION LIMITATIONS: cleaning, driving, shopping, and community activity  PERSONAL FACTORS: 3+  comorbidities: CLL, OSA, OA, and neuropathy  are also affecting patient's functional outcome.   REHAB POTENTIAL: Excellent  CLINICAL DECISION MAKING: Evolving/moderate complexity  EVALUATION COMPLEXITY: Moderate   GOALS: Goals reviewed with patient? No  SHORT TERM GOALS: Target date: 08/13/2022   Pt will be independent with HEP in order to improve strength and decrease hip pain to improve pain-free function at home. Baseline:  Goal status: ACHIEVED   LONG TERM GOALS: Target date: 11/29/2022    Pt will increase FOTO to at least 58 to demonstrate significant improvement in function at home and with leisure activities related to hip pain  Baseline: 07/02/22: 31; 10/04/22: 36; 11/19/22: 40; Goal status: PARTIALLY MET  2.  Pt will decrease worst hip pain by at least 3 points on the NPRS in order to demonstrate clinically significant reduction in hip pain. Baseline: 07/01/21: worst: 7/10; 10/04/22: 5/10 (regularly with walking 2-3/10); 11/19/22: 6/10; Goal status: PARTIALLY MET  3.  Pt will increase HOOS Jr score by at least 18 points in order demonstrate clinically significant improvement in R hip pain and function      Baseline: 07/02/22: To be completed; 07/09/22: 11/28, Interval Score: 55.99/100; 10/04/22: 55.99/100; 11/19/22: 11 / 28, Interval Score: 55.985/100 Goal status: ONGOING  4.  Pt will report at least 75% improvement in his hip symptoms in order to return to walking and hiking without notable increase in his pain.  Baseline: 10/04/22: 70% improvement; 11/19/22: 70% improvement Goal status: PARTIALLY MET   PLAN: PT FREQUENCY: 1-2x/week  PT DURATION: 12 weeks  PLANNED INTERVENTIONS: Therapeutic exercises, Therapeutic activity, Neuromuscular re-education, Balance training, Gait training, Patient/Family education, Self Care, Joint mobilization, Joint manipulation, Vestibular training, Canalith repositioning, Orthotic/Fit training, DME instructions, Dry Needling, Electrical stimulation,  Spinal manipulation, Spinal mobilization, Cryotherapy, Moist heat, Taping, Traction, Ultrasound, Ionotophoresis 4mg /ml Dexamethasone, Manual therapy, and Re-evaluation.  PLAN FOR NEXT SESSION: manual techniques for R lateral/posterior hip, review and modify HEP as necessary, progress strengthening and normalize gait;  Christopher Go SPT  Sharalyn Ink  PT, DPT, GCS  ,, PT 11/19/2022, 11:39 AM

## 2022-11-26 ENCOUNTER — Ambulatory Visit: Payer: Medicare Other

## 2022-12-03 ENCOUNTER — Ambulatory Visit: Payer: Medicare Other

## 2022-12-03 DIAGNOSIS — M25551 Pain in right hip: Secondary | ICD-10-CM | POA: Diagnosis not present

## 2022-12-03 DIAGNOSIS — M6281 Muscle weakness (generalized): Secondary | ICD-10-CM

## 2022-12-03 NOTE — Therapy (Cosign Needed Addendum)
OUTPATIENT PHYSICAL THERAPY HIP TREATMENT/RECERTIFICATION  Patient Name: Chad Gaines MRN: [id number] DOB:1945/08/05, 77 y.o., male Today's Date: 12/04/2022  END OF SESSION:  PT End of Session - 12/03/22 0802     Visit Number 21    Number of Visits 65    Date for PT Re-Evaluation 02/25/23    Authorization Type Medicare / BCBS 2024  OZ:HYQMV on [place]  no auth req    Authorization Time Period eval: 07/02/22    PT Start Time 0800    PT Stop Time 0850    PT Time Calculation (min) 50 min    Activity Tolerance Patient tolerated treatment well    Behavior During Therapy WFL for tasks assessed/performed            Past Medical History:  Diagnosis Date   Allergy to alpha-gal    GERD (gastroesophageal reflux disease)    Hypertension    Pinched nerve    some numbness R arm, R ankle   Sleep apnea    CPAP   Past Surgical History:  Procedure Laterality Date   APPENDECTOMY     CATARACT EXTRACTION W/PHACO Left 07/03/2021   Procedure: CATARACT EXTRACTION PHACO AND INTRAOCULAR LENS PLACEMENT (IOC) LEFT VIVITY TORIC 3.59 00:27.8;  Surgeon: Nevada Crane, MD;  Location: [facility];  Service: Ophthalmology;  Laterality: Left;  sleep apnea   CATARACT EXTRACTION W/PHACO Right 07/17/2021   Procedure: CATARACT EXTRACTION PHACO AND INTRAOCULAR LENS PLACEMENT (IOC) RIGHT VIVITY TORIC 4.20 00:33.2;  Surgeon: Nevada Crane, MD;  Location: [facility];  Service: Ophthalmology;  Laterality: Right;  sleep apnea   NASAL SEPTUM SURGERY     Patient Active Problem List   Diagnosis Date Noted   Difficulty in walking, not elsewhere classified 07/18/2022   Adult hypothyroidism 12/25/2014   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   Apnea, sleep 12/25/2014   Essential (primary) hypertension 08/05/2014   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HCC) 03/08/2014   Chronic lymphocytic leukemia (HCC) 03/08/2014   PCP: Bethann Punches MD  REFERRING PROVIDER: Currie Paris MD  REFERRING DIAG:   Diagnosis  M16.11 (ICD-10-CM) - Unilateral primary osteoarthritis, right hip   RATIONALE FOR EVALUATION AND TREATMENT: Rehabilitation  THERAPY DIAG: Pain in right hip  Muscle weakness (generalized)  ONSET DATE: Multiple years  FOLLOW-UP APPT SCHEDULED WITH REFERRING PROVIDER: Yes   FROM INITIAL EVALUATION SUBJECTIVE:                                                                                                                                                                                         SUBJECTIVE STATEMENT:  R hip pain  PERTINENT HISTORY:  Pt  underwent R posterior approach THR on 06/12/22 without reported post-op complications. He was discharged on baby ASA for VTE and has been ambulating with a front wheeled walker since the surgery. He has tried using a rollator and notes that it is easier to use for walking however reports that it causes him to lean forward excessively. He has been off Tramadol for the last week and is using Tylenol to manage the pain. He reports that his sleep has been very disrupted since the surgery. He is also complaining of diarrhea since the surgery as well as mild DOE with activity. No falls reported. No chills, fevers, night sweats. No erythema, drainage, or LE swelling since the surgery. His post-op follow-up went well and staples/sutures were removed with instructions to follow-up in 4-6 weeks. Pt advised to wait at least 2 weeks for therapy after surgery and was given HEP by orthopedics which he is performing including quad sets, glut sets, ankle pumps, mini squats, and standing hip abduction.  History from PT evaluation 03/29/22: Pt reports R hip pain for multiple years with progressive worsening. Pain was initially in the anterior groin and posterior hip however the anterior hip pain has mostly resolved at this time with medication. He is now limping and walking with a hiking pole. Pain worsens throughout the day. Pt reports progressive RLE weakness  however he has not had any falls. Posterior hip pain has been nonresponsive to anti-inflammatories, oral steroids, gabapentin, and cortisone injections. Plain x-ray showed L5-S1 disc narrowing. Lumbar MRI ordered. "It has started to define my life." He used to like to walk and hike but hasn't been able to lately because of the pain. History of cervical pain and RUE radiculopathy.    PAIN:    Pain Intensity: Present: 0/10, Best: 0/10, Worst: 7/10 Pain location: Anterior and posterior R hip (mostly posterior) Pain Quality: sharp with transitions, occasional jabbing pain  Radiating: Yes, posterolateral RLE into the thigh but no longer down to the foot/ankle Numbness/Tingling: Yes, "prickly" in R thigh; Focal Weakness: Yes, RLE weakness since surgery related to pain; Aggravating factors: quick transitions, walking, in and out of bed; Relieving factors: History of prior back injury, pain, surgery, or therapy: No Dominant hand: right Imaging: Yes, see history Red flags: Positive for history of cancer (CLL), diarrhea since surgery, negative for bowel/bladder changes, saddle paresthesia, h/o spinal tumors, h/o compression fx, h/o abdominal aneurysm, chills/fever, night sweats, nausea, vomiting;  PRECAUTIONS: Posterior hip  WEIGHT BEARING RESTRICTIONS: No  FALLS: Has patient fallen in last 6 months? 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   Hobbies: Reading, walking, hiking, pastoral care;  Patient Goals: Pt would like to decrease his pain and be able to return to walking and hiking for leisure   OBJECTIVE: BP: 120/68 mmHg, HR: 68, SpO2: 98%;  Patient Surveys  FOTO 38,  predicted improvement to 38 HOOS JR: to be completed;  Cognition Patient is oriented to person, place, and time.  Recent memory is intact.  Remote memory is intact.  Attention span and concentration are intact.  Expressive speech is intact.  Patient's fund of knowledge is within normal limits for educational level.    Gross Musculoskeletal Assessment Tremor:  None Bulk: Normal Tone: Normal Incision is clean and dry and appears to be healing well. No erythema, edema, discharge, or ecchymosis noted around incision  GAIT: Antalgic gait on the RLE. Pt ambulates with combination of step-to and partial step through pattern with front wheeled walker. When practicing with rollator his gait is more normalized with step-through pattern.   Posture: Lumbar lordosis: WNL Iliac crest height: Equal bilaterally Lumbar lateral shift: Negative  AROM Deferred at this time. Time spent instructing pt in posterior hip precautions;  LE MMT: MMT (out of 5) Right  Left   Hip flexion 4* 5  Hip extension    Hip abduction (seated) 4+* 5  Hip adduction (seated) 4+* 5  Hip internal rotation (testing at neural) 5* 5  Hip external rotation 5* 5  Knee flexion (seated) 4+* 5  Knee extension 5 5  Ankle dorsiflexion 5 5  Ankle plantarflexion Strong Strong  Ankle inversion    Ankle eversion    (* = pain; Blank rows = not tested)  Sensation WNL bilaterally L5-S1. Proprioception, stereognosis, and hot/cold testing deferred on this date.  Functional Outcome Measures  Results Comments  BERG    DGI    FGA    TUG  28.3 seconds Increased fall risk, decreased function;  5TSTS     6 Minute Walk Test    10 Meter Gait Speed Self-selected: 17.5s = 0.57 m/s; Fastest: 9.4s = 1.06 m/s Self-selected gait speed below normative values for community ambulation;  (Blank rows = not tested)  Following data from re-assessment on 10/04/22: LE MMT: MMT (out of 5) Right  Left   Hip flexion 4+* 5  Hip extension 3+ 4   Hip abduction 3+ 4  Hip adduction  4 4  Hip internal rotation (testing at neural) 4* 5  Hip external rotation 3+ 5  Knee flexion 5 5  Knee extension 5 5  Ankle dorsiflexion 5 5  Ankle plantarflexion Strong Strong  Ankle inversion    Ankle eversion    (* = pain; Blank rows = not tested)  Hip abduction digital dynamometry: L: 33#, 35#, 34# (34#), R: 20#, 20.5#, 21.5# (20.7#)  Muscle Length Hamstrings: R: Positive for shortening around 60 degrees; L: Positive for shortening around 60 degrees; Ober: R: Negative L: Not examined  Palpation Location Right Left         Lumbar paraspinals 0 0  Quadratus Lumborum    Gluteus Maximus 0 1  Gluteus Medius 0 1  Deep hip external rotators 0 1  PSIS 0 0  Fortin's Area (SIJ) 0 0  Greater Trochanter 0 1  (Blank rows = not tested) Graded on 0-4 scale (0 = no pain, 1 = pain, 2 = pain with wincing/grimacing/flinching, 3 = pain with withdrawal, 4 = unwilling to allow palpation)  Passive Accessory Intervertebral Motion Pt denies reproduction of R hip or back pain with CPA L1-L5 and UPA bilaterally L1-L5. Generally, hypomobile throughout but no concordant pain reproduction;  Special Tests Lumbar Radiculopathy and Discogenic: Slump (SN 83, -LR 0.32): R: Not examined L: Not examined SLR (SN 92, -LR 0.29): R: Negative L:  Negative Crossed SLR (SP 90): R: Negative L: Negative  Hip: FABER (SN 81): R: Positive for lateral hip pain L: Negative FADIR (SN 94): R: Positive for lateral hip pain L: Negative Hip scour (SN 50): R: Not examined L: Not examined  SIJ:  Thigh Thrust (SN 88, -LR 0.18) : R: Not examined L: Not examined  Functional tests: Stairs: Poor  pelvis/R femoral eccentric control with significant weight shift onto L side and Trendelenburg. Pain reported; Forward 6" step down: Inability to control lowering on RLE with hip/knee valgus/IR;    TODAY'S TREATMENT   SUBJECTIVE: Pt reports he is doing well today. Patient reports  intermittent pain with prolonged sitting, walking, stairs and transfers. No further concerns and questions.    PAIN: Denies Pain at this time   Ther-ex  NuStep L1-4 warm-up x 10 minutes for warm-up and during interval history, no pain reported;  Hip Abduction Isometric 1 x 10;  Clamshells Eccentric with manual resistance 2 x 10;  Supine Bridges with Green Resistance Band 2 x 10;  Supine Bridges with Marches 2 x 10 x 5s hold; Lateral Stepping with Green Band x multiple lengths;   Updated outcome measures/goals; No updates to HEP.   Manual Therapy  Extensive STM to R gluteus medius, gluteus maximus, TFL, and ITB with effleurage, trigger point release, and rolling with Theraband roller; R glut med/ER stretch (FADIR position) 2 x 45s, extensive conversation regarding posterior precautions from R THR. Pt advised to constantly monitor R hip and avoid any pain, avoid feelings of instability, and never pushing through resistance/pain.    Trigger Point Dry Needling (TDN), unbilled Education previously performed with patient regarding potential benefit of TDN. Previously reviewed precautions and risks with patient. Pt provided verbal consent to treatment. In L sidelying using clean technique TDN performed to R gluteus maximus/medius with 3, 0.30 x 60 single needle placements with local twitch response (LTR)/deep ache reported during all placements. Pistoning technique utilized.   PATIENT EDUCATION:  Education details: Pt educated throughout session about proper posture and technique with exercises. Improved exercise technique, movement at target joints, use of target muscles after min to mod verbal, visual, tactile cues.  Person educated: Patient Education method: Explanation and Verbal cues  Education comprehension: verbalized understanding and returned demonstration    HOME EXERCISE PROGRAM:  Access Code: [email or web address] URL: [email or web address] Date: 11/12/2022 Prepared by:  Ria Comment  Exercises - Mini Squat with Counter Support  - 2 x daily - 7 x weekly - 1 sets - 10 reps - Hooklying Isometric Clamshell  - 2 x daily - 7 x weekly - 1 sets - 10 reps - 10s on/10s off hold - Marching Bridge  - 2 x daily - 7 x weekly - 1 sets - 10 reps - Seated Isometric Hip Abduction (Mirrored)  - 2 x daily - 7 x weekly - 1 sets - 10 reps - 10s on/10s off hold  Patient Education - Gluteus Medius Tendinopathy - Iliotibial Band Syndrome  Gluteus medius tendinopathy positioning and activity modification; Green, blue, and gray tbands;    ASSESSMENT:  CLINICAL IMPRESSION: Today's session with a main focus STM, DN and strengthening the glute medius. Patient able to tolerate more intense STM towards the glutes. He endorsed increased strength and improved pain but continues to be limited to activities such as stairs and prolonged walking. Patient tolerated increase in resistance and dynamic activity without additional report of pain. Educated patient on stairway navigation; PT cued on preventing knee valgus with right knee on descent with good return demonstration.  He reported at least 70% improvement in hip symptoms compared to when he started physical therapy after THR and his FOTO improved to 40. Since his last visit, the patient has done well demonstrating significant improvements with LE strength and progressing towards his long term goals, however his HOOS Jr. has remained unchanged at 55.99% and his  worst hip pain has increased from 5/10 to 6/10 since the last progress note. Based on his performance, PT plans to continue to continue skilled physical therapy at 1x/wk in order to improve gait, strength, and pain in order to improve function at home and with leisure activities such as hiking.    OBJECTIVE IMPAIRMENTS: Abnormal gait, decreased strength, and pain.   ACTIVITY LIMITATIONS: bending, sitting, squatting, and transfers  PARTICIPATION LIMITATIONS: cleaning, driving,  shopping, and community activity  PERSONAL FACTORS: 3+ comorbidities: CLL, OSA, OA, and neuropathy  are also affecting patient's functional outcome.   REHAB POTENTIAL: Excellent  CLINICAL DECISION MAKING: Evolving/moderate complexity  EVALUATION COMPLEXITY: Moderate   GOALS: Goals reviewed with patient? No  SHORT TERM GOALS: Target date: 08/13/2022   Pt will be independent with HEP in order to improve strength and decrease hip pain to improve pain-free function at home. Baseline:  Goal status: ACHIEVED   LONG TERM GOALS: Target date: 02/25/2023    Pt will increase FOTO to at least 58 to demonstrate significant improvement in function at home and with leisure activities related to hip pain  Baseline: 07/02/22: 31; 10/04/22: 36; 11/19/22: 40; Goal status: PARTIALLY MET  2.  Pt will decrease worst hip pain by at least 3 points on the NPRS in order to demonstrate clinically significant reduction in hip pain. Baseline: 07/01/21: worst: 7/10; 10/04/22: 5/10 (regularly with walking 2-3/10); 11/19/22: 6/10; Goal status: PARTIALLY MET  3.  Pt will increase HOOS Jr score by at least 18 points in order demonstrate clinically significant improvement in R hip pain and function      Baseline: 07/02/22: To be completed; 07/09/22: 11/28, Interval Score: 55.99/100; 10/04/22: 55.99/100; 11/19/22: 11 / 28, Interval Score: 55.985/100 Goal status: ONGOING  4.  Pt will report at least 75% improvement in his hip symptoms in order to return to walking and hiking without notable increase in his pain.  Baseline: 10/04/22: 70% improvement; 11/19/22: 70% improvement Goal status: PARTIALLY MET   PLAN: PT FREQUENCY: 1-2x/week  PT DURATION: 12 weeks  PLANNED INTERVENTIONS: Therapeutic exercises, Therapeutic activity, Neuromuscular re-education, Balance training, Gait training, Patient/Family education, Self Care, Joint mobilization, Joint manipulation, Vestibular training, Canalith repositioning, Orthotic/Fit training,  DME instructions, Dry Needling, Electrical stimulation, Spinal manipulation, Spinal mobilization, Cryotherapy, Moist heat, Taping, Traction, Ultrasound, Ionotophoresis 4mg /ml Dexamethasone, Manual therapy, and Re-evaluation.  PLAN FOR NEXT SESSION: manual techniques for R lateral/posterior hip, review and modify HEP as necessary, progress strengthening and normalize gait;  Keyston Ardolino SPT  Barbara Cower D Huprich PT, DPT, GCS

## 2022-12-10 ENCOUNTER — Ambulatory Visit: Payer: Medicare Other

## 2022-12-10 DIAGNOSIS — R262 Difficulty in walking, not elsewhere classified: Secondary | ICD-10-CM

## 2022-12-10 DIAGNOSIS — M25551 Pain in right hip: Secondary | ICD-10-CM

## 2022-12-10 DIAGNOSIS — M6281 Muscle weakness (generalized): Secondary | ICD-10-CM

## 2022-12-10 NOTE — Therapy (Cosign Needed Addendum)
OUTPATIENT PHYSICAL THERAPY HIP TREATMENT/RECERTIFICATION  Patient Name: Chad Gaines MRN: [id number] DOB:08-Dec-1945, 77 y.o., male Today's Date: 12/10/2022  END OF SESSION:  PT End of Session - 12/10/22 0758     Visit Number 22    Number of Visits 65    Date for PT Re-Evaluation 02/25/23    Authorization Type Medicare / BCBS 2024  ON:[place]  no auth req    PT Start Time 0800    PT Stop Time 0845    PT Time Calculation (min) 45 min    Activity Tolerance Patient tolerated treatment well    Behavior During Therapy WFL for tasks assessed/performed            Past Medical History:  Diagnosis Date   Allergy to alpha-gal    GERD (gastroesophageal reflux disease)    Hypertension    Pinched nerve    some numbness R arm, R ankle   Sleep apnea    CPAP   Past Surgical History:  Procedure Laterality Date   APPENDECTOMY     CATARACT EXTRACTION W/PHACO Left 07/03/2021   Procedure: CATARACT EXTRACTION PHACO AND INTRAOCULAR LENS PLACEMENT (IOC) LEFT VIVITY TORIC 3.59 00:27.8;  Surgeon: Nevada Crane, MD;  Location: [facility];  Service: Ophthalmology;  Laterality: Left;  sleep apnea   CATARACT EXTRACTION W/PHACO Right 07/17/2021   Procedure: CATARACT EXTRACTION PHACO AND INTRAOCULAR LENS PLACEMENT (IOC) RIGHT VIVITY TORIC 4.20 00:33.2;  Surgeon: Nevada Crane, MD;  Location: [facility];  Service: Ophthalmology;  Laterality: Right;  sleep apnea   NASAL SEPTUM SURGERY     Patient Active Problem List   Diagnosis Date Noted   Difficulty in walking, not elsewhere classified 07/18/2022   Adult hypothyroidism 12/25/2014   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   Apnea, sleep 12/25/2014   Essential (primary) hypertension 08/05/2014   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HCC) 03/08/2014   Chronic lymphocytic leukemia (HCC) 03/08/2014   PCP: Bethann Punches MD  REFERRING PROVIDER: Currie Paris MD  REFERRING DIAG:  Diagnosis  M16.11 (ICD-10-CM) -  Unilateral primary osteoarthritis, right hip   RATIONALE FOR EVALUATION AND TREATMENT: Rehabilitation  THERAPY DIAG: Pain in right hip  Muscle weakness (generalized)  Difficulty in walking, not elsewhere classified  ONSET DATE: Multiple years  FOLLOW-UP APPT SCHEDULED WITH REFERRING PROVIDER: Yes   FROM INITIAL EVALUATION SUBJECTIVE:                                                                                                                                                                                         SUBJECTIVE STATEMENT:  R hip pain  PERTINENT HISTORY:  Pt underwent  R posterior approach THR on 06/12/22 without reported post-op complications. He was discharged on baby ASA for VTE and has been ambulating with a front wheeled walker since the surgery. He has tried using a rollator and notes that it is easier to use for walking however reports that it causes him to lean forward excessively. He has been off Tramadol for the last week and is using Tylenol to manage the pain. He reports that his sleep has been very disrupted since the surgery. He is also complaining of diarrhea since the surgery as well as mild DOE with activity. No falls reported. No chills, fevers, night sweats. No erythema, drainage, or LE swelling since the surgery. His post-op follow-up went well and staples/sutures were removed with instructions to follow-up in 4-6 weeks. Pt advised to wait at least 2 weeks for therapy after surgery and was given HEP by orthopedics which he is performing including quad sets, glut sets, ankle pumps, mini squats, and standing hip abduction.  History from PT evaluation 03/29/22: Pt reports R hip pain for multiple years with progressive worsening. Pain was initially in the anterior groin and posterior hip however the anterior hip pain has mostly resolved at this time with medication. He is now limping and walking with a hiking pole. Pain worsens throughout the day. Pt reports  progressive RLE weakness however he has not had any falls. Posterior hip pain has been nonresponsive to anti-inflammatories, oral steroids, gabapentin, and cortisone injections. Plain x-ray showed L5-S1 disc narrowing. Lumbar MRI ordered. "It has started to define my life." He used to like to walk and hike but hasn't been able to lately because of the pain. History of cervical pain and RUE radiculopathy.    PAIN:    Pain Intensity: Present: 0/10, Best: 0/10, Worst: 7/10 Pain location: Anterior and posterior R hip (mostly posterior) Pain Quality: sharp with transitions, occasional jabbing pain  Radiating: Yes, posterolateral RLE into the thigh but no longer down to the foot/ankle Numbness/Tingling: Yes, "prickly" in R thigh; Focal Weakness: Yes, RLE weakness since surgery related to pain; Aggravating factors: quick transitions, walking, in and out of bed; Relieving factors: History of prior back injury, pain, surgery, or therapy: No Dominant hand: right Imaging: Yes, see history Red flags: Positive for history of cancer (CLL), diarrhea since surgery, negative for bowel/bladder changes, saddle paresthesia, h/o spinal tumors, h/o compression fx, h/o abdominal aneurysm, chills/fever, night sweats, nausea, vomiting;  PRECAUTIONS: Posterior hip  WEIGHT BEARING RESTRICTIONS: No  FALLS: Has patient fallen in last 6 months? 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   Hobbies: Reading, walking, hiking, pastoral care;  Patient Goals: Pt would like to decrease his pain and be able to return to walking and hiking for leisure   OBJECTIVE: BP: 120/68 mmHg, HR: 68, SpO2:  98%;  Patient Surveys  FOTO 38, predicted improvement to 36 HOOS JR: to be completed;  Cognition Patient is oriented to person, place, and time.  Recent memory is intact.  Remote memory is intact.  Attention span and concentration are intact.  Expressive speech is intact.  Patient's fund of knowledge is within normal limits for educational level.    Gross Musculoskeletal Assessment Tremor: None  Bulk: Normal Tone: Normal Incision is clean and dry and appears to be healing well. No erythema, edema, discharge, or ecchymosis noted around incision  GAIT: Antalgic gait on the RLE. Pt ambulates with combination of step-to and partial step through pattern with front wheeled walker. When practicing with rollator his gait is more normalized with step-through pattern.   Posture: Lumbar lordosis: WNL Iliac crest height: Equal bilaterally Lumbar lateral shift: Negative  AROM Deferred at this time. Time spent instructing pt in posterior hip precautions;  LE MMT: MMT (out of 5) Right  Left   Hip flexion 4* 5  Hip extension    Hip abduction (seated) 4+* 5  Hip adduction (seated) 4+* 5  Hip internal rotation (testing at neural) 5* 5  Hip external rotation 5* 5  Knee flexion (seated) 4+* 5  Knee extension 5 5  Ankle dorsiflexion 5 5  Ankle plantarflexion Strong Strong  Ankle inversion    Ankle eversion    (* = pain; Blank rows = not tested)  Sensation WNL bilaterally L5-S1. Proprioception, stereognosis, and hot/cold testing deferred on this date.  Functional Outcome Measures  Results Comments  BERG    DGI    FGA    TUG  28.3 seconds Increased fall risk, decreased function;  5TSTS     6 Minute Walk Test    10 Meter Gait Speed Self-selected: 17.5s = 0.57 m/s; Fastest: 9.4s = 1.06 m/s Self-selected gait speed below normative values for community ambulation;  (Blank rows = not tested)  Following data from re-assessment on 10/04/22: LE MMT: MMT (out of 5) Right  Left   Hip  flexion 4+* 5  Hip extension 3+ 4  Hip abduction 3+ 4  Hip adduction  4 4  Hip internal rotation (testing at neural) 4* 5  Hip external rotation 3+ 5  Knee flexion 5 5  Knee extension 5 5  Ankle dorsiflexion 5 5  Ankle plantarflexion Strong Strong  Ankle inversion    Ankle eversion    (* = pain; Blank rows = not tested)  Hip abduction digital dynamometry: L: 33#, 35#, 34# (34#), R: 20#, 20.5#, 21.5# (20.7#)  Muscle Length Hamstrings: R: Positive for shortening around 60 degrees; L: Positive for shortening around 60 degrees; Ober: R: Negative L: Not examined  Palpation Location Right Left         Lumbar paraspinals 0 0  Quadratus Lumborum    Gluteus Maximus 0 1  Gluteus Medius 0 1  Deep hip external rotators 0 1  PSIS 0 0  Fortin's Area (SIJ) 0 0  Greater Trochanter 0 1  (Blank rows = not tested) Graded on 0-4 scale (0 = no pain, 1 = pain, 2 = pain with wincing/grimacing/flinching, 3 = pain with withdrawal, 4 = unwilling to allow palpation)  Passive Accessory Intervertebral Motion Pt denies reproduction of R hip or back pain with CPA L1-L5 and UPA bilaterally L1-L5. Generally, hypomobile throughout but no concordant pain reproduction;  Special Tests Lumbar Radiculopathy and Discogenic: Slump (SN 83, -LR 0.32): R: Not examined L: Not examined SLR (SN 92, -LR 0.29): R: Negative L:  Negative Crossed SLR (SP 90): R: Negative L: Negative  Hip: FABER (SN 81): R: Positive for lateral hip pain L: Negative FADIR (SN 94): R: Positive for lateral hip pain L: Negative Hip scour (SN 50): R: Not examined L: Not examined  SIJ:  Thigh Thrust (SN 88, -LR 0.18) : R: Not examined L: Not examined  Functional tests: Stairs: Poor pelvis/R  femoral eccentric control with significant weight shift onto L side and Trendelenburg. Pain reported; Forward 6" step down: Inability to control lowering on RLE with hip/knee valgus/IR;    TODAY'S TREATMENT   SUBJECTIVE: Pt reports he is doing  well today. Patient with report of increased pain from yesterday. Still having persistent pain in the morning but endorses "Significant improvement in pain "since injection.  No further concerns and questions.    PAIN: Denies Pain at this time   Ther-ex  NuStep L1-5 warm-up x 10 minutes for warm-up and during interval history, no pain reported;  Left Sidelying Clamshell, Red and Green TB 2 x 10  hold ;  Supine Bridges with Blue Band Resistance Band 2 x 10;  Supine Bridges with Marches and Red Theraband 2 x 10 x BLE; Standing Exercises with Green Band: Hip Extension 2 x 10; Hip Abduction 2 x 10; Seated Ball Squeeze 2 x 10;  Lateral Stepping with Green Band at ankle 2 x multiple length;  Updated outcome measures/goals; No updates to HEP.   Not Performed:  Clamshells Eccentric with manual resistance 2 x 10;   Manual Therapy  Extensive STM to R gluteus medius, gluteus maximus, TFL, and ITB with effleurage, trigger point release, and rolling with Theraband roller; Pt advised to constantly monitor R hip and avoid any pain, avoid feelings of instability, and never pushing through resistance/pain.    Trigger Point Dry Needling (TDN), unbilled Education previously performed with patient regarding potential benefit of TDN. Previously reviewed precautions and risks with patient. Pt provided verbal consent to treatment. In L sidelying using clean technique TDN performed to R gluteus maximus/medius with 3, 0.30 x 60 single needle placements with local twitch response (LTR)/deep ache reported during all placements. Pistoning technique utilized.   PATIENT EDUCATION:  Education details: Pt educated throughout session about proper posture and technique with exercises. Improved exercise technique, movement at target joints, use of target muscles after min to mod verbal, visual, tactile cues.  Person educated: Patient Education method: Explanation and Verbal cues  Education comprehension:  verbalized understanding and returned demonstration    HOME EXERCISE PROGRAM:  Access Code: [email or web address] URL: [email or web address] Date: 11/12/2022 Prepared by: Ria Comment  Exercises - Mini Squat with Counter Support  - 2 x daily - 7 x weekly - 1 sets - 10 reps - Hooklying Isometric Clamshell  - 2 x daily - 7 x weekly - 1 sets - 10 reps - 10s on/10s off hold - Marching Bridge  - 2 x daily - 7 x weekly - 1 sets - 10 reps - Seated Isometric Hip Abduction (Mirrored)  - 2 x daily - 7 x weekly - 1 sets - 10 reps - 10s on/10s off hold  Patient Education - Gluteus Medius Tendinopathy - Iliotibial Band Syndrome  Gluteus medius tendinopathy positioning and activity modification; Green, blue, and gray tbands;    ASSESSMENT:  CLINICAL IMPRESSION: Today's session with a main focus STM, DN and strengthening the glute medius. Progressed strengthening exercises with increased band resistance and lateral standing exercises. Patient performed standing hip abduction and lateral walking with resistance. Patient tolerated strengthening exercises without increased report of pain; educated patient and updated HEP to include additional LE exercises. Plan for future visits to include stair training in order to improve confidence with stair navigation. Based on today's performance, PT plans to continue to continue skilled physical therapy at 1x/wk in order to improve gait, strength, and pain in order to improve function at home and with  leisure activities such as hiking.    OBJECTIVE IMPAIRMENTS: Abnormal gait, decreased strength, and pain.   ACTIVITY LIMITATIONS: bending, sitting, squatting, and transfers  PARTICIPATION LIMITATIONS: cleaning, driving, shopping, and community activity  PERSONAL FACTORS: 3+ comorbidities: CLL, OSA, OA, and neuropathy  are also affecting patient's functional outcome.   REHAB POTENTIAL: Excellent  CLINICAL DECISION MAKING: Evolving/moderate  complexity  EVALUATION COMPLEXITY: Moderate   GOALS: Goals reviewed with patient? No  SHORT TERM GOALS: Target date: 08/13/2022   Pt will be independent with HEP in order to improve strength and decrease hip pain to improve pain-free function at home. Baseline:  Goal status: ACHIEVED   LONG TERM GOALS: Target date: 02/25/2023    Pt will increase FOTO to at least 58 to demonstrate significant improvement in function at home and with leisure activities related to hip pain  Baseline: 07/02/22: 31; 10/04/22: 36; 11/19/22: 40; Goal status: PARTIALLY MET  2.  Pt will decrease worst hip pain by at least 3 points on the NPRS in order to demonstrate clinically significant reduction in hip pain. Baseline: 07/01/21: worst: 7/10; 10/04/22: 5/10 (regularly with walking 2-3/10); 11/19/22: 6/10; Goal status: PARTIALLY MET  3.  Pt will increase HOOS Jr score by at least 18 points in order demonstrate clinically significant improvement in R hip pain and function      Baseline: 07/02/22: To be completed; 07/09/22: 11/28, Interval Score: 55.99/100; 10/04/22: 55.99/100; 11/19/22: 11 / 28, Interval Score: 55.985/100 Goal status: ONGOING  4.  Pt will report at least 75% improvement in his hip symptoms in order to return to walking and hiking without notable increase in his pain.  Baseline: 10/04/22: 70% improvement; 11/19/22: 70% improvement Goal status: PARTIALLY MET   PLAN: PT FREQUENCY: 1-2x/week  PT DURATION: 12 weeks  PLANNED INTERVENTIONS: Therapeutic exercises, Therapeutic activity, Neuromuscular re-education, Balance training, Gait training, Patient/Family education, Self Care, Joint mobilization, Joint manipulation, Vestibular training, Canalith repositioning, Orthotic/Fit training, DME instructions, Dry Needling, Electrical stimulation, Spinal manipulation, Spinal mobilization, Cryotherapy, Moist heat, Taping, Traction, Ultrasound, Ionotophoresis 4mg /ml Dexamethasone, Manual therapy, and  Re-evaluation.  PLAN FOR NEXT SESSION: manual techniques for R lateral/posterior hip, review and modify HEP as necessary, progress strengthening and normalize gait;  Sanaia Jasso SPT  Barbara Cower D Huprich PT, DPT, GCS

## 2022-12-24 ENCOUNTER — Ambulatory Visit: Payer: Medicare Other | Attending: Orthopedic Surgery

## 2022-12-24 DIAGNOSIS — M6281 Muscle weakness (generalized): Secondary | ICD-10-CM | POA: Insufficient documentation

## 2022-12-24 DIAGNOSIS — R262 Difficulty in walking, not elsewhere classified: Secondary | ICD-10-CM | POA: Insufficient documentation

## 2022-12-24 DIAGNOSIS — M25551 Pain in right hip: Secondary | ICD-10-CM | POA: Insufficient documentation

## 2022-12-24 NOTE — Therapy (Cosign Needed)
OUTPATIENT PHYSICAL THERAPY HIP TREATMENT  Patient Name: Chad Gaines MRN: [id number] DOB:03-Jan-1946, 77 y.o., male Today's Date: 12/24/2022  END OF SESSION:  PT End of Session - 12/24/22 1020     Visit Number 23    Number of Visits 65    Date for PT Re-Evaluation 02/25/23    Authorization Type Medicare / BCBS 2024  [id number] on [place]  no auth req    PT Start Time 1015    PT Stop Time 1100    PT Time Calculation (min) 45 min    Equipment Utilized During Treatment Gait belt    Activity Tolerance Patient tolerated treatment well    Behavior During Therapy WFL for tasks assessed/performed            Past Medical History:  Diagnosis Date   Allergy to alpha-gal    GERD (gastroesophageal reflux disease)    Hypertension    Pinched nerve    some numbness R arm, R ankle   Sleep apnea    CPAP   Past Surgical History:  Procedure Laterality Date   APPENDECTOMY     CATARACT EXTRACTION W/PHACO Left 07/03/2021   Procedure: CATARACT EXTRACTION PHACO AND INTRAOCULAR LENS PLACEMENT (IOC) LEFT VIVITY TORIC 3.59 00:27.8;  Surgeon: Nevada Crane, MD;  Location[facility];  Service: Ophthalmology;  Laterality: Left;  sleep apnea   CATARACT EXTRACTION W/PHACO Right 07/17/2021   Procedure: CATARACT EXTRACTION PHACO AND INTRAOCULAR LENS PLACEMENT (IOC) RIGHT VIVITY TORIC 4.20 00:33.2;  Surgeon: Nevada Crane, MD;  Location: [facility];  Service: Ophthalmology;  Laterality: Right;  sleep apnea   NASAL SEPTUM SURGERY     Patient Active Problem List   Diagnosis Date Noted   Difficulty in walking, not elsewhere classified 07/18/2022   Adult hypothyroidism 12/25/2014   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   Apnea, sleep 12/25/2014   Essential (primary) hypertension 08/05/2014   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HCC) 03/08/2014   Chronic lymphocytic leukemia (HCC) 03/08/2014   PCP: Bethann Punches MD  REFERRING PROVIDER: Currie Paris MD  REFERRING DIAG:   Diagnosis  M16.11 (ICD-10-CM) - Unilateral primary osteoarthritis, right hip   RATIONALE FOR EVALUATION AND TREATMENT: Rehabilitation  THERAPY DIAG: Pain in right hip  Muscle weakness (generalized)  Difficulty in walking, not elsewhere classified  ONSET DATE: Multiple years  FOLLOW-UP APPT SCHEDULED WITH REFERRING PROVIDER: Yes   FROM INITIAL EVALUATION SUBJECTIVE:                                                                                                                                                                                         SUBJECTIVE STATEMENT:  R hip pain  PERTINENT HISTORY:  Pt underwent R posterior approach THR on 06/12/22 without reported post-op complications. He was discharged on baby ASA for VTE and has been ambulating with a front wheeled walker since the surgery. He has tried using a rollator and notes that it is easier to use for walking however reports that it causes him to lean forward excessively. He has been off Tramadol for the last week and is using Tylenol to manage the pain. He reports that his sleep has been very disrupted since the surgery. He is also complaining of diarrhea since the surgery as well as mild DOE with activity. No falls reported. No chills, fevers, night sweats. No erythema, drainage, or LE swelling since the surgery. His post-op follow-up went well and staples/sutures were removed with instructions to follow-up in 4-6 weeks. Pt advised to wait at least 2 weeks for therapy after surgery and was given HEP by orthopedics which he is performing including quad sets, glut sets, ankle pumps, mini squats, and standing hip abduction.  History from PT evaluation 03/29/22: Pt reports R hip pain for multiple years with progressive worsening. Pain was initially in the anterior groin and posterior hip however the anterior hip pain has mostly resolved at this time with medication. He is now limping and walking with a hiking pole. Pain worsens  throughout the day. Pt reports progressive RLE weakness however he has not had any falls. Posterior hip pain has been nonresponsive to anti-inflammatories, oral steroids, gabapentin, and cortisone injections. Plain x-ray showed L5-S1 disc narrowing. Lumbar MRI ordered. "It has started to define my life." He used to like to walk and hike but hasn't been able to lately because of the pain. History of cervical pain and RUE radiculopathy.    PAIN:    Pain Intensity: Present: 0/10, Best: 0/10, Worst: 7/10 Pain location: Anterior and posterior R hip (mostly posterior) Pain Quality: sharp with transitions, occasional jabbing pain  Radiating: Yes, posterolateral RLE into the thigh but no longer down to the foot/ankle Numbness/Tingling: Yes, "prickly" in R thigh; Focal Weakness: Yes, RLE weakness since surgery related to pain; Aggravating factors: quick transitions, walking, in and out of bed; Relieving factors: History of prior back injury, pain, surgery, or therapy: No Dominant hand: right Imaging: Yes, see history Red flags: Positive for history of cancer (CLL), diarrhea since surgery, negative for bowel/bladder changes, saddle paresthesia, h/o spinal tumors, h/o compression fx, h/o abdominal aneurysm, chills/fever, night sweats, nausea, vomiting;  PRECAUTIONS: Posterior hip  WEIGHT BEARING RESTRICTIONS: No  FALLS: Has patient fallen in last 6 months? 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   Hobbies: Reading, walking, hiking, pastoral care;  Patient Goals: Pt would like to decrease his pain and be able to return to walking and hiking for leisure   OBJECTIVE: BP:  120/68 mmHg, HR: 68, SpO2: 98%;  Patient Surveys  FOTO 38, predicted improvement to 78 HOOS JR: to be completed;  Cognition Patient is oriented to person, place, and time.  Recent memory is intact.  Remote memory is intact.  Attention span and concentration are intact.  Expressive speech is intact.  Patient's fund of knowledge is within normal limits for educational  level.    Gross Musculoskeletal Assessment Tremor: None Bulk: Normal Tone: Normal Incision is clean and dry and appears to be healing well. No erythema, edema, discharge, or ecchymosis noted around incision  GAIT: Antalgic gait on the RLE. Pt ambulates with combination of step-to and partial step through pattern with front wheeled walker. When practicing with rollator his gait is more normalized with step-through pattern.   Posture: Lumbar lordosis: WNL Iliac crest height: Equal bilaterally Lumbar lateral shift: Negative  AROM Deferred at this time. Time spent instructing pt in posterior hip precautions;  LE MMT: MMT (out of 5) Right  Left   Hip flexion 4* 5  Hip extension    Hip abduction (seated) 4+* 5  Hip adduction (seated) 4+* 5  Hip internal rotation (testing at neural) 5* 5  Hip external rotation 5* 5  Knee flexion (seated) 4+* 5  Knee extension 5 5  Ankle dorsiflexion 5 5  Ankle plantarflexion Strong Strong  Ankle inversion    Ankle eversion    (* = pain; Blank rows = not tested)  Sensation WNL bilaterally L5-S1. Proprioception, stereognosis, and hot/cold testing deferred on this date.  Functional Outcome Measures  Results Comments  BERG    DGI    FGA    TUG  28.3 seconds Increased fall risk, decreased function;  5TSTS     6 Minute Walk Test    10 Meter Gait Speed Self-selected: 17.5s = 0.57 m/s; Fastest: 9.4s = 1.06 m/s Self-selected gait speed below normative values for community ambulation;  (Blank rows = not tested)  Following data from re-assessment on 10/04/22: LE MMT: MMT (out  of 5) Right  Left   Hip flexion 4+* 5  Hip extension 3+ 4  Hip abduction 3+ 4  Hip adduction  4 4  Hip internal rotation (testing at neural) 4* 5  Hip external rotation 3+ 5  Knee flexion 5 5  Knee extension 5 5  Ankle dorsiflexion 5 5  Ankle plantarflexion Strong Strong  Ankle inversion    Ankle eversion    (* = pain; Blank rows = not tested)  Hip abduction digital dynamometry: L: 33#, 35#, 34# (34#), R: 20#, 20.5#, 21.5# (20.7#)  Muscle Length Hamstrings: R: Positive for shortening around 60 degrees; L: Positive for shortening around 60 degrees; Ober: R: Negative L: Not examined  Palpation Location Right Left         Lumbar paraspinals 0 0  Quadratus Lumborum    Gluteus Maximus 0 1  Gluteus Medius 0 1  Deep hip external rotators 0 1  PSIS 0 0  Fortin's Area (SIJ) 0 0  Greater Trochanter 0 1  (Blank rows = not tested) Graded on 0-4 scale (0 = no pain, 1 = pain, 2 = pain with wincing/grimacing/flinching, 3 = pain with withdrawal, 4 = unwilling to allow palpation)  Passive Accessory Intervertebral Motion Pt denies reproduction of R hip or back pain with CPA L1-L5 and UPA bilaterally L1-L5. Generally, hypomobile throughout but no concordant pain reproduction;  Special Tests Lumbar Radiculopathy and Discogenic: Slump (SN 83, -LR 0.32): R: Not examined L: Not examined SLR (SN 92, -LR 0.29): R: Negative L:  Negative Crossed SLR (SP 90): R: Negative L: Negative  Hip: FABER (SN 81): R: Positive for lateral hip pain L: Negative FADIR (SN 94): R: Positive for lateral hip pain L: Negative Hip scour (SN 50): R: Not examined L: Not examined  SIJ:  Thigh Thrust (SN 88, -LR 0.18) : R: Not examined  L: Not examined  Functional tests: Stairs: Poor pelvis/R femoral eccentric control with significant weight shift onto L side and Trendelenburg. Pain reported; Forward 6" step down: Inability to control lowering on RLE with hip/knee valgus/IR;    TODAY'S TREATMENT  SUBJECTIVE:  Pt reports he is doing well today. Pt endorses improvement in right hip pain. Pt still reports using cane for community ambulation. No further concerns and questions.    PAIN: Denies resting pain.    Ther-ex  Palpation: Palpated Gluteal Maximus/Medius and deep gluteal; no trigger points located and no reported pain; NuStep L1-5 warm-up x 10 minutes for warm-up and during interval history, no pain reported; Supine Hip with Isometric Abduction 1 x 10 x 10s hold  Left Sidelying Clamshell, Blue Theraband (TB) 2 x 10; Supine Bridges with Marches and Green TB 2 x 10 x BLE; SLR with Abduction 2 x 10 BLE (LLE);  Supine Hip Extension against manual resistance 1 x 10 BLE; Sit to stand with Green TB around thighs 2 x 10; Lateral Stepping with Blue band at thighs in // bars x 8 (no pain reported)   Not Performed:  Clamshells Eccentric with manual resistance 2 x 10;     PATIENT EDUCATION:  Education details: Pt educated throughout session about proper posture and technique with exercises. Improved exercise technique, movement at target joints, use of target muscles after min to mod verbal, visual, tactile cues.  Person educated: Patient Education method: Explanation and Verbal cues  Education comprehension: verbalized understanding and returned demonstration    HOME EXERCISE PROGRAM:  Access Code: [email or web address] URL: [email or web address] Date: 12/11/2022 Prepared by: Ria Comment  Exercises - Mini Squat with Counter Support  - 2 x daily - 7 x weekly - 1 sets - 10 reps - Hooklying Isometric Clamshell  - 2 x daily - 7 x weekly - 1 sets - 10 reps - 10s on/10s off hold - Seated Isometric Hip Abduction (Mirrored)  - 2 x daily - 7 x weekly - 1 sets - 10 reps - 10s on/10s off hold - Supine Bridge with Resistance Band  - 2 x daily - 7 x weekly - 1 sets - 10 reps - Seated Isometric Hip Adduction with Ball  - 2 x daily - 7 x weekly - 1 sets - 10 reps - Hip Abduction with Resistance Loop   - 2 x daily - 7 x weekly - 1 sets - 10 reps - Hip Extension with Resistance Loop  - 2 x daily - 7 x weekly - 1 sets - 10 reps  Patient Education - Gluteus Medius Tendinopathy - Iliotibial Band Syndrome  Gluteus medius tendinopathy positioning and activity modification; Green, blue, and gray tbands;    ASSESSMENT:  CLINICAL IMPRESSION: Today's session with a main focus on progressing LE strength. Deferred Dry needling and STM due to patient report of improved pain. Demonstrated lateral stepping with increased resistance without additional report of pain. He still has pain with prolonged sitting or navigating stairways but Pt endorsed improvements with LE strength and pain. PT updated HEP to include lateral stepping. Based on today's performance, PT plans to continue to continue skilled physical therapy at 1x/wk in order to improve gait, strength, and pain in order to improve function at home and with leisure activities such as hiking.    OBJECTIVE IMPAIRMENTS: Abnormal gait, decreased strength, and pain.   ACTIVITY LIMITATIONS: bending, sitting, squatting, and transfers  PARTICIPATION LIMITATIONS: cleaning, driving, shopping, and community activity  PERSONAL FACTORS: 3+ comorbidities: CLL,  OSA, OA, and neuropathy  are also affecting patient's functional outcome.   REHAB POTENTIAL: Excellent  CLINICAL DECISION MAKING: Evolving/moderate complexity  EVALUATION COMPLEXITY: Moderate   GOALS: Goals reviewed with patient? No  SHORT TERM GOALS: Target date: 08/13/2022   Pt will be independent with HEP in order to improve strength and decrease hip pain to improve pain-free function at home. Baseline:  Goal status: ACHIEVED   LONG TERM GOALS: Target date: 02/25/2023    Pt will increase FOTO to at least 58 to demonstrate significant improvement in function at home and with leisure activities related to hip pain  Baseline: 07/02/22: 31; 10/04/22: 36; 11/19/22: 40; Goal status: PARTIALLY  MET  2.  Pt will decrease worst hip pain by at least 3 points on the NPRS in order to demonstrate clinically significant reduction in hip pain. Baseline: 07/01/21: worst: 7/10; 10/04/22: 5/10 (regularly with walking 2-3/10); 11/19/22: 6/10; Goal status: PARTIALLY MET  3.  Pt will increase HOOS Jr score by at least 18 points in order demonstrate clinically significant improvement in R hip pain and function      Baseline: 07/02/22: To be completed; 07/09/22: 11/28, Interval Score: 55.99/100; 10/04/22: 55.99/100; 11/19/22: 11 / 28, Interval Score: 55.985/100 Goal status: ONGOING  4.  Pt will report at least 75% improvement in his hip symptoms in order to return to walking and hiking without notable increase in his pain.  Baseline: 10/04/22: 70% improvement; 11/19/22: 70% improvement Goal status: PARTIALLY MET   PLAN: PT FREQUENCY: 1-2x/week  PT DURATION: 12 weeks  PLANNED INTERVENTIONS: Therapeutic exercises, Therapeutic activity, Neuromuscular re-education, Balance training, Gait training, Patient/Family education, Self Care, Joint mobilization, Joint manipulation, Vestibular training, Canalith repositioning, Orthotic/Fit training, DME instructions, Dry Needling, Electrical stimulation, Spinal manipulation, Spinal mobilization, Cryotherapy, Moist heat, Taping, Traction, Ultrasound, Ionotophoresis 4mg /ml Dexamethasone, Manual therapy, and Re-evaluation.  PLAN FOR NEXT SESSION: manual techniques for R lateral/posterior hip, review and modify HEP as necessary, progress strengthening and normalize gait;   Damyon Mullane SPT  Barbara Cower D Huprich PT, DPT, GCS

## 2023-01-07 ENCOUNTER — Ambulatory Visit: Payer: Medicare Other

## 2023-01-07 DIAGNOSIS — M6281 Muscle weakness (generalized): Secondary | ICD-10-CM

## 2023-01-07 DIAGNOSIS — M25551 Pain in right hip: Secondary | ICD-10-CM | POA: Diagnosis not present

## 2023-01-07 NOTE — Therapy (Cosign Needed)
OUTPATIENT PHYSICAL THERAPY HIP TREATMENT  Patient Name: Chad Gaines MRN: [id number] DOB:1945-10-10, 77 y.o., male Today's Date: 01/08/2023  END OF SESSION:  PT End of Session - 01/07/23 0805     Visit Number 24    Number of Visits 65    Date for PT Re-Evaluation 02/25/23    Authorization Type Medicare / BCBS 2024  GM:[place]  no auth req    PT Start Time 0800    PT Stop Time 0845    PT Time Calculation (min) 45 min    Equipment Utilized During Treatment Gait belt    Activity Tolerance Patient tolerated treatment well    Behavior During Therapy WFL for tasks assessed/performed            Past Medical History:  Diagnosis Date   Allergy to alpha-gal    GERD (gastroesophageal reflux disease)    Hypertension    Pinched nerve    some numbness R arm, R ankle   Sleep apnea    CPAP   Past Surgical History:  Procedure Laterality Date   APPENDECTOMY     CATARACT EXTRACTION W/PHACO Left 07/03/2021   Procedure: CATARACT EXTRACTION PHACO AND INTRAOCULAR LENS PLACEMENT (IOC) LEFT VIVITY TORIC 3.59 00:27.8;  Surgeon: Nevada Crane, MD;  Location: [facility];  Service: Ophthalmology;  Laterality: Left;  sleep apnea   CATARACT EXTRACTION W/PHACO Right 07/17/2021   Procedure: CATARACT EXTRACTION PHACO AND INTRAOCULAR LENS PLACEMENT (IOC) RIGHT VIVITY TORIC 4.20 00:33.2;  Surgeon: Nevada Crane, MD;  Location: [facility];  Service: Ophthalmology;  Laterality: Right;  sleep apnea   NASAL SEPTUM SURGERY     Patient Active Problem List   Diagnosis Date Noted   Difficulty in walking, not elsewhere classified 07/18/2022   Adult hypothyroidism 12/25/2014   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   Apnea, sleep 12/25/2014   Essential (primary) hypertension 08/05/2014   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HCC) 03/08/2014   Chronic lymphocytic leukemia (HCC) 03/08/2014   PCP: Bethann Punches MD  REFERRING PROVIDER: Currie Paris MD  REFERRING DIAG:   Diagnosis  M16.11 (ICD-10-CM) - Unilateral primary osteoarthritis, right hip   RATIONALE FOR EVALUATION AND TREATMENT: Rehabilitation  THERAPY DIAG: Pain in right hip  Muscle weakness (generalized)  ONSET DATE: Multiple years  FOLLOW-UP APPT SCHEDULED WITH REFERRING PROVIDER: Yes   FROM INITIAL EVALUATION SUBJECTIVE:                                                                                                                                                                                         SUBJECTIVE STATEMENT:  R hip pain  PERTINENT HISTORY:  Pt underwent R posterior approach THR on 06/12/22 without reported post-op complications. He was discharged on baby ASA for VTE and has been ambulating with a front wheeled walker since the surgery. He has tried using a rollator and notes that it is easier to use for walking however reports that it causes him to lean forward excessively. He has been off Tramadol for the last week and is using Tylenol to manage the pain. He reports that his sleep has been very disrupted since the surgery. He is also complaining of diarrhea since the surgery as well as mild DOE with activity. No falls reported. No chills, fevers, night sweats. No erythema, drainage, or LE swelling since the surgery. His post-op follow-up went well and staples/sutures were removed with instructions to follow-up in 4-6 weeks. Pt advised to wait at least 2 weeks for therapy after surgery and was given HEP by orthopedics which he is performing including quad sets, glut sets, ankle pumps, mini squats, and standing hip abduction.  History from PT evaluation 03/29/22: Pt reports R hip pain for multiple years with progressive worsening. Pain was initially in the anterior groin and posterior hip however the anterior hip pain has mostly resolved at this time with medication. He is now limping and walking with a hiking pole. Pain worsens throughout the day. Pt reports progressive RLE weakness  however he has not had any falls. Posterior hip pain has been nonresponsive to anti-inflammatories, oral steroids, gabapentin, and cortisone injections. Plain x-ray showed L5-S1 disc narrowing. Lumbar MRI ordered. "It has started to define my life." He used to like to walk and hike but hasn't been able to lately because of the pain. History of cervical pain and RUE radiculopathy.    PAIN:    Pain Intensity: Present: 0/10, Best: 0/10, Worst: 7/10 Pain location: Anterior and posterior R hip (mostly posterior) Pain Quality: sharp with transitions, occasional jabbing pain  Radiating: Yes, posterolateral RLE into the thigh but no longer down to the foot/ankle Numbness/Tingling: Yes, "prickly" in R thigh; Focal Weakness: Yes, RLE weakness since surgery related to pain; Aggravating factors: quick transitions, walking, in and out of bed; Relieving factors: History of prior back injury, pain, surgery, or therapy: No Dominant hand: right Imaging: Yes, see history Red flags: Positive for history of cancer (CLL), diarrhea since surgery, negative for bowel/bladder changes, saddle paresthesia, h/o spinal tumors, h/o compression fx, h/o abdominal aneurysm, chills/fever, night sweats, nausea, vomiting;  PRECAUTIONS: Posterior hip  WEIGHT BEARING RESTRICTIONS: No  FALLS: Has patient fallen in last 6 months? 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   Hobbies: Reading, walking, hiking, pastoral care;  Patient Goals: Pt would like to decrease his pain and be able to return to walking and hiking for leisure   OBJECTIVE: BP: 120/68 mmHg, HR: 68, SpO2: 98%;  Patient Surveys  FOTO 38,  predicted improvement to 22 HOOS JR: to be completed;  Cognition Patient is oriented to person, place, and time.  Recent memory is intact.  Remote memory is intact.  Attention span and concentration are intact.  Expressive speech is intact.  Patient's fund of knowledge is within normal limits for educational level.    Gross Musculoskeletal Assessment  Tremor: None Bulk: Normal Tone: Normal Incision is clean and dry and appears to be healing well. No erythema, edema, discharge, or ecchymosis noted around incision  GAIT: Antalgic gait on the RLE. Pt ambulates with combination of step-to and partial step through pattern with front wheeled walker. When practicing with rollator his gait is more normalized with step-through pattern.   Posture: Lumbar lordosis: WNL Iliac crest height: Equal bilaterally Lumbar lateral shift: Negative  AROM Deferred at this time. Time spent instructing pt in posterior hip precautions;  LE MMT: MMT (out of 5) Right  Left   Hip flexion 4* 5  Hip extension    Hip abduction (seated) 4+* 5  Hip adduction (seated) 4+* 5  Hip internal rotation (testing at neural) 5* 5  Hip external rotation 5* 5  Knee flexion (seated) 4+* 5  Knee extension 5 5  Ankle dorsiflexion 5 5  Ankle plantarflexion Strong Strong  Ankle inversion    Ankle eversion    (* = pain; Blank rows = not tested)  Sensation WNL bilaterally L5-S1. Proprioception, stereognosis, and hot/cold testing deferred on this date.  Functional Outcome Measures  Results Comments  BERG    DGI    FGA    TUG  28.3 seconds Increased fall risk, decreased function;  5TSTS     6 Minute Walk Test    10 Meter Gait Speed Self-selected: 17.5s = 0.57 m/s; Fastest: 9.4s = 1.06 m/s Self-selected gait speed below normative values for community ambulation;  (Blank rows = not tested)  Following data from re-assessment on 10/04/22: LE MMT: MMT (out of 5) Right  Left   Hip flexion 4+* 5  Hip extension 3+ 4   Hip abduction 3+ 4  Hip adduction  4 4  Hip internal rotation (testing at neural) 4* 5  Hip external rotation 3+ 5  Knee flexion 5 5  Knee extension 5 5  Ankle dorsiflexion 5 5  Ankle plantarflexion Strong Strong  Ankle inversion    Ankle eversion    (* = pain; Blank rows = not tested)  Hip abduction digital dynamometry: L: 33#, 35#, 34# (34#), R: 20#, 20.5#, 21.5# (20.7#)  Muscle Length Hamstrings: R: Positive for shortening around 60 degrees; L: Positive for shortening around 60 degrees; Ober: R: Negative L: Not examined  Palpation Location Right Left         Lumbar paraspinals 0 0  Quadratus Lumborum    Gluteus Maximus 0 1  Gluteus Medius 0 1  Deep hip external rotators 0 1  PSIS 0 0  Fortin's Area (SIJ) 0 0  Greater Trochanter 0 1  (Blank rows = not tested) Graded on 0-4 scale (0 = no pain, 1 = pain, 2 = pain with wincing/grimacing/flinching, 3 = pain with withdrawal, 4 = unwilling to allow palpation)  Passive Accessory Intervertebral Motion Pt denies reproduction of R hip or back pain with CPA L1-L5 and UPA bilaterally L1-L5. Generally, hypomobile throughout but no concordant pain reproduction;  Special Tests Lumbar Radiculopathy and Discogenic: Slump (SN 83, -LR 0.32): R: Not examined L: Not examined SLR (SN 92, -LR 0.29): R: Negative L:  Negative Crossed SLR (SP 90): R: Negative L: Negative  Hip: FABER (SN 81): R: Positive for lateral hip pain L: Negative FADIR (SN 94): R: Positive for lateral hip pain L: Negative Hip scour (SN 50): R: Not examined L: Not examined  SIJ:  Thigh Thrust (SN 88, -LR 0.18) : R: Not examined L: Not examined  Functional tests: Stairs:  Poor pelvis/R femoral eccentric control with significant weight shift onto L side and Trendelenburg. Pain reported; Forward 6" step down: Inability to control lowering on RLE with hip/knee valgus/IR;    TODAY'S TREATMENT  SUBJECTIVE: Pt reports he is doing well today. Pt reports improvements in  LE strength and lesser pain. No further concerns and questions.    PAIN: Denies resting pain.    Ther-ex  NuStep L1-5 warm-up x 10 minutes for warm-up and during interval history, no pain reported; Palpation: Palpated glute medius, IT band, lateral hamstring; no trigger points noted.  Supine Clamshell against manual resistance 2 x 10; Supine SLR 2 x 10;  Sidelying Hip Abduction against manual resistance 2 x 10; Standing Hip Abduction against Blue Band 2 x 10; Standing Hip Extension against Blue 2 x 10;  Sit to Stand with Blue Theraband around thighs 2 x 10;    PATIENT EDUCATION:  Education details: Pt educated throughout session about proper posture and technique with exercises. Improved exercise technique, movement at target joints, use of target muscles after min to mod verbal, visual, tactile cues.  Person educated: Patient Education method: Explanation and Verbal cues  Education comprehension: verbalized understanding and returned demonstration    HOME EXERCISE PROGRAM:  Access Code: [email or web address] URL: [email or web address] Date: 12/11/2022 Prepared by: Ria Comment  Exercises - Mini Squat with Counter Support  - 2 x daily - 7 x weekly - 1 sets - 10 reps - Hooklying Isometric Clamshell  - 2 x daily - 7 x weekly - 1 sets - 10 reps - 10s on/10s off hold - Seated Isometric Hip Abduction (Mirrored)  - 2 x daily - 7 x weekly - 1 sets - 10 reps - 10s on/10s off hold - Supine Bridge with Resistance Band  - 2 x daily - 7 x weekly - 1 sets - 10 reps - Seated Isometric Hip Adduction with Ball  - 2 x daily - 7 x weekly - 1 sets - 10 reps - Hip Abduction with Resistance Loop  - 2 x daily - 7 x weekly - 1 sets - 10 reps - Hip Extension with Resistance Loop  - 2 x daily - 7 x weekly - 1 sets - 10 reps  Patient Education - Gluteus Medius Tendinopathy - Iliotibial Band Syndrome  Gluteus medius tendinopathy positioning and activity modification; Green, blue, and gray tbands;     ASSESSMENT:  CLINICAL IMPRESSION: Today's session with a main focus on progressing LE strength. Progressed LE strength within patient tolerance. Pt performed sidelying straight leg abduction; only able to tolerate without resistance. PT educated on exercise modification in order to increase LE strength. He continues to demonstrate improvements with increased resistance and LE strength. No updates to HEP today. Based on today's performance, PT plans to continue to continue skilled physical therapy at 1x/wk in order to improve gait, strength, and pain in order to improve function at home and with leisure activities such as hiking.    OBJECTIVE IMPAIRMENTS: Abnormal gait, decreased strength, and pain.   ACTIVITY LIMITATIONS: bending, sitting, squatting, and transfers  PARTICIPATION LIMITATIONS: cleaning, driving, shopping, and community activity  PERSONAL FACTORS: 3+ comorbidities: CLL, OSA, OA, and neuropathy  are also affecting patient's functional outcome.   REHAB POTENTIAL: Excellent  CLINICAL DECISION MAKING: Evolving/moderate complexity  EVALUATION COMPLEXITY: Moderate   GOALS: Goals reviewed with patient? No  SHORT TERM GOALS: Target date: 08/13/2022   Pt will be independent with HEP in order to improve strength and decrease hip pain to  improve pain-free function at home. Baseline:  Goal status: ACHIEVED   LONG TERM GOALS: Target date: 02/25/2023    Pt will increase FOTO to at least 58 to demonstrate significant improvement in function at home and with leisure activities related to hip pain  Baseline: 07/02/22: 31; 10/04/22: 36; 11/19/22: 40; Goal status: PARTIALLY MET  2.  Pt will decrease worst hip pain by at least 3 points on the NPRS in order to demonstrate clinically significant reduction in hip pain. Baseline: 07/01/21: worst: 7/10; 10/04/22: 5/10 (regularly with walking 2-3/10); 11/19/22: 6/10; Goal status: PARTIALLY MET  3.  Pt will increase HOOS Jr score by at least  18 points in order demonstrate clinically significant improvement in R hip pain and function      Baseline: 07/02/22: To be completed; 07/09/22: 11/28, Interval Score: 55.99/100; 10/04/22: 55.99/100; 11/19/22: 11 / 28, Interval Score: 55.985/100 Goal status: ONGOING  4.  Pt will report at least 75% improvement in his hip symptoms in order to return to walking and hiking without notable increase in his pain.  Baseline: 10/04/22: 70% improvement; 11/19/22: 70% improvement Goal status: PARTIALLY MET   PLAN: PT FREQUENCY: 1-2x/week  PT DURATION: 12 weeks  PLANNED INTERVENTIONS: Therapeutic exercises, Therapeutic activity, Neuromuscular re-education, Balance training, Gait training, Patient/Family education, Self Care, Joint mobilization, Joint manipulation, Vestibular training, Canalith repositioning, Orthotic/Fit training, DME instructions, Dry Needling, Electrical stimulation, Spinal manipulation, Spinal mobilization, Cryotherapy, Moist heat, Taping, Traction, Ultrasound, Ionotophoresis 4mg /ml Dexamethasone, Manual therapy, and Re-evaluation.  PLAN FOR NEXT SESSION: manual techniques for R lateral/posterior hip, review and modify HEP as necessary, progress strengthening and normalize gait;   Radames Mejorado SPT  Barbara Cower D Huprich PT, DPT, GCS

## 2023-01-21 ENCOUNTER — Ambulatory Visit: Payer: Medicare Other

## 2023-02-04 ENCOUNTER — Ambulatory Visit: Payer: Medicare Other | Attending: Orthopedic Surgery

## 2023-02-04 DIAGNOSIS — M6281 Muscle weakness (generalized): Secondary | ICD-10-CM | POA: Insufficient documentation

## 2023-02-04 DIAGNOSIS — M25551 Pain in right hip: Secondary | ICD-10-CM | POA: Insufficient documentation

## 2023-02-04 NOTE — Therapy (Addendum)
OUTPATIENT PHYSICAL THERAPY HIP TREATMENT  Patient Name: Chad Gaines MRN: [id number] DOB:10-10-1945, 77 y.o., male Today's Date: 02/04/2023  END OF SESSION:  PT End of Session - 02/04/23 0845     Visit Number 25    Number of Visits 65    Date for PT Re-Evaluation 02/25/23    Authorization Type Medicare / BCBS 2024  [id number] on [place]  no auth req    PT Start Time 0845    PT Stop Time 0930    PT Time Calculation (min) 45 min    Activity Tolerance Patient tolerated treatment well    Behavior During Therapy WFL for tasks assessed/performed             Past Medical History:  Diagnosis Date   Allergy to alpha-gal    GERD (gastroesophageal reflux disease)    Hypertension    Pinched nerve    some numbness R arm, R ankle   Sleep apnea    CPAP   Past Surgical History:  Procedure Laterality Date   APPENDECTOMY     CATARACT EXTRACTION W/PHACO Left 07/03/2021   Procedure: CATARACT EXTRACTION PHACO AND INTRAOCULAR LENS PLACEMENT (IOC) LEFT VIVITY TORIC 3.59 00:27.8;  Surgeon: Nevada Crane, MD;  Location: [facility];  Service: Ophthalmology;  Laterality: Left;  sleep apnea   CATARACT EXTRACTION W/PHACO Right 07/17/2021   Procedure: CATARACT EXTRACTION PHACO AND INTRAOCULAR LENS PLACEMENT (IOC) RIGHT VIVITY TORIC 4.20 00:33.2;  Surgeon: Nevada Crane, MD;  Location: [facility];  Service: Ophthalmology;  Laterality: Right;  sleep apnea   NASAL SEPTUM SURGERY     Patient Active Problem List   Diagnosis Date Noted   Difficulty in walking, not elsewhere classified 07/18/2022   Adult hypothyroidism 12/25/2014   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   Apnea, sleep 12/25/2014   Essential (primary) hypertension 08/05/2014   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HCC) 03/08/2014   Chronic lymphocytic leukemia (HCC) 03/08/2014   PCP: Bethann Punches MD  REFERRING PROVIDER: Currie Paris MD  REFERRING DIAG:  Diagnosis  M16.11 (ICD-10-CM) - Unilateral primary  osteoarthritis, right hip   RATIONALE FOR EVALUATION AND TREATMENT: Rehabilitation  THERAPY DIAG: Pain in right hip  Muscle weakness (generalized)  ONSET DATE: Multiple years  FOLLOW-UP APPT SCHEDULED WITH REFERRING PROVIDER: Yes   FROM INITIAL EVALUATION SUBJECTIVE:                                                                                                                                                                                         SUBJECTIVE STATEMENT:  R hip pain  PERTINENT HISTORY:  Pt underwent R posterior approach THR on 06/12/22  without reported post-op complications. He was discharged on baby ASA for VTE and has been ambulating with a front wheeled walker since the surgery. He has tried using a rollator and notes that it is easier to use for walking however reports that it causes him to lean forward excessively. He has been off Tramadol for the last week and is using Tylenol to manage the pain. He reports that his sleep has been very disrupted since the surgery. He is also complaining of diarrhea since the surgery as well as mild DOE with activity. No falls reported. No chills, fevers, night sweats. No erythema, drainage, or LE swelling since the surgery. His post-op follow-up went well and staples/sutures were removed with instructions to follow-up in 4-6 weeks. Pt advised to wait at least 2 weeks for therapy after surgery and was given HEP by orthopedics which he is performing including quad sets, glut sets, ankle pumps, mini squats, and standing hip abduction.  History from PT evaluation 03/29/22: Pt reports R hip pain for multiple years with progressive worsening. Pain was initially in the anterior groin and posterior hip however the anterior hip pain has mostly resolved at this time with medication. He is now limping and walking with a hiking pole. Pain worsens throughout the day. Pt reports progressive RLE weakness however he has not had any falls. Posterior hip pain  has been nonresponsive to anti-inflammatories, oral steroids, gabapentin, and cortisone injections. Plain x-ray showed L5-S1 disc narrowing. Lumbar MRI ordered. "It has started to define my life." He used to like to walk and hike but hasn't been able to lately because of the pain. History of cervical pain and RUE radiculopathy.    PAIN:    Pain Intensity: Present: 0/10, Best: 0/10, Worst: 7/10 Pain location: Anterior and posterior R hip (mostly posterior) Pain Quality: sharp with transitions, occasional jabbing pain  Radiating: Yes, posterolateral RLE into the thigh but no longer down to the foot/ankle Numbness/Tingling: Yes, "prickly" in R thigh; Focal Weakness: Yes, RLE weakness since surgery related to pain; Aggravating factors: quick transitions, walking, in and out of bed; Relieving factors: History of prior back injury, pain, surgery, or therapy: No Dominant hand: right Imaging: Yes, see history Red flags: Positive for history of cancer (CLL), diarrhea since surgery, negative for bowel/bladder changes, saddle paresthesia, h/o spinal tumors, h/o compression fx, h/o abdominal aneurysm, chills/fever, night sweats, nausea, vomiting;  PRECAUTIONS: Posterior hip  WEIGHT BEARING RESTRICTIONS: No  FALLS: Has patient fallen in last 6 months? 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   Hobbies: Reading, walking, hiking, pastoral care;  Patient Goals: Pt would like to decrease his pain and be able to return to walking and hiking for leisure   OBJECTIVE: BP: 120/68 mmHg, HR: 68, SpO2: 98%;  Patient Surveys  FOTO 38, predicted improvement to 2 HOOS JR: to be  completed;  Cognition Patient is oriented to person, place, and time.  Recent memory is intact.  Remote memory is intact.  Attention span and concentration are intact.  Expressive speech is intact.  Patient's fund of knowledge is within normal limits for educational level.    Gross Musculoskeletal Assessment Tremor: None Bulk: Normal Tone: Normal Incision is  clean and dry and appears to be healing well. No erythema, edema, discharge, or ecchymosis noted around incision  GAIT: Antalgic gait on the RLE. Pt ambulates with combination of step-to and partial step through pattern with front wheeled walker. When practicing with rollator his gait is more normalized with step-through pattern.   Posture: Lumbar lordosis: WNL Iliac crest height: Equal bilaterally Lumbar lateral shift: Negative  AROM Deferred at this time. Time spent instructing pt in posterior hip precautions;  LE MMT: MMT (out of 5) Right  Left   Hip flexion 4* 5  Hip extension    Hip abduction (seated) 4+* 5  Hip adduction (seated) 4+* 5  Hip internal rotation (testing at neural) 5* 5  Hip external rotation 5* 5  Knee flexion (seated) 4+* 5  Knee extension 5 5  Ankle dorsiflexion 5 5  Ankle plantarflexion Strong Strong  Ankle inversion    Ankle eversion    (* = pain; Blank rows = not tested)  Sensation WNL bilaterally L5-S1. Proprioception, stereognosis, and hot/cold testing deferred on this date.  Functional Outcome Measures  Results Comments  BERG    DGI    FGA    TUG  28.3 seconds Increased fall risk, decreased function;  5TSTS     6 Minute Walk Test    10 Meter Gait Speed Self-selected: 17.5s = 0.57 m/s; Fastest: 9.4s = 1.06 m/s Self-selected gait speed below normative values for community ambulation;  (Blank rows = not tested)  Following data from re-assessment on 10/04/22: LE MMT: MMT (out of 5) Right  Left   Hip flexion 4+* 5  Hip extension 3+ 4  Hip abduction 3+ 4  Hip adduction  4 4   Hip internal rotation (testing at neural) 4* 5  Hip external rotation 3+ 5  Knee flexion 5 5  Knee extension 5 5  Ankle dorsiflexion 5 5  Ankle plantarflexion Strong Strong  Ankle inversion    Ankle eversion    (* = pain; Blank rows = not tested)  Hip abduction digital dynamometry: L: 33#, 35#, 34# (34#), R: 20#, 20.5#, 21.5# (20.7#)  Muscle Length Hamstrings: R: Positive for shortening around 60 degrees; L: Positive for shortening around 60 degrees; Ober: R: Negative L: Not examined  Palpation Location Right Left         Lumbar paraspinals 0 0  Quadratus Lumborum    Gluteus Maximus 0 1  Gluteus Medius 0 1  Deep hip external rotators 0 1  PSIS 0 0  Fortin's Area (SIJ) 0 0  Greater Trochanter 0 1  (Blank rows = not tested) Graded on 0-4 scale (0 = no pain, 1 = pain, 2 = pain with wincing/grimacing/flinching, 3 = pain with withdrawal, 4 = unwilling to allow palpation)  Passive Accessory Intervertebral Motion Pt denies reproduction of R hip or back pain with CPA L1-L5 and UPA bilaterally L1-L5. Generally, hypomobile throughout but no concordant pain reproduction;  Special Tests Lumbar Radiculopathy and Discogenic: Slump (SN 83, -LR 0.32): R: Not examined L: Not examined SLR (SN 92, -LR 0.29): R: Negative L:  Negative Crossed SLR (SP 90): R: Negative L: Negative  Hip: FABER (SN 81): R: Positive for lateral hip pain L: Negative FADIR (SN 94): R: Positive for lateral hip pain L: Negative Hip scour (SN 50): R: Not examined L: Not examined  SIJ:  Thigh Thrust (SN 88, -LR 0.18) : R: Not examined L: Not examined  Functional tests: Stairs: Poor pelvis/R femoral eccentric control with significant weight  shift onto L side and Trendelenburg. Pain reported; Forward 6" step down: Inability to control lowering on RLE with hip/knee valgus/IR;    TODAY'S TREATMENT  SUBJECTIVE: Pt reports he is doing very well today. He reports a intermittent pain with prolonged sitting but able  to walk further without cane and progress HEP with increased repetitions. No further concerns and questions.    PAIN: Denies resting pain.    Ther-ex  NuStep L1-5 warm-up x 10 minutes for warm-up and during interval history, no pain reported; Partial Squats 2 x 10 (Second Set, Blue TB around thigh); Standing Hip Abduction with Blue TB around thigh 2 x 12; Single Leg Squat with TRX, raised seat height (too difficult) 1 x 8 BLE;  Lateral Step Down 2 x 12 BLE, BUE Support;  Standing Hip Flexion March, against green TB 2 x 8 x 2s, No UE support;    Lateral Band Walk, Blue Theraband around ankle 2 x 56'; Standing Hamstring Curl, No UE support 2 x 12; Standing Hip Extension with ankle weight (5#) 2 x 12;    PATIENT EDUCATION:  Education details: Pt educated throughout session about proper posture and technique with exercises. Improved exercise technique, movement at target joints, use of target muscles after min to mod verbal, visual, tactile cues.  Person educated: Patient Education method: Explanation and Verbal cues  Education comprehension: verbalized understanding and returned demonstration    HOME EXERCISE PROGRAM:  Access Code: [email or web address] URL: [email or web address] Date: 02/04/2023 Prepared by: Ria Comment  Exercises - Mini Squat with Counter Support  - 2 x daily - 7 x weekly - 2-3 sets - 10 reps - Hooklying Isometric Clamshell  - 2 x daily - 7 x weekly - 2-3 sets - 10 reps - 10s on/10s off hold - Seated Isometric Hip Abduction (Mirrored)  - 2 x daily - 7 x weekly - 2-3 sets - 10 reps - 10s on/10s off hold - Supine Bridge with Resistance Band  - 2 x daily - 7 x weekly - 2-3 sets - 10 reps - Seated Isometric Hip Adduction with Ball  - 2 x daily - 7 x weekly - 2-3 sets - 10 reps - Hip Abduction with Resistance Loop  - 2 x daily - 7 x weekly - 2-3 sets - 10 reps - Hip Extension with Resistance Loop  - 2 x daily - 7 x weekly - 2-3 sets - 10 reps - Side Stepping with  Resistance at Thighs  - 1 x daily - 7 x weekly - 2-3 sets - 10 reps - Lateral Step Down  - 1 x daily - 7 x weekly - 2-3 sets - 10-12 reps - Standing Hip Flexion March  - 1 x daily - 7 x weekly - 2-3 sets - 10-12 reps  Gluteus medius tendinopathy positioning and activity modification; Green, blue, and gray tbands;    ASSESSMENT:  CLINICAL IMPRESSION: Today's session with a main focus on progressing LE strength. Progressed LE strength within patient tolerance. Pt has demonstrated improvements with LE strength and pain since his last visit. Today patient tolerated standing exercises with increased intensity and repetitions without reports of additional pain. Updated HEP to include additional exercises in dependent positions. Updated outcome measures in today's appointment; pt endorsed acceptance to discharge in next appointment. Based on today's performance, PT plans to continue to continue skilled physical therapy at 1x/wk in order to improve gait, strength, and pain in order to improve function at home and with leisure activities such  as hiking.    OBJECTIVE IMPAIRMENTS: Abnormal gait, decreased strength, and pain.   ACTIVITY LIMITATIONS: bending, sitting, squatting, and transfers  PARTICIPATION LIMITATIONS: cleaning, driving, shopping, and community activity  PERSONAL FACTORS: 3+ comorbidities: CLL, OSA, OA, and neuropathy  are also affecting patient's functional outcome.   REHAB POTENTIAL: Excellent  CLINICAL DECISION MAKING: Evolving/moderate complexity  EVALUATION COMPLEXITY: Moderate   GOALS: Goals reviewed with patient? No  SHORT TERM GOALS: Target date: 08/13/2022   Pt will be independent with HEP in order to improve strength and decrease hip pain to improve pain-free function at home. Baseline:  Goal status: ACHIEVED   LONG TERM GOALS: Target date: 02/25/2023    Pt will increase FOTO to at least 58 to demonstrate significant improvement in function at home and with  leisure activities related to hip pain  Baseline: 07/02/22: 31; 10/04/22: 36; 11/19/22: 40; Goal status: PARTIALLY MET  2.  Pt will decrease worst hip pain by at least 3 points on the NPRS in order to demonstrate clinically significant reduction in hip pain. Baseline: 07/01/21: worst: 7/10; 10/04/22: 5/10 (regularly with walking 2-3/10); 11/19/22: 6/10; 02/04/2023: 3/10  Goal status: PARTIALLY MET  3.  Pt will increase HOOS Jr score by at least 18 points in order demonstrate clinically significant improvement in R hip pain and function      Baseline: 07/02/22: To be completed; 07/09/22: 11/28, Interval Score: 55.99/100; 10/04/22: 55.99/100; 11/19/22: 11 / 28, Interval Score: 55.985/100; HOOS, Jr. Score: 10 / 28, Interval Score: 58.93 / 100 Goal status: ONGOING  4.  Pt will report at least 75% improvement in his hip symptoms in order to return to walking and hiking without notable increase in his pain.  Baseline: 10/04/22: 70% improvement; 11/19/22: 70% improvement; 02/04/2023: 80% Goal status: PARTIALLY MET   PLAN: PT FREQUENCY: 1-2x/week  PT DURATION: 12 weeks  PLANNED INTERVENTIONS: Therapeutic exercises, Therapeutic activity, Neuromuscular re-education, Balance training, Gait training, Patient/Family education, Self Care, Joint mobilization, Joint manipulation, Vestibular training, Canalith repositioning, Orthotic/Fit training, DME instructions, Dry Needling, Electrical stimulation, Spinal manipulation, Spinal mobilization, Cryotherapy, Moist heat, Taping, Traction, Ultrasound, Ionotophoresis 4mg /ml Dexamethasone, Manual therapy, and Re-evaluation.  PLAN FOR NEXT SESSION: discharge, manual techniques for R lateral/posterior hip, review and modify HEP as necessary, progress strengthening and normalize gait;   Rayelynn Loyal SPT  Sharalyn Ink Huprich PT, DPT, GCS  02/04/2023 12:15 PM

## 2023-02-22 ENCOUNTER — Ambulatory Visit: Payer: Medicare Other | Attending: Orthopedic Surgery

## 2023-02-22 DIAGNOSIS — M25551 Pain in right hip: Secondary | ICD-10-CM | POA: Insufficient documentation

## 2023-02-22 DIAGNOSIS — M6281 Muscle weakness (generalized): Secondary | ICD-10-CM | POA: Diagnosis present

## 2023-02-22 DIAGNOSIS — R262 Difficulty in walking, not elsewhere classified: Secondary | ICD-10-CM | POA: Insufficient documentation

## 2023-02-22 NOTE — Therapy (Addendum)
OUTPATIENT PHYSICAL THERAPY HIP TREATMENT/RE-CERTIFICATION  Patient Name: Chad Gaines MRN: [id number] DOB:[birth year], 77 y.o., male Today's Date: 02/22/2023  END OF SESSION:  PT End of Session - 02/22/23 0809     Visit Number 26    Number of Visits 71    Date for PT Re-Evaluation 04/05/23    Authorization Type Medicare / BCBS 2024  [id number] [place]  no auth req    PT Start Time 0800    PT Stop Time 0845    PT Time Calculation (min) 45 min    Activity Tolerance Patient tolerated treatment well    Behavior During Therapy WFL for tasks assessed/performed             Past Medical History:  Diagnosis Date   Allergy to alpha-gal    GERD (gastroesophageal reflux disease)    Hypertension    Pinched nerve    some numbness R arm, R ankle   Sleep apnea    CPAP   Past Surgical History:  Procedure Laterality Date   APPENDECTOMY     CATARACT EXTRACTION W/PHACO Left 07/03/2021   Procedure: CATARACT EXTRACTION PHACO AND INTRAOCULAR LENS PLACEMENT (IOC) LEFT VIVITY TORIC 3.59 00:27.8;  Surgeon: Nevada Crane, MD;  Location: [facility];  Service: Ophthalmology;  Laterality: Left;  sleep apnea   CATARACT EXTRACTION W/PHACO Right 07/17/2021   Procedure: CATARACT EXTRACTION PHACO AND INTRAOCULAR LENS PLACEMENT (IOC) RIGHT VIVITY TORIC 4.20 00:33.2;  Surgeon: Nevada Crane, MD;  Location: [facility];  Service: Ophthalmology;  Laterality: Right;  sleep apnea   NASAL SEPTUM SURGERY     Patient Active Problem List   Diagnosis Date Noted   Difficulty in walking, not elsewhere classified 07/18/2022   Adult hypothyroidism 12/25/2014   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   Apnea, sleep 12/25/2014   Essential (primary) hypertension 08/05/2014   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HCC) 03/08/2014   Chronic lymphocytic leukemia (HCC) 03/08/2014   PCP: Bethann Punches MD  REFERRING PROVIDER: Currie Paris MD  REFERRING DIAG:  Diagnosis  M16.11 (ICD-10-CM) -  Unilateral primary osteoarthritis, right hip   RATIONALE FOR EVALUATION AND TREATMENT: Rehabilitation  THERAPY DIAG: Pain in right hip  Muscle weakness (generalized)  ONSET DATE: Multiple years  FOLLOW-UP APPT SCHEDULED WITH REFERRING PROVIDER: Yes   FROM INITIAL EVALUATION SUBJECTIVE:                                                                                                                                                                                         SUBJECTIVE STATEMENT:  R hip pain  PERTINENT HISTORY:  Pt underwent R posterior approach THR on 06/12/22  without reported post-op complications. He was discharged on baby ASA for VTE and has been ambulating with a front wheeled walker since the surgery. He has tried using a rollator and notes that it is easier to use for walking however reports that it causes him to lean forward excessively. He has been off Tramadol for the last week and is using Tylenol to manage the pain. He reports that his sleep has been very disrupted since the surgery. He is also complaining of diarrhea since the surgery as well as mild DOE with activity. No falls reported. No chills, fevers, night sweats. No erythema, drainage, or LE swelling since the surgery. His post-op follow-up went well and staples/sutures were removed with instructions to follow-up in 4-6 weeks. Pt advised to wait at least 2 weeks for therapy after surgery and was given HEP by orthopedics which he is performing including quad sets, glut sets, ankle pumps, mini squats, and standing hip abduction.  History from PT evaluation 03/29/22: Pt reports R hip pain for multiple years with progressive worsening. Pain was initially in the anterior groin and posterior hip however the anterior hip pain has mostly resolved at this time with medication. He is now limping and walking with a hiking pole. Pain worsens throughout the day. Pt reports progressive RLE weakness however he has not had any falls.  Posterior hip pain has been nonresponsive to anti-inflammatories, oral steroids, gabapentin, and cortisone injections. Plain x-ray showed L5-S1 disc narrowing. Lumbar MRI ordered. "It has started to define my life." He used to like to walk and hike but hasn't been able to lately because of the pain. History of cervical pain and RUE radiculopathy.    PAIN:    Pain Intensity: Present: 0/10, Best: 0/10, Worst: 7/10 Pain location: Anterior and posterior R hip (mostly posterior) Pain Quality: sharp with transitions, occasional jabbing pain  Radiating: Yes, posterolateral RLE into the thigh but no longer down to the foot/ankle Numbness/Tingling: Yes, "prickly" in R thigh; Focal Weakness: Yes, RLE weakness since surgery related to pain; Aggravating factors: quick transitions, walking, in and out of bed; Relieving factors: History of prior back injury, pain, surgery, or therapy: No Dominant hand: right Imaging: Yes, see history Red flags: Positive for history of cancer (CLL), diarrhea since surgery, negative for bowel/bladder changes, saddle paresthesia, h/o spinal tumors, h/o compression fx, h/o abdominal aneurysm, chills/fever, night sweats, nausea, vomiting;  PRECAUTIONS: Posterior hip  WEIGHT BEARING RESTRICTIONS: No  FALLS: Has patient fallen in last 6 months? 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   Hobbies: Reading, walking, hiking, pastoral care;  Patient Goals: Pt would like to decrease his pain and be able to return to walking and hiking for leisure   OBJECTIVE: BP: 120/68 mmHg, HR: 68, SpO2: 98%;  Patient Surveys  FOTO 38, predicted improvement to 87 HOOS  JR: to be completed;  Cognition Patient is oriented to person, place, and time.  Recent memory is intact.  Remote memory is intact.  Attention span and concentration are intact.  Expressive speech is intact.  Patient's fund of knowledge is within normal limits for educational level.    Gross Musculoskeletal Assessment Tremor: None Bulk: Normal Tone: Normal Incision is  clean and dry and appears to be healing well. No erythema, edema, discharge, or ecchymosis noted around incision  GAIT: Antalgic gait on the RLE. Pt ambulates with combination of step-to and partial step through pattern with front wheeled walker. When practicing with rollator his gait is more normalized with step-through pattern.   Posture: Lumbar lordosis: WNL Iliac crest height: Equal bilaterally Lumbar lateral shift: Negative  AROM Deferred at this time. Time spent instructing pt in posterior hip precautions;  LE MMT: MMT (out of 5) Right  Left   Hip flexion 4* 5  Hip extension    Hip abduction (seated) 4+* 5  Hip adduction (seated) 4+* 5  Hip internal rotation (testing at neural) 5* 5  Hip external rotation 5* 5  Knee flexion (seated) 4+* 5  Knee extension 5 5  Ankle dorsiflexion 5 5  Ankle plantarflexion Strong Strong  Ankle inversion    Ankle eversion    (* = pain; Blank rows = not tested)  Sensation WNL bilaterally L5-S1. Proprioception, stereognosis, and hot/cold testing deferred on this date.  Functional Outcome Measures  Results Comments  BERG    DGI    FGA    TUG  28.3 seconds Increased fall risk, decreased function;  5TSTS     6 Minute Walk Test    10 Meter Gait Speed Self-selected: 17.5s = 0.57 m/s; Fastest: 9.4s = 1.06 m/s Self-selected gait speed below normative values for community ambulation;  (Blank rows = not tested)  Following data from re-assessment on 10/04/22: LE MMT: MMT (out of 5) Right  Left   Hip flexion 4+* 5  Hip extension 3+ 4  Hip abduction 3+ 4  Hip  adduction  4 4  Hip internal rotation (testing at neural) 4* 5  Hip external rotation 3+ 5  Knee flexion 5 5  Knee extension 5 5  Ankle dorsiflexion 5 5  Ankle plantarflexion Strong Strong  Ankle inversion    Ankle eversion    (* = pain; Blank rows = not tested)  Hip abduction digital dynamometry: L: 33#, 35#, 34# (34#), R: 20#, 20.5#, 21.5# (20.7#)  Muscle Length Hamstrings: R: Positive for shortening around 60 degrees; L: Positive for shortening around 60 degrees; Ober: R: Negative L: Not examined  Palpation Location Right Left         Lumbar paraspinals 0 0  Quadratus Lumborum    Gluteus Maximus 0 1  Gluteus Medius 0 1  Deep hip external rotators 0 1  PSIS 0 0  Fortin's Area (SIJ) 0 0  Greater Trochanter 0 1  (Blank rows = not tested) Graded on 0-4 scale (0 = no pain, 1 = pain, 2 = pain with wincing/grimacing/flinching, 3 = pain with withdrawal, 4 = unwilling to allow palpation)  Passive Accessory Intervertebral Motion Pt denies reproduction of R hip or back pain with CPA L1-L5 and UPA bilaterally L1-L5. Generally, hypomobile throughout but no concordant pain reproduction;  Special Tests Lumbar Radiculopathy and Discogenic: Slump (SN 83, -LR 0.32): R: Not examined L: Not examined SLR (SN 92, -LR 0.29): R: Negative L:  Negative Crossed SLR (SP 90): R: Negative L: Negative  Hip: FABER (SN 81): R: Positive for lateral hip pain L: Negative FADIR (SN 94): R: Positive for lateral hip pain L: Negative Hip scour (SN 50): R: Not examined L: Not examined  SIJ:  Thigh Thrust (SN 88, -LR 0.18) : R: Not examined L: Not examined  Functional tests: Stairs: Poor pelvis/R femoral eccentric control with significant weight  shift onto L side and Trendelenburg. Pain reported; Forward 6" step down: Inability to control lowering on RLE with hip/knee valgus/IR;    TODAY'S TREATMENT  SUBJECTIVE: Pt reports he is doing very well today. He reports a intermittent pain with prolonged  sitting but able to walk further without cane and progress HEP with increased repetitions. No further concerns and questions.    PAIN: Denies resting pain.    Ther-ex  NuStep L1-3 warm-up x 8 minutes for warm-up and during interval history, no pain reported; Partial Squats, no UE support 2 x 10;  Standing Hip Abduction, knee at 90 flexion with Blue TB around thigh 2 x 12; Lateral Step Down 2 x 12 BLE, Single UE Support;  Nautilus Resisted Retro/Lateral Gait 50# x 2 trials ea direction;  Nautilus Resisted Retro Gait 50# with blazepods, random protocol, LE deactivation 1 min/bout x 2 bouts;  Nautilus Resisted Lateral Gait 50# with blazepods, focus protocol, LE deactivation 1 min/bout x 2 bouts;  Forward Lunge on Bosu 1 x 10 ea Leg (intermittent BUE support); Single Leg Squat 1 x 8 (verbal and tactile cues);     PATIENT EDUCATION:  Education details: Pt educated throughout session about proper posture and technique with exercises. Improved exercise technique, movement at target joints, use of target muscles after min to mod verbal, visual, tactile cues.  Person educated: Patient Education method: Explanation and Verbal cues  Education comprehension: verbalized understanding and returned demonstration    HOME EXERCISE PROGRAM:  Access Code: [email or web address] URL: [email or web address] Date: 02/04/2023 Prepared by: Ria Comment  Exercises - Mini Squat with Counter Support  - 2 x daily - 7 x weekly - 2-3 sets - 10 reps - Hooklying Isometric Clamshell  - 2 x daily - 7 x weekly - 2-3 sets - 10 reps - 10s on/10s off hold - Seated Isometric Hip Abduction (Mirrored)  - 2 x daily - 7 x weekly - 2-3 sets - 10 reps - 10s on/10s off hold - Supine Bridge with Resistance Band  - 2 x daily - 7 x weekly - 2-3 sets - 10 reps - Seated Isometric Hip Adduction with Ball  - 2 x daily - 7 x weekly - 2-3 sets - 10 reps - Hip Abduction with Resistance Loop  - 2 x daily - 7 x weekly - 2-3 sets - 10  reps - Hip Extension with Resistance Loop  - 2 x daily - 7 x weekly - 2-3 sets - 10 reps - Side Stepping with Resistance at Thighs  - 1 x daily - 7 x weekly - 2-3 sets - 10 reps - Lateral Step Down  - 1 x daily - 7 x weekly - 2-3 sets - 10-12 reps - Standing Hip Flexion March  - 1 x daily - 7 x weekly - 2-3 sets - 10-12 reps  Gluteus medius tendinopathy positioning and activity modification; Green, blue, and gray tbands;    ASSESSMENT:  CLINICAL IMPRESSION: Patient arrived to physical therapy highly motivated to increase LE strength and decrease pain. Patient has demonstrated significant improvements in LE strength in gluteal and thigh musculature. He has began to walk community distances without use of AD. Today he tolerated increased intensity and dual tasking activities without report of additional pain. PT updated functional outcomes in today's visit; pt has reported improvements in functional mobility. Currently he still has limitations with stairs due to pain and walking longer distances. Based on today's performance, PT plans to continue to continue skilled physical therapy  at 1x/wk in order to improve gait, strength, and pain in order to improve function at home and with leisure activities such as hiking.    OBJECTIVE IMPAIRMENTS: Abnormal gait, decreased strength, and pain.   ACTIVITY LIMITATIONS: bending, sitting, squatting, and transfers  PARTICIPATION LIMITATIONS: cleaning, driving, shopping, and community activity  PERSONAL FACTORS: 3+ comorbidities: CLL, OSA, OA, and neuropathy  are also affecting patient's functional outcome.   REHAB POTENTIAL: Excellent  CLINICAL DECISION MAKING: Evolving/moderate complexity  EVALUATION COMPLEXITY: Moderate   GOALS: Goals reviewed with patient? No  SHORT TERM GOALS: Target date: 08/13/2022   Pt will be independent with HEP in order to improve strength and decrease hip pain to improve pain-free function at home. Baseline:  Goal  status: ACHIEVED   LONG TERM GOALS: Target date: 02/25/2023    Pt will increase FOTO to at least 58 to demonstrate significant improvement in function at home and with leisure activities related to hip pain  Baseline: 07/02/22: 31; 10/04/22: 36; 11/19/22: 40; 02/22/2023: 60 Goal status: Goal Met   2.  Pt will decrease worst hip pain by at least 3 points on the NPRS in order to demonstrate clinically significant reduction in hip pain. Baseline: 07/01/21: worst: 7/10; 10/04/22: 5/10 (regularly with walking 2-3/10); 11/19/22: 6/10; 02/04/2023: 3/10; 02/22/2023: 3/10 Goal status: PARTIALLY MET  3.  Pt will increase HOOS Jr score by at least 18 points in order demonstrate clinically significant improvement in R hip pain and function      Baseline: 07/02/22: To be completed; 07/09/22: 11/28, Interval Score: 55.99/100; 10/04/22: 55.99/100; 11/19/22: 11 / 28, Interval Score: 55.985/100; HOOS, Jr. Score: 10 / 28, Interval Score: 58.93 / 100; 02/22/2023: HOOS, Jr. Score: 6 / 28, Interval Score: 70.426 / 100 Goal status: ONGOING  4.  Pt will report at least 75% improvement in his hip symptoms in order to return to walking and hiking without notable increase in his pain.  Baseline: 10/04/22: 70% improvement; 11/19/22: 70% improvement; 02/04/2023: 80%; 02/22/2023: 85% Goal status: Goal Met    PLAN: PT FREQUENCY: 1-2x/week  PT DURATION: 6 weeks  PLANNED INTERVENTIONS: Therapeutic exercises, Therapeutic activity, Neuromuscular re-education, Balance training, Gait training, Patient/Family education, Self Care, Joint mobilization, Joint manipulation, Vestibular training, Canalith repositioning, Orthotic/Fit training, DME instructions, Dry Needling, Electrical stimulation, Spinal manipulation, Spinal mobilization, Cryotherapy, Moist heat, Taping, Traction, Ultrasound, Ionotophoresis 4mg /ml Dexamethasone, Manual therapy, and Re-evaluation.  PLAN FOR NEXT SESSION:  manual techniques for R lateral/posterior hip, review and  modify HEP as necessary, progress strengthening and normalize gait;   Diera Wirkkala SPT  Sharalyn Ink Huprich PT, DPT, GCS  02/22/2023 10:39 AM

## 2023-03-04 NOTE — Therapy (Signed)
OUTPATIENT PHYSICAL THERAPY HIP TREATMENT/RECERTIFICATION  Patient Name: Bacilio Rhyner MRN: [id number] DOB:1946/01/28, 77 y.o., male Today's Date: 03/04/2023  END OF SESSION:    Past Medical History:  Diagnosis Date   Allergy to alpha-gal    GERD (gastroesophageal reflux disease)    Hypertension    Pinched nerve    some numbness R arm, R ankle   Sleep apnea    CPAP   Past Surgical History:  Procedure Laterality Date   APPENDECTOMY     CATARACT EXTRACTION W/PHACO Left 07/03/2021   Procedure: CATARACT EXTRACTION PHACO AND INTRAOCULAR LENS PLACEMENT (IOC) LEFT VIVITY TORIC 3.59 00:27.8;  Surgeon: Nevada Crane, MD;  Location: [facility];  Service: Ophthalmology;  Laterality: Left;  sleep apnea   CATARACT EXTRACTION W/PHACO Right 07/17/2021   Procedure: CATARACT EXTRACTION PHACO AND INTRAOCULAR LENS PLACEMENT (IOC) RIGHT VIVITY TORIC 4.20 00:33.2;  Surgeon: Nevada Crane, MD;  Location: [facility];  Service: Ophthalmology;  Laterality: Right;  sleep apnea   NASAL SEPTUM SURGERY     Patient Active Problem List   Diagnosis Date Noted   Difficulty in walking, not elsewhere classified 07/18/2022   Adult hypothyroidism 12/25/2014   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   Apnea, sleep 12/25/2014   Essential (primary) hypertension 08/05/2014   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HCC) 03/08/2014   Chronic lymphocytic leukemia (HCC) 03/08/2014   PCP: Bethann Punches MD  REFERRING PROVIDER: Currie Paris MD  REFERRING DIAG:  Diagnosis  M16.11 (ICD-10-CM) - Unilateral primary osteoarthritis, right hip   RATIONALE FOR EVALUATION AND TREATMENT: Rehabilitation  THERAPY DIAG: Pain in right hip  Muscle weakness (generalized)  Difficulty in walking, not elsewhere classified  ONSET DATE: Multiple years  FOLLOW-UP APPT SCHEDULED WITH REFERRING PROVIDER: Yes   FROM INITIAL EVALUATION SUBJECTIVE:                                                                                                                                                                                          SUBJECTIVE STATEMENT:  R hip pain  PERTINENT HISTORY:  Pt underwent R posterior approach THR on 06/12/22 without reported post-op complications. He was discharged on baby ASA for VTE and has been ambulating with a front wheeled walker since the surgery. He has tried using a rollator and notes that it is easier to use for walking however reports that it causes him to lean forward excessively. He has been off Tramadol for the last week and is using Tylenol to manage the pain. He reports that his sleep has been very disrupted since the surgery. He is also complaining of diarrhea since the surgery as well as  mild DOE with activity. No falls reported. No chills, fevers, night sweats. No erythema, drainage, or LE swelling since the surgery. His post-op follow-up went well and staples/sutures were removed with instructions to follow-up in 4-6 weeks. Pt advised to wait at least 2 weeks for therapy after surgery and was given HEP by orthopedics which he is performing including quad sets, glut sets, ankle pumps, mini squats, and standing hip abduction.  History from PT evaluation 03/29/22: Pt reports R hip pain for multiple years with progressive worsening. Pain was initially in the anterior groin and posterior hip however the anterior hip pain has mostly resolved at this time with medication. He is now limping and walking with a hiking pole. Pain worsens throughout the day. Pt reports progressive RLE weakness however he has not had any falls. Posterior hip pain has been nonresponsive to anti-inflammatories, oral steroids, gabapentin, and cortisone injections. Plain x-ray showed L5-S1 disc narrowing. Lumbar MRI ordered. "It has started to define my life." He used to like to walk and hike but hasn't been able to lately because of the pain. History of cervical pain and RUE radiculopathy.    PAIN:     Pain Intensity: Present: 0/10, Best: 0/10, Worst: 7/10 Pain location: Anterior and posterior R hip (mostly posterior) Pain Quality: sharp with transitions, occasional jabbing pain  Radiating: Yes, posterolateral RLE into the thigh but no longer down to the foot/ankle Numbness/Tingling: Yes, "prickly" in R thigh; Focal Weakness: Yes, RLE weakness since surgery related to pain; Aggravating factors: quick transitions, walking, in and out of bed; Relieving factors: History of prior back injury, pain, surgery, or therapy: No Dominant hand: right Imaging: Yes, see history Red flags: Positive for history of cancer (CLL), diarrhea since surgery, negative for bowel/bladder changes, saddle paresthesia, h/o spinal tumors, h/o compression fx, h/o abdominal aneurysm, chills/fever, night sweats, nausea, vomiting;  PRECAUTIONS: Posterior hip  WEIGHT BEARING RESTRICTIONS: No  FALLS: Has patient fallen in last 6 months? 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   Hobbies: Reading, walking, hiking, pastoral care;  Patient Goals: Pt would like to decrease his pain and be able to return to walking and hiking for leisure   OBJECTIVE: BP: 120/68 mmHg, HR: 68, SpO2: 98%;  Patient Surveys  FOTO 38, predicted improvement to 44 HOOS JR: to be completed;  Cognition Patient is oriented to person, place, and time.  Recent memory is intact.  Remote memory is intact.  Attention span and concentration are intact.  Expressive speech is intact.  Patient's fund of knowledge is within normal limits for educational level.    Gross Musculoskeletal Assessment Tremor: None Bulk:  Normal Tone: Normal Incision is clean and dry and appears to be healing well. No erythema, edema, discharge, or ecchymosis noted around incision  GAIT: Antalgic gait on the RLE. Pt ambulates with combination of step-to and partial step through pattern with front wheeled walker. When practicing with rollator his gait is more normalized with step-through pattern.   Posture: Lumbar lordosis: WNL Iliac crest height: Equal bilaterally Lumbar lateral shift: Negative  AROM Deferred at this time. Time spent instructing pt in posterior hip precautions;  LE MMT: MMT (out of 5) Right  Left  Hip flexion 4* 5  Hip extension    Hip abduction (seated) 4+* 5  Hip adduction (seated) 4+* 5  Hip internal rotation (testing at neural) 5* 5  Hip external rotation 5* 5  Knee flexion (seated) 4+* 5  Knee extension 5 5  Ankle dorsiflexion 5 5  Ankle plantarflexion Strong Strong  Ankle inversion    Ankle eversion    (* = pain; Blank rows = not tested)  Sensation WNL bilaterally L5-S1. Proprioception, stereognosis, and hot/cold testing deferred on this date.  Functional Outcome Measures  Results Comments  BERG    DGI    FGA    TUG  28.3 seconds Increased fall risk, decreased function;  5TSTS     6 Minute Walk Test    10 Meter Gait Speed Self-selected: 17.5s = 0.57 m/s; Fastest: 9.4s = 1.06 m/s Self-selected gait speed below normative values for community ambulation;  (Blank rows = not tested)  Following data from re-assessment on 10/04/22: LE MMT: MMT (out of 5) Right  Left   Hip flexion 4+* 5  Hip extension 3+ 4  Hip abduction 3+ 4  Hip adduction  4 4  Hip internal rotation (testing at neural) 4* 5  Hip external rotation 3+ 5  Knee flexion 5 5  Knee extension 5 5  Ankle dorsiflexion 5 5  Ankle plantarflexion Strong Strong  Ankle inversion    Ankle eversion    (* = pain; Blank rows = not tested)  Hip abduction digital dynamometry: L: 33#, 35#, 34# (34#), R: 20#, 20.5#, 21.5#  (20.7#)  Muscle Length Hamstrings: R: Positive for shortening around 60 degrees; L: Positive for shortening around 60 degrees; Ober: R: Negative L: Not examined  Palpation Location Right Left         Lumbar paraspinals 0 0  Quadratus Lumborum    Gluteus Maximus 0 1  Gluteus Medius 0 1  Deep hip external rotators 0 1  PSIS 0 0  Fortin's Area (SIJ) 0 0  Greater Trochanter 0 1  (Blank rows = not tested) Graded on 0-4 scale (0 = no pain, 1 = pain, 2 = pain with wincing/grimacing/flinching, 3 = pain with withdrawal, 4 = unwilling to allow palpation)  Passive Accessory Intervertebral Motion Pt denies reproduction of R hip or back pain with CPA L1-L5 and UPA bilaterally L1-L5. Generally, hypomobile throughout but no concordant pain reproduction;  Special Tests Lumbar Radiculopathy and Discogenic: Slump (SN 83, -LR 0.32): R: Not examined L: Not examined SLR (SN 92, -LR 0.29): R: Negative L:  Negative Crossed SLR (SP 90): R: Negative L: Negative  Hip: FABER (SN 81): R: Positive for lateral hip pain L: Negative FADIR (SN 94): R: Positive for lateral hip pain L: Negative Hip scour (SN 50): R: Not examined L: Not examined  SIJ:  Thigh Thrust (SN 88, -LR 0.18) : R: Not examined L: Not examined  Functional tests: Stairs: Poor pelvis/R femoral eccentric control with significant weight shift onto L side and Trendelenburg. Pain reported; Forward 6" step down: Inability to control lowering on RLE with hip/knee valgus/IR;    TODAY'S TREATMENT  SUBJECTIVE: Pt reports he is doing very well today. He reports a intermittent pain with prolonged sitting but able to walk further without cane and progress HEP with increased repetitions. No further concerns and questions.    PAIN: Denies resting pain.    Ther-ex  NuStep L1-3 warm-up x 8 minutes for warm-up and during interval history, no pain reported; Partial Squats, no UE  support 2 x 10;  Standing Hip Abduction, knee at 90 flexion with  Blue TB around thigh 2 x 12; Lateral Step Down 2 x 12 BLE, Single UE Support;  Nautilus Resisted Retro/Lateral Gait 50# x 2 trials ea direction;  Nautilus Resisted Retro Gait 50# with blazepods, random protocol, LE deactivation 1 min/bout x 2 bouts;  Nautilus Resisted Lateral Gait 50# with blazepods, focus protocol, LE deactivation 1 min/bout x 2 bouts;  Forward Lunge on Bosu 1 x 10 ea Leg (intermittent BUE support); Single Leg Squat 1 x 8 (verbal and tactile cues);     PATIENT EDUCATION:  Education details: Pt educated throughout session about proper posture and technique with exercises. Improved exercise technique, movement at target joints, use of target muscles after min to mod verbal, visual, tactile cues.  Person educated: Patient Education method: Explanation and Verbal cues  Education comprehension: verbalized understanding and returned demonstration    HOME EXERCISE PROGRAM:  Access Code: [email or web address] URL: [email or web address] Date: 02/04/2023 Prepared by: Ria Comment  Exercises - Mini Squat with Counter Support  - 2 x daily - 7 x weekly - 2-3 sets - 10 reps - Hooklying Isometric Clamshell  - 2 x daily - 7 x weekly - 2-3 sets - 10 reps - 10s on/10s off hold - Seated Isometric Hip Abduction (Mirrored)  - 2 x daily - 7 x weekly - 2-3 sets - 10 reps - 10s on/10s off hold - Supine Bridge with Resistance Band  - 2 x daily - 7 x weekly - 2-3 sets - 10 reps - Seated Isometric Hip Adduction with Ball  - 2 x daily - 7 x weekly - 2-3 sets - 10 reps - Hip Abduction with Resistance Loop  - 2 x daily - 7 x weekly - 2-3 sets - 10 reps - Hip Extension with Resistance Loop  - 2 x daily - 7 x weekly - 2-3 sets - 10 reps - Side Stepping with Resistance at Thighs  - 1 x daily - 7 x weekly - 2-3 sets - 10 reps - Lateral Step Down  - 1 x daily - 7 x weekly - 2-3 sets - 10-12 reps - Standing Hip Flexion March  - 1 x daily - 7 x weekly - 2-3 sets - 10-12 reps  Gluteus medius  tendinopathy positioning and activity modification; Green, blue, and gray tbands;    ASSESSMENT:  CLINICAL IMPRESSION: Patient arrived to physical therapy highly motivated to increase LE strength and decrease pain. Patient has demonstrated significant improvements in LE strength in gluteal and thigh musculature. He has began to walk community distances without use of AD. Today he tolerated increased intensity and dual tasking activities without report of additional pain. PT updated functional outcomes in today's visit; pt has reported improvements in functional mobility. Currently he still has limitations with stairs due to pain and walking longer distances. Based on today's performance, PT plans to continue to continue skilled physical therapy at 1x/wk in order to improve gait, strength, and pain in order to improve function at home and with leisure activities such as hiking.    OBJECTIVE IMPAIRMENTS: Abnormal gait, decreased strength, and pain.   ACTIVITY LIMITATIONS: bending, sitting, squatting, and transfers  PARTICIPATION LIMITATIONS: cleaning, driving, shopping, and community activity  PERSONAL FACTORS: 3+ comorbidities: CLL, OSA, OA, and neuropathy  are also affecting patient's functional outcome.   REHAB POTENTIAL: Excellent  CLINICAL DECISION MAKING: Evolving/moderate complexity  EVALUATION COMPLEXITY: Moderate   GOALS: Goals reviewed with patient? No  SHORT TERM GOALS: Target date: 08/13/2022   Pt will be independent with HEP in order to improve strength and decrease hip pain to improve pain-free function at home. Baseline:  Goal status: ACHIEVED   LONG TERM GOALS: Target date: 02/25/2023    Pt will increase FOTO to at least 58 to demonstrate significant improvement in function at home and with leisure activities related to hip pain  Baseline: 07/02/22: 31; 10/04/22: 36; 11/19/22: 40; 02/22/2023: 60 Goal status: Goal Met   2.  Pt will decrease worst hip pain by at least  3 points on the NPRS in order to demonstrate clinically significant reduction in hip pain. Baseline: 07/01/21: worst: 7/10; 10/04/22: 5/10 (regularly with walking 2-3/10); 11/19/22: 6/10; 02/04/2023: 3/10; 02/22/2023: 3/10 Goal status: PARTIALLY MET  3.  Pt will increase HOOS Jr score by at least 18 points in order demonstrate clinically significant improvement in R hip pain and function      Baseline: 07/02/22: To be completed; 07/09/22: 11/28, Interval Score: 55.99/100; 10/04/22: 55.99/100; 11/19/22: 11 / 28, Interval Score: 55.985/100; HOOS, Jr. Score: 10 / 28, Interval Score: 58.93 / 100; 02/22/2023: HOOS, Jr. Score: 6 / 28, Interval Score: 70.426 / 100 Goal status: ONGOING  4.  Pt will report at least 75% improvement in his hip symptoms in order to return to walking and hiking without notable increase in his pain.  Baseline: 10/04/22: 70% improvement; 11/19/22: 70% improvement; 02/04/2023: 80%; 02/22/2023: 85% Goal status: Goal Met    PLAN: PT FREQUENCY: 1-2x/week  PT DURATION: 6 weeks  PLANNED INTERVENTIONS: Therapeutic exercises, Therapeutic activity, Neuromuscular re-education, Balance training, Gait training, Patient/Family education, Self Care, Joint mobilization, Joint manipulation, Vestibular training, Canalith repositioning, Orthotic/Fit training, DME instructions, Dry Needling, Electrical stimulation, Spinal manipulation, Spinal mobilization, Cryotherapy, Moist heat, Taping, Traction, Ultrasound, Ionotophoresis 4mg /ml Dexamethasone, Manual therapy, and Re-evaluation.  PLAN FOR NEXT SESSION:  manual techniques for R lateral/posterior hip, review and modify HEP as necessary, progress strengthening and normalize gait;   Sharalyn Ink Kara Mierzejewski PT, DPT, GCS  03/04/2023 1:08 PM

## 2023-03-07 ENCOUNTER — Ambulatory Visit: Payer: Medicare Other

## 2023-03-07 DIAGNOSIS — M25551 Pain in right hip: Secondary | ICD-10-CM

## 2023-03-07 DIAGNOSIS — R262 Difficulty in walking, not elsewhere classified: Secondary | ICD-10-CM

## 2023-03-07 DIAGNOSIS — M6281 Muscle weakness (generalized): Secondary | ICD-10-CM

## 2023-03-22 ENCOUNTER — Ambulatory Visit: Payer: Medicare Other

## 2023-06-11 IMAGING — MR MR CERVICAL SPINE W/O CM
5 series · 40 of 48 positions shown · non-contrast
Comparison: MRI examination dated January 11, 2010

CLINICAL DATA: Chronic neck pain. Pain goes down the right arm and
legs. Numbness and tingling.

EXAM:
MRI CERVICAL SPINE WITHOUT CONTRAST
TECHNIQUE: Multiplanar, multisequence MR imaging of the cervical spine was
performed. No intravenous contrast was administered.

[Series 5: T2 · sagittal · 3.0mm · 0.62mm/px · 6 of 15 slices shown (1 of 2)]
[im 1/15]
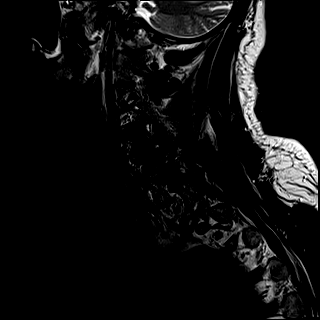
[im 3/15]
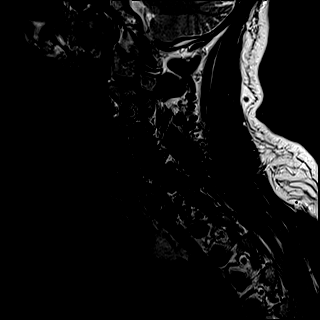
[im 6/15]
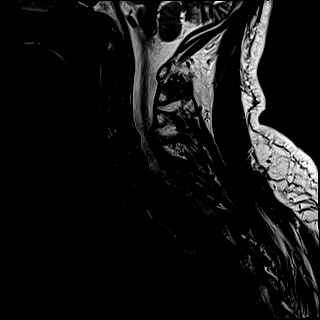
[im 9/15]
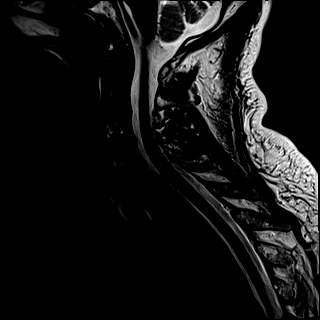
[im 12/15]
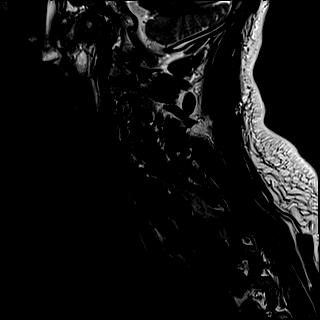
[im 15/15]
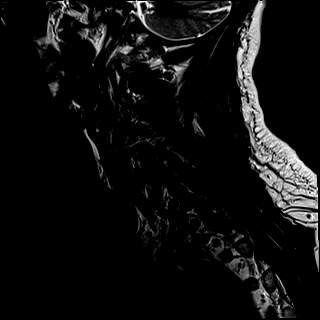

[Series 6: FLAIR · sagittal · 3.0mm · 0.78mm/px · 7 of 15 slices shown]
[im 1/15]
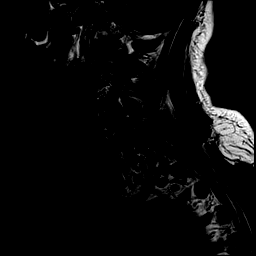
[im 3/15]
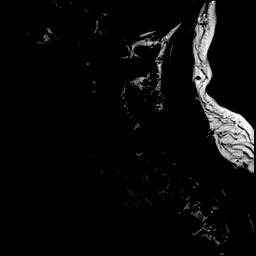
[im 5/15]
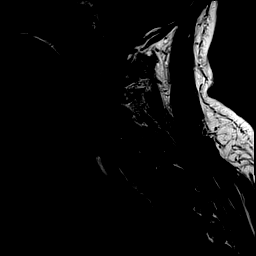
[im 8/15]
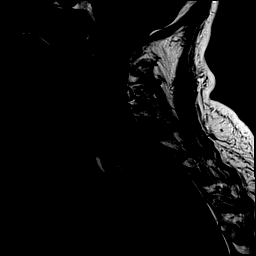
[im 10/15]
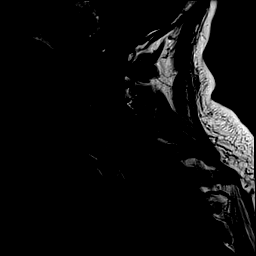
[im 12/15]
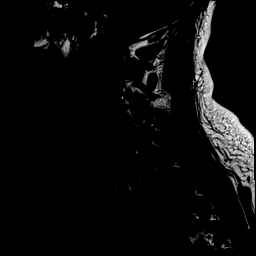
[im 15/15]
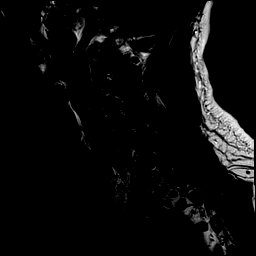

[Series 7: STIR · sagittal · 3.0mm · 0.62mm/px · 7 of 15 slices shown]
[im 1/15]
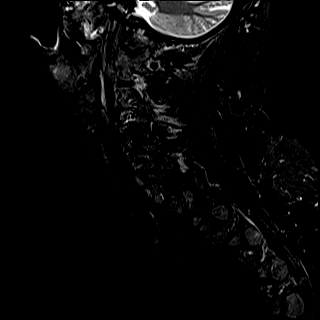
[im 3/15]
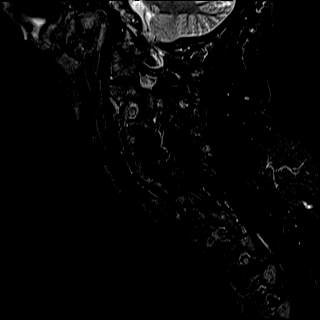
[im 5/15]
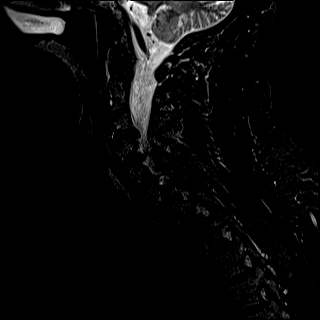
[im 8/15]
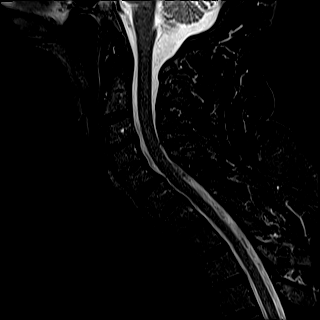
[im 10/15]
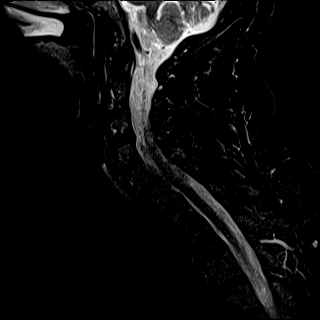
[im 12/15]
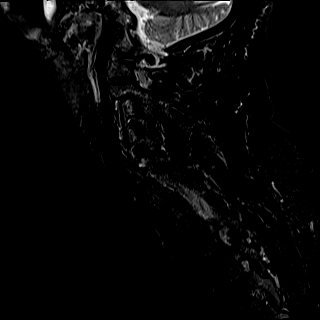
[im 15/15]
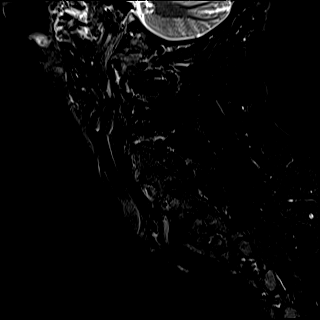

[Series 8: T2 · axial · 3.0mm · 0.70mm/px · z∈[-130,-40]mm · 12 of 29 slices shown (2 of 2)]
[im 1/29]
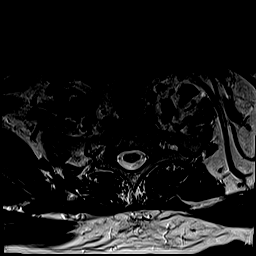
[im 3/29]
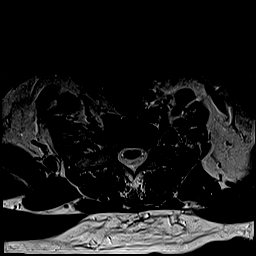
[im 5/29]
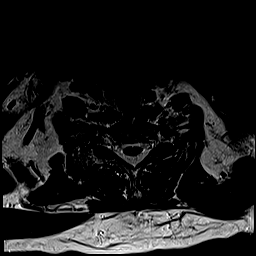
[im 7/29]
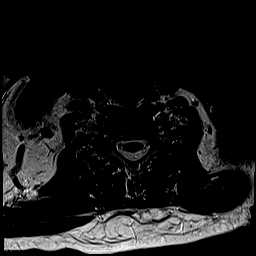
[im 9/29]
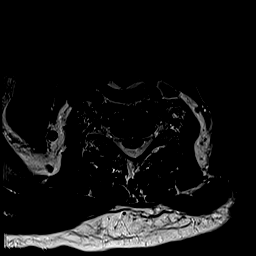
[im 11/29]
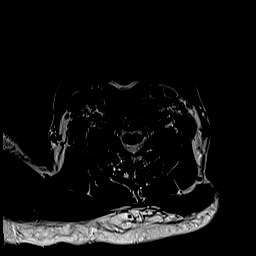
[im 13/29]
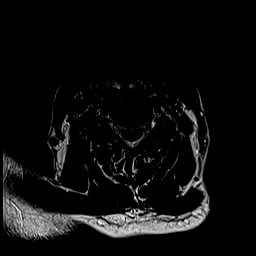
[im 16/29]
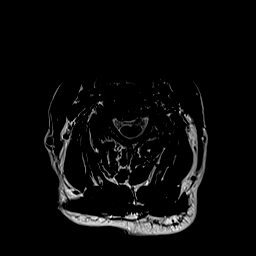
[im 18/29]
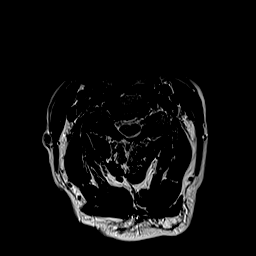
[im 20/29]
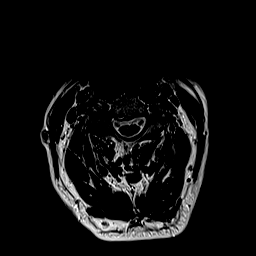
[im 24/29]
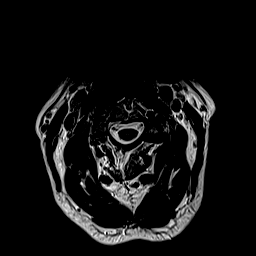
[im 29/29]
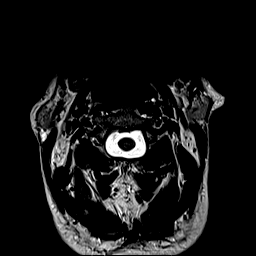

[Series 9: ax mpgr · axial · 3.0mm · 0.35mm/px · z∈[-130,-40]mm · 8 of 29 slices shown]
[im 1/29]
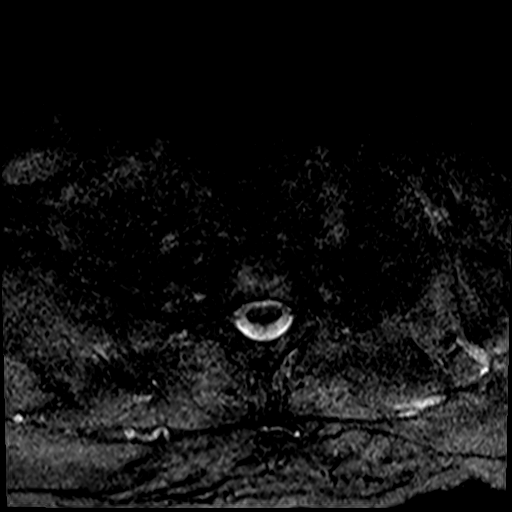
[im 5/29]
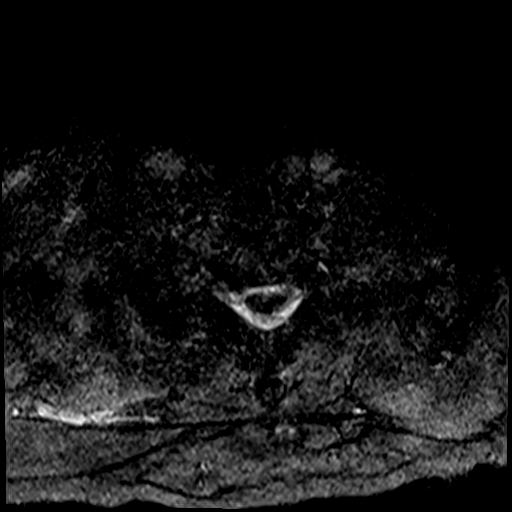
[im 9/29]
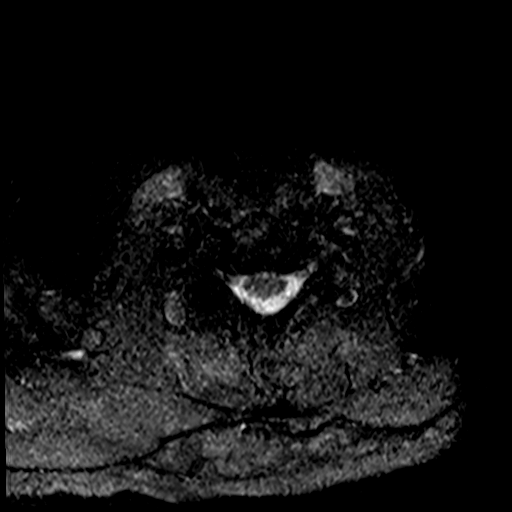
[im 13/29]
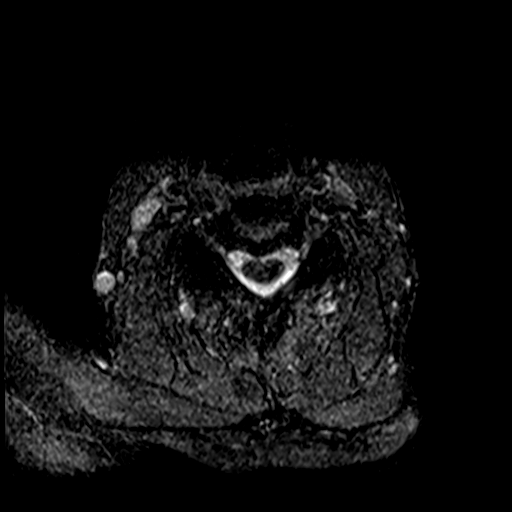
[im 16/29]
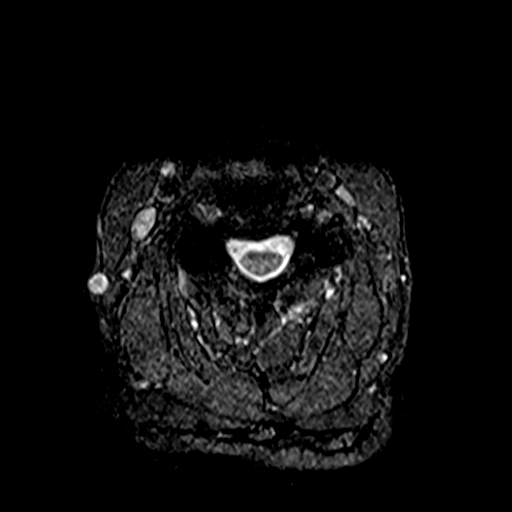
[im 20/29]
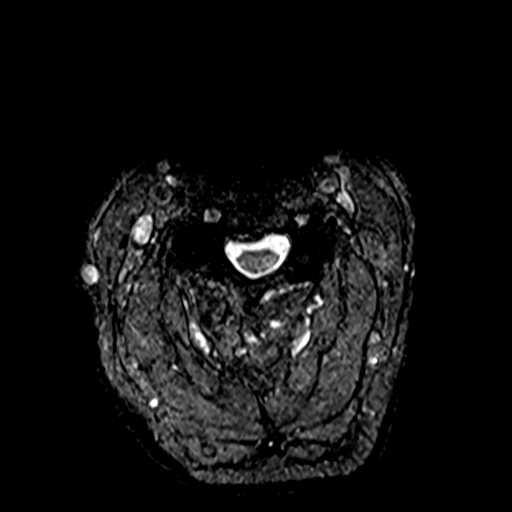
[im 24/29]
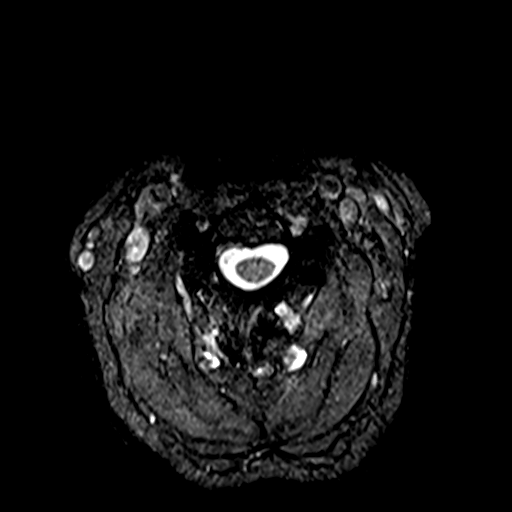
[im 29/29]
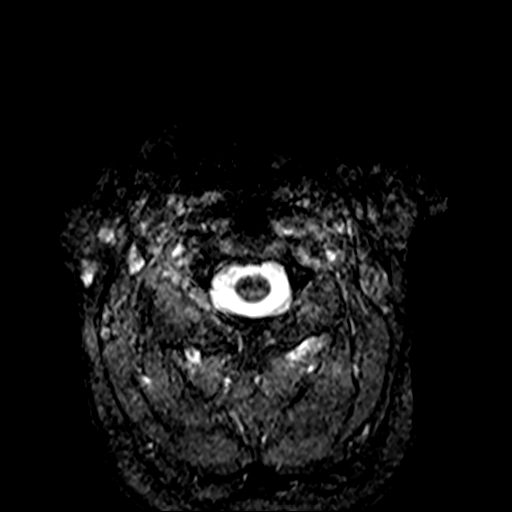

[40 of 48 positions shown; findings below may reference images not displayed]

FINDINGS: Alignment: Grade 1 anterolisthesis of C5.

Vertebrae: No fracture, evidence of discitis, or bone lesion.

Cord: Normal signal and morphology.

Posterior Fossa, vertebral arteries, paraspinal tissues: Negative.

Disc levels:

C2-3: No significant disc bulge. No neural foraminal stenosis. No
central canal stenosis.

C3-4: Asymmetric disc protrusion to the right with mild narrowing of
the right neural foramen. No significant spinal canal stenosis.

C4-5: Asymmetric disc protrusion to the right with moderate right
and mild left neural foraminal narrowing.

C5-6: Circumferential disc protrusion without significant spinal
canal or neural foraminal narrowing

C6-7: Disc osteophyte complex with mild spinal canal narrowing. No
significant neural foraminal narrowing.

C7-T1: No significant disc bulge. No neural foraminal stenosis. No
central canal stenosis.
IMPRESSION: 1.  No acute fracture.  Grade 1 anterolisthesis of C5.

2. Degenerate disc disease prominent at C3-C4 and C4-C5. Moderate
stenosis of the right neural foramen at C4-C5.

## 2023-08-19 ENCOUNTER — Ambulatory Visit: Attending: Orthopedic Surgery | Admitting: Occupational Therapy

## 2023-08-19 ENCOUNTER — Encounter: Payer: Self-pay | Admitting: Occupational Therapy

## 2023-08-19 DIAGNOSIS — M6281 Muscle weakness (generalized): Secondary | ICD-10-CM | POA: Diagnosis present

## 2023-08-19 DIAGNOSIS — L905 Scar conditions and fibrosis of skin: Secondary | ICD-10-CM | POA: Insufficient documentation

## 2023-08-19 DIAGNOSIS — M25641 Stiffness of right hand, not elsewhere classified: Secondary | ICD-10-CM | POA: Diagnosis present

## 2023-08-19 DIAGNOSIS — M79641 Pain in right hand: Secondary | ICD-10-CM | POA: Insufficient documentation

## 2023-08-19 NOTE — Therapy (Unsigned)
 OUTPATIENT OCCUPATIONAL THERAPY ORTHO EVALUATION  Patient Name: Chad Gaines MRN: [id number] DOB:[birth year], 78 y.o., male Today's Date: 08/21/2023  PCP: Firman Hughes  REFERRING PROVIDER: Molli Angelucci  END OF SESSION:  OT End of Session - 08/21/23 1947     Visit Number 1    Number of Visits 13    Date for OT Re-Evaluation 09/30/23    OT Start Time 0900    OT Stop Time 0945    OT Time Calculation (min) 45 min    Activity Tolerance Patient tolerated treatment well    Behavior During Therapy WFL for tasks assessed/performed             Past Medical History:  Diagnosis Date   Allergy to alpha-gal    GERD (gastroesophageal reflux disease)    Hypertension    Pinched nerve    some numbness R arm, R ankle   Sleep apnea    CPAP   Past Surgical History:  Procedure Laterality Date   APPENDECTOMY     CATARACT EXTRACTION W/PHACO Left 07/03/2021   Procedure: CATARACT EXTRACTION PHACO AND INTRAOCULAR LENS PLACEMENT (IOC) LEFT VIVITY TORIC 3.59 00:27.8;  Surgeon: Rosa College, MD;  Location: [facility];  Service: Ophthalmology;  Laterality: Left;  sleep apnea   CATARACT EXTRACTION W/PHACO Right 07/17/2021   Procedure: CATARACT EXTRACTION PHACO AND INTRAOCULAR LENS PLACEMENT (IOC) RIGHT VIVITY TORIC 4.20 00:33.2;  Surgeon: Rosa College, MD;  Location: [facility];  Service: Ophthalmology;  Laterality: Right;  sleep apnea   NASAL SEPTUM SURGERY     Patient Active Problem List   Diagnosis Date Noted   Difficulty in walking, not elsewhere classified 07/18/2022   Adult hypothyroidism 12/25/2014   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   Apnea, sleep 12/25/2014   Essential (primary) hypertension 08/05/2014   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HCC) 03/08/2014   Chronic lymphocytic leukemia (HCC) 03/08/2014    ONSET DATE: 08/01/2023  REFERRING DIAG: s/p trigger finger release middle finger of the right hand  THERAPY DIAG:  Pain in right hand  Stiffness of  right hand, not elsewhere classified  Muscle weakness (generalized)  Scar condition and fibrosis of skin  Rationale for Evaluation and Treatment: Rehabilitation  SUBJECTIVE:   SUBJECTIVE STATEMENT: Pt reports he had progressive pain and numbness in the right ring and middle finger for several months.  He went for surgery and has persistent pain and stiffness in the right hand.    Pt accompanied by: self  PERTINENT HISTORY: Chad Gaines is a 78 year old male who is s/p two trigger finger releases  on 06/26/2023 with persistent symptoms.   PRECAUTIONS: None  RED FLAGS: None   WEIGHT BEARING RESTRICTIONS: No  PAIN:  Are you having pain? Yes: NPRS scale: 5/10 Pain location: right hand, palm Pain description: Pt reports persistent pain greater in the mornings, tender to touch Aggravating factors: with use Relieving factors: massage  FALLS: Has patient fallen in last 6 months? No  LIVING ENVIRONMENT: Lives with: lives with their family and lives with their spouse Lives in: House/apartment Stairs: Yes: Internal: 18 steps; on right going up Has following equipment at home: None  PLOF: Independent  PATIENT GOALS: Pt reports he would like to have a pain free hand and be able to perform daily tasks without difficulty .  NEXT MD VISIT: unsure  OBJECTIVE:  Note: Objective measures were completed at Evaluation unless otherwise noted.  HAND DOMINANCE: Right  ADLs: Overall ADLs: Pt able to complete basic ADLs  with effort, mild difficulty with buttoning (slower), opening jars and containers, improving with tying shoes and managing clothing which was difficult initially.   Pt would like to be able to complete yard work tasks including management and functional use of tools:  shovel, rake, hand spade.    FUNCTIONAL OUTCOME MEASURES: TBD  UPPER EXTREMITY ROM:     Active ROM Right eval Left eval  Shoulder flexion 140 140  Shoulder abduction    Shoulder adduction     Shoulder extension    Shoulder internal rotation    Shoulder external rotation    Elbow flexion WNL WNL  Elbow extension WNL WNL  Wrist flexion 70 70  Wrist extension 60 70  Wrist ulnar deviation    Wrist radial deviation    Wrist pronation WNL WNL  Wrist supination WNL WNL  (Blank rows = not tested)  Active ROM Right eval Left eval  Thumb MCP (0-60)    Thumb IP (0-80)    Thumb Radial abd/add (0-55)     Thumb Palmar abd/add (0-45)     Thumb Opposition to Small Finger Tightness to SF WNL   Index MCP (0-90)     Index PIP (0-100)     Index DIP (0-70)      Long MCP (0-90)      Long PIP (0-100)      Long DIP (0-70)      Ring MCP (0-90)      Ring PIP (0-100)      Ring DIP (0-70)      Little MCP (0-90)      Little PIP (0-100)      Little DIP (0-70)      (Blank rows = not tested) Pt with full composite fisting bilaterally but reports stiffness with movement on right, reports pain at the top of the right fingers.  UPPER EXTREMITY MMT:     MMT Right eval Left eval  Shoulder flexion 4/5 4/5  Shoulder abduction 4/5 4/5  Shoulder adduction    Shoulder extension    Shoulder internal rotation    Shoulder external rotation    Middle trapezius    Lower trapezius    Elbow flexion 4/5 5/5  Elbow extension    Wrist flexion 4/5 5/5  Wrist extension 4/5 5/5  Wrist ulnar deviation    Wrist radial deviation    Wrist pronation    Wrist supination    (Blank rows = not tested)  HAND FUNCTION: NT at eval 08/21/23 Grip strength: Right:   lbs; Left:   lbs, Lateral pinch: Right:   lbs, Left:   lbs, and 3 point pinch: Right:   lbs, Left:   lbs  COORDINATION: Impaired coordination and hand function with increased pain and stiffness  SENSATION: Denies any numbness or tingling in hand.  EDEMA: Edema noted in right hand in palm.  COGNITION: Overall cognitive status: Within functional limits for tasks assessed   TREATMENT DATE: 08/19/2023  Modalities: Contrast bath:  Time: 8 mins Location: right hand  To decrease pain, edema and inflammation. Issued written instructions for contrast for home program, to perform 2-3 times a day ending with warm prior to performing exercises and scar massage.  Manual therapy:   Scar massage to right palm at middle and ring finger, pt instructed on performing at home daily 2-3 times a day. Issued CICA care scar pad to use at night for scar management to provide hydration and promote additional healing.  Therapeutic Exercises:  Pt seen for tendon gliding exercises for ROM, opposition.   PATIENT EDUCATION: Education details: use of contrast for edema control, tendon gliding exercises, scar massage Person educated: Patient Education method: Explanation, Demonstration, and Handouts Education comprehension: verbalized understanding and needs further education  HOME EXERCISE PROGRAM: Pt instructed on use of contrast for edema management, ROM with tendon gliding exercises and scar massage.  GOALS: Goals reviewed with patient? Yes  SHORT TERM GOALS: Target date: 09/09/2023  Pt to demonstrate independence in performing home program Baseline: no current program Goal status: INITIAL   LONG TERM GOALS: Target date: 09/30/2023  Pt will demonstrate increase in functional grip strength to manage and open jars and containers with ease. Baseline: difficulty with opening jars and containers. Goal status: INITIAL  2.  Pt will demonstrate ability to hold yard tools in right hand with secure grasp.   Baseline: difficulty holding tools and using functionally. Goal status: INITIAL  3.  Pt will demonstrate ability to perform yard work with pain 2/10 or less. Baseline: 5/10 pain at rest and increased with use. Goal status: INITIAL  4.  Pt to demonstrate managing buttons with normal speed and ease Baseline: difficulty  at times and requires increased time to complete Goal status: INITIAL  ASSESSMENT:  CLINICAL IMPRESSION: Patient is a 77 y.o. male who was seen today for occupational therapy evaluation for s/p right trigger finger release of middle and ring fingers on 06/26/2023 with persistent symptoms and pain.  Pt presents with increased pain in right hand, edema, decreased overall functional use, decreased strength in functional grip and pinch skills, and decreased ability to perform ADL and IADL tasks.  Pt would benefit from skilled OT services to maximize safety and independence in necessary daily tasks.    PERFORMANCE DEFICITS: in functional skills including ADLs, IADLs, coordination, dexterity, edema, ROM, strength, pain, fascial restrictions, flexibility, Fine motor control, and UE functional use, and psychosocial skills including environmental adaptation, habits, and routines and behaviors.   IMPAIRMENTS: are limiting patient from ADLs, IADLs, and leisure.   COMORBIDITIES: may have co-morbidities  that affects occupational performance. Patient will benefit from skilled OT to address above impairments and improve overall function.  MODIFICATION OR ASSISTANCE TO COMPLETE EVALUATION: Min-Moderate modification of tasks or assist with assess necessary to complete an evaluation.  OT OCCUPATIONAL PROFILE AND HISTORY: Detailed assessment: Review of records and additional review of physical, cognitive, psychosocial history related to current functional performance.  CLINICAL DECISION MAKING: Moderate - several treatment options, min-mod task modification necessary  REHAB POTENTIAL: Good  EVALUATION COMPLEXITY: Moderate    PLAN:  OT FREQUENCY: 1-2x/week  OT DURATION: 6 weeks  PLANNED INTERVENTIONS: 97168 OT Re-evaluation, 97535 self care/ADL training, 16109 therapeutic exercise, 97530 therapeutic activity, 97112 neuromuscular re-education, 97140 manual therapy, 97035 ultrasound, 97018 paraffin, 60454  fluidotherapy, 97010 moist heat, 97010 cryotherapy, 97034 contrast bath, scar mobilization, patient/family education, and DME and/or AE instructions  RECOMMENDED OTHER SERVICES: none  CONSULTED AND AGREED WITH PLAN OF CARE:  Patient  PLAN FOR NEXT SESSION: continued focus on edema management, ROM and scar management, add to home exercise program  Antone Batten, OTR/L, CLT 08/21/2023, 8:17 PM

## 2023-08-22 ENCOUNTER — Ambulatory Visit: Admitting: Occupational Therapy

## 2023-08-22 DIAGNOSIS — M6281 Muscle weakness (generalized): Secondary | ICD-10-CM

## 2023-08-22 DIAGNOSIS — M25641 Stiffness of right hand, not elsewhere classified: Secondary | ICD-10-CM

## 2023-08-22 DIAGNOSIS — M79641 Pain in right hand: Secondary | ICD-10-CM | POA: Diagnosis not present

## 2023-08-22 DIAGNOSIS — L905 Scar conditions and fibrosis of skin: Secondary | ICD-10-CM

## 2023-08-22 NOTE — Therapy (Signed)
 OUTPATIENT OCCUPATIONAL THERAPY ORTHO TREATMENT  Patient Name: Chad Gaines MRN: [id number] DOB:1945/11/24, 78 y.o., male Today's Date: 08/22/2023  PCP: Firman Hughes  REFERRING PROVIDER: Molli Angelucci  END OF SESSION:  OT End of Session - 08/22/23 1157     Visit Number 2    Number of Visits 13    Date for OT Re-Evaluation 09/30/23    OT Start Time 0914    OT Stop Time 0950    OT Time Calculation (min) 36 min    Activity Tolerance Patient tolerated treatment well    Behavior During Therapy WFL for tasks assessed/performed             Past Medical History:  Diagnosis Date   Allergy to alpha-gal    GERD (gastroesophageal reflux disease)    Hypertension    Pinched nerve    some numbness R arm, R ankle   Sleep apnea    CPAP   Past Surgical History:  Procedure Laterality Date   APPENDECTOMY     CATARACT EXTRACTION W/PHACO Left 07/03/2021   Procedure: CATARACT EXTRACTION PHACO AND INTRAOCULAR LENS PLACEMENT (IOC) LEFT VIVITY TORIC 3.59 00:27.8;  Surgeon: Rosa College, MD;  Location: 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  Motion discomfort and pain in middle and base of third digit.  Pt accompanied by: self  PERTINENT HISTORY: Chad Gaines is a 78 year old male who is s/p two trigger finger releases  on 06/26/2023 with persistent symptoms.   PRECAUTIONS: None  RED FLAGS: None   WEIGHT BEARING RESTRICTIONS: No  PAIN:  Are you having pain?  3-4/10 at third PIP proximal phalanges and over A1 pulley FALLS: Has patient fallen in last 6 months? No  LIVING ENVIRONMENT: Lives with: lives with their family and lives with their spouse Lives in: House/apartment Stairs: Yes: Internal: 18 steps; on right going up Has following equipment at home: None  PLOF: Independent  PATIENT GOALS: Pt reports he would like to have a pain free hand and be able to perform daily tasks without difficulty .  NEXT MD VISIT: unsure  OBJECTIVE:  Note: Objective measures were completed at Evaluation unless otherwise noted.  HAND DOMINANCE: Right  ADLs: Overall ADLs: Pt able to complete basic ADLs with effort, mild difficulty with buttoning (slower), opening jars and containers, improving with tying shoes and managing clothing which was difficult initially.  Pt would like to be able to complete yard work tasks  including management and functional use of tools:  shovel, rake, hand spade.    FUNCTIONAL OUTCOME MEASURES: TBD  UPPER EXTREMITY ROM:     Active ROM Right eval Left eval  Shoulder flexion 140 140  Shoulder abduction    Shoulder adduction    Shoulder extension    Shoulder internal rotation    Shoulder external rotation    Elbow flexion WNL WNL  Elbow extension WNL WNL  Wrist flexion 70 70  Wrist extension 60 70  Wrist ulnar deviation    Wrist radial  deviation    Wrist pronation WNL WNL  Wrist supination WNL WNL  (Blank rows = not tested)  Active ROM Right eval Left eval  Thumb MCP (0-60)    Thumb IP (0-80)    Thumb Radial abd/add (0-55)     Thumb Palmar abd/add (0-45)     Thumb Opposition to Small Finger Tightness to SF WNL   Index MCP (0-90)     Index PIP (0-100)     Index DIP (0-70)      Long MCP (0-90)      Long PIP (0-100)      Long DIP (0-70)      Ring MCP (0-90)      Ring PIP (0-100)      Ring DIP (0-70)      Little MCP (0-90)      Little PIP (0-100)      Little DIP (0-70)      (Blank rows = not tested) At eval -Pt with full composite fisting bilaterally but reports stiffness with movement on right, reports pain at the top of the right fingers.  UPPER EXTREMITY MMT:     MMT Right eval Left eval  Shoulder flexion 4/5 4/5  Shoulder abduction 4/5 4/5  Shoulder adduction    Shoulder extension    Shoulder internal rotation    Shoulder external rotation    Middle trapezius    Lower trapezius    Elbow flexion 4/5 5/5  Elbow extension    Wrist flexion 4/5 5/5  Wrist extension 4/5 5/5  Wrist ulnar deviation    Wrist radial deviation    Wrist pronation    Wrist supination    (Blank rows = not tested)  HAND FUNCTION: NT at eval 08/21/23 Grip strength: Right:   lbs; Left:   lbs, Lateral pinch: Right:   lbs, Left:   lbs, and 3 point pinch: Right:   lbs, Left:   lbs  COORDINATION: Impaired coordination and hand function with increased pain and stiffness  SENSATION: Denies any numbness or tingling in hand.  EDEMA: Edema noted in right hand in palm.  COGNITION: Overall cognitive status: Within functional limits for tasks assessed   TREATMENT DATE: 08/22/2023                                                                                                                             Patient arrived with increased  flexion of digits and right hand.  Extension within functional limits but discomfort and some  scar adhesion with hyperextension of third MCP as well as composite extension. Tenderness on third digit lateral bands of PIP as well as proximal phalanges into volar plate. Patient to continue with Cica -Care scar pad at nighttime  Modalities: Fluidotherapy Time: 8 mins Location: right hand and wrist To decrease pain, edema and inflammation.  Alternate 2 cycles of ice for 1 minute Reinforced with patient to do in the mornings as well as in the middle of the day and before bedtime contrast to decrease edema increase motion decrease pain   Manual therapy:   Scar massage to right palm at middle and ring finger scars using extractor as well as mini massager this date in combination with with third digit flexion and extension,  pt instructed on performing at home daily 2-3 times a day. Encouraged patient to only do 3 times a day not frequently. Continue CICA care scar pad to use at night for scar management to provide hydration and promote additional healing. Did fit patient with Isotoner glove for nighttime use to decrease edema.  As well as silicone sleeve for third digit for daytime on and off to decrease edema and decrease pain.  Reviewed with patient and at some soft tissue massage with digit extension over right roller relaxing into slight hyperextension.  Pain-free 20 reps Followed by gentle pressure stretches 10 reps for composite extension Prior to tendon gliding exercises for ROM,  opposition. 10 reps 2-3 times a day   PATIENT EDUCATION: Education details: use of contrast for edema control, tendon gliding exercises, scar massage Person educated: Patient Education method: Explanation, Demonstration, and Handouts Education comprehension: verbalized understanding and needs further education  HOME EXERCISE PROGRAM: Pt instructed on use of contrast for edema management, ROM with tendon gliding exercises and scar massage.  GOALS: Goals reviewed with patient? Yes  SHORT TERM  GOALS: Target date: 09/09/2023  Pt to demonstrate independence in performing home program Baseline: no current program Goal status: INITIAL   LONG TERM GOALS: Target date: 09/30/2023  Pt will demonstrate increase in functional grip strength to manage and open jars and containers with ease. Baseline: difficulty with opening jars and containers. Goal status: INITIAL  2.  Pt will demonstrate ability to hold yard tools in right hand with secure grasp.   Baseline: difficulty holding tools and using functionally. Goal status: INITIAL  3.  Pt will demonstrate ability to perform yard work with pain 2/10 or less. Baseline: 5/10 pain at rest and increased with use. Goal status: INITIAL  4.  Pt to demonstrate managing buttons with normal speed and ease Baseline: difficulty at times and requires increased time to complete Goal status: INITIAL  ASSESSMENT:  CLINICAL IMPRESSION: Patient seen and occupational therapy for s/p right trigger finger release of middle and ring fingers on 06/26/2023 with persistent symptoms and pain.  At evaluation patient presents  with increased pain in right 3rd and 4th digits, edema, decreased overall functional use, decreased strength in functional grip and pinch skills, and decreased ability to perform ADL and IADL tasks.  This date at second visit patient with increased active range of motion as well as decreased pain.  Most discomfort is in the third PIP and proximal phalanges as well as volar plate.  Patient was fitted with a Isotoner glove as well as a silicone sleeve to decrease edema and pain.  As well as adding digit extension and composite extension with  wrist to home exercises.  Pt would benefit from skilled OT services to maximize safety and independence in necessary daily tasks.    PERFORMANCE DEFICITS: in functional skills including ADLs, IADLs, coordination, dexterity, edema, ROM, strength, pain, fascial restrictions, flexibility, Fine motor control, and  UE functional use, and psychosocial skills including environmental adaptation, habits, and routines and behaviors.   IMPAIRMENTS: are limiting patient from ADLs, IADLs, and leisure.   COMORBIDITIES: may have co-morbidities  that affects occupational performance. Patient will benefit from skilled OT to address above impairments and improve overall function.  MODIFICATION OR ASSISTANCE TO COMPLETE EVALUATION: Min-Moderate modification of tasks or assist with assess necessary to complete an evaluation.  OT OCCUPATIONAL PROFILE AND HISTORY: Detailed assessment: Review of records and additional review of physical, cognitive, psychosocial history related to current functional performance.  CLINICAL DECISION MAKING: Moderate - several treatment options, min-mod task modification necessary  REHAB POTENTIAL: Good  EVALUATION COMPLEXITY: Moderate    PLAN:  OT FREQUENCY: 1-2x/week  OT DURATION: 6 weeks  PLANNED INTERVENTIONS: 97168 OT Re-evaluation, 97535 self care/ADL training, 21308 therapeutic exercise, 97530 therapeutic activity, 97112 neuromuscular re-education, 97140 manual therapy, 97035 ultrasound, 97018 paraffin, 65784 fluidotherapy, 97010 moist heat, 97010 cryotherapy, 97034 contrast bath, scar mobilization, patient/family education, and DME and/or AE instructions  RECOMMENDED OTHER SERVICES: none  CONSULTED AND AGREED WITH PLAN OF CARE: Patient  PLAN FOR NEXT SESSION: continued focus on edema management, ROM and scar management, add to home exercise program  Amy T Lovett, OTR/L, CLT 08/22/2023, 11:58 AM

## 2023-08-27 ENCOUNTER — Ambulatory Visit: Admitting: Occupational Therapy

## 2023-08-27 DIAGNOSIS — M79641 Pain in right hand: Secondary | ICD-10-CM

## 2023-08-27 DIAGNOSIS — M6281 Muscle weakness (generalized): Secondary | ICD-10-CM

## 2023-08-27 DIAGNOSIS — M25641 Stiffness of right hand, not elsewhere classified: Secondary | ICD-10-CM

## 2023-08-27 DIAGNOSIS — L905 Scar conditions and fibrosis of skin: Secondary | ICD-10-CM

## 2023-08-27 NOTE — Therapy (Signed)
 OUTPATIENT OCCUPATIONAL THERAPY ORTHO TREATMENT  Patient Name: Chad Gaines MRN: [id number] DOB:May 12, 1945, 78 y.o., male Today's Date: 08/27/2023  PCP: Firman Hughes  REFERRING PROVIDER: Molli Angelucci  END OF SESSION:  OT End of Session - 08/27/23 0907     Visit Number 3    Number of Visits 13    Date for OT Re-Evaluation 09/30/23    OT Start Time 0908    OT Stop Time 0950    OT Time Calculation (min) 42 min    Activity Tolerance Patient tolerated treatment well    Behavior During Therapy WFL for tasks assessed/performed             Past Medical History:  Diagnosis Date   Allergy to alpha-gal    GERD (gastroesophageal reflux disease)    Hypertension    Pinched nerve    some numbness R arm, R ankle   Sleep apnea    CPAP   Past Surgical History:  Procedure Laterality Date   APPENDECTOMY     CATARACT EXTRACTION W/PHACO Left 07/03/2021   Procedure: CATARACT EXTRACTION PHACO AND INTRAOCULAR LENS PLACEMENT (IOC) LEFT VIVITY TORIC 3.59 00:27.8;  Surgeon: Rosa College, MD;  Location: [facility];  Service: Ophthalmology;  Laterality: Left;  sleep apnea   CATARACT EXTRACTION W/PHACO Right 07/17/2021   Procedure: CATARACT EXTRACTION PHACO AND INTRAOCULAR LENS PLACEMENT (IOC) RIGHT VIVITY TORIC 4.20 00:33.2;  Surgeon: Rosa College, MD;  Location: [facility];  Service: Ophthalmology;  Laterality: Right;  sleep apnea   NASAL SEPTUM SURGERY     Patient Active Problem List   Diagnosis Date Noted   Difficulty in walking, not elsewhere classified 07/18/2022   Adult hypothyroidism 12/25/2014   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   Apnea, sleep 12/25/2014   Essential (primary) hypertension 08/05/2014   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HCC) 03/08/2014   Chronic lymphocytic leukemia (HCC) 03/08/2014    ONSET DATE: 06/26/2023  REFERRING DIAG: s/p trigger finger release middle finger of the right hand  THERAPY DIAG:  Pain in right hand  Stiffness of  right hand, not elsewhere classified  Muscle weakness (generalized)  Scar condition and fibrosis of skin  Rationale for Evaluation and Treatment: Rehabilitation  SUBJECTIVE:   SUBJECTIVE STATEMENT: Patient report glove is working at nighttime for swelling as well as not able to make a fist.  Digit sleeve during the day is working well.  Do reports the little tightness stiffness with composite fist.  And some discomfort pain over the dorsal 4th and 3rd digits with extension.  Pt accompanied by: self  PERTINENT HISTORY: Chad Gaines is a 78 year old male who is s/p two trigger finger releases  on 06/26/2023 with persistent symptoms.   PRECAUTIONS: None  RED FLAGS: None   WEIGHT BEARING RESTRICTIONS: No  PAIN:  Are you having pain?  Some discomfort pain no number stated over dorsal 4th and 3rd digit with extension FALLS: Has patient fallen in last 6 months? No  LIVING ENVIRONMENT: Lives with: lives with their family and lives with their spouse Lives in: House/apartment Stairs: Yes: Internal: 18 steps; on right going up Has following equipment at home: None  PLOF: Independent  PATIENT GOALS: Pt reports he would like to have a pain free hand and be able to perform daily tasks without difficulty .  NEXT MD VISIT: unsure  OBJECTIVE:  Note: Objective measures were completed at Evaluation unless otherwise noted.  HAND DOMINANCE: Right  ADLs: Overall ADLs: Pt able to complete basic  ADLs with effort, mild difficulty with buttoning (slower), opening jars and containers, improving with tying shoes and managing clothing which was difficult initially.  Pt would like to be able to complete yard work tasks including management and functional use of tools:  shovel, rake, hand spade.    FUNCTIONAL OUTCOME MEASURES: TBD  UPPER EXTREMITY ROM:     Active ROM Right eval Left eval  Shoulder flexion 140 140  Shoulder abduction    Shoulder adduction    Shoulder extension     Shoulder internal rotation    Shoulder external rotation    Elbow flexion WNL WNL  Elbow extension WNL WNL  Wrist flexion 70 70  Wrist extension 60 70  Wrist ulnar deviation    Wrist radial deviation    Wrist pronation WNL WNL  Wrist supination WNL WNL  (Blank rows = not tested)  Active ROM Right eval Left eval R 08/27/23  Thumb MCP (0-60)     Thumb IP (0-80)     Thumb Radial abd/add (0-55)      Thumb Palmar abd/add (0-45)      Thumb Opposition to Small Finger Tightness to SF WNL    Index MCP (0-90)      Index PIP (0-100)      Index DIP (0-70)       Long MCP (0-90)     90  Long PIP (0-100)     90  Long DIP (0-70)     70  Ring MCP (0-90)       Ring PIP (0-100)       Ring DIP (0-70)       Little MCP (0-90)       Little PIP (0-100)       Little DIP (0-70)       (Blank rows = not tested) At eval -Pt with full composite fisting bilaterally but reports stiffness with movement on right, reports pain at the top of the right fingers.  UPPER EXTREMITY MMT:     MMT Right eval Left eval  Shoulder flexion 4/5 4/5  Shoulder abduction 4/5 4/5  Shoulder adduction    Shoulder extension    Shoulder internal rotation    Shoulder external rotation    Middle trapezius    Lower trapezius    Elbow flexion 4/5 5/5  Elbow extension    Wrist flexion 4/5 5/5  Wrist extension 4/5 5/5  Wrist ulnar deviation    Wrist radial deviation    Wrist pronation    Wrist supination    (Blank rows = not tested)  HAND FUNCTION: NT at eval 08/21/23 Grip strength: Right:   lbs; Left:   lbs, Lateral pinch: Right:   lbs, Left:   lbs, and 3 point pinch: Right:   lbs, Left:   lbs  COORDINATION: Impaired coordination and hand function with increased pain and stiffness  SENSATION: Denies any numbness or tingling in hand.  EDEMA: Edema noted in right hand in palm.  COGNITION: Overall cognitive status: Within functional limits for tasks assessed   TREATMENT DATE: 08/27/2023  Patient arrived with increased flexion of digit in right hand. PIP 90 - cont to have some edema    Extension within functional limits but discomfort over dorsal 3rd and 4th digits with extention and some scar adhesion with hyperextension of third MCP as well as composite extension 3rd more than 4th - but decrease with doing only motion and not forcing it. Less Tenderness on third digit lateral bands of PIP as well as proximal phalanges into volar plate compare to last time . Patient to continue with Cica -Care scar pad at nighttime- new one provided  Modalities: Fluidotherapy Time: 8 mins Location: right hand and wrist To decrease pain, stiffness Reinforced with patient to do in the mornings as well as in the middle of the day and before bedtime contrast to decrease edema increase motion decrease pain   Manual therapy:   Scar massage to right palm at middle and ring finger scars using  mini massager this date in combination with with third digit flexion and extension and coban ,  pt instructed on performing at home daily 2-3 times a day. Encouraged patient to only do 3 times a day not frequently. Continue CICA care scar pad to use at night for scar management to provide hydration and promote additional healing. Did fit patient with Isotoner glove for nighttime use to decrease edema last visit.  As well as silicone sleeve for third digit for daytime on and off to decrease edema and decrease pain. Did not new one to replace  Reviewed with patient and at some soft tissue massage with digit extension over right roller relaxing into slight hyperextension.  Pain-free 20 reps - Motion only - not force Followed by gentle prayer stretches 10 reps for composite extension Prior to tendon gliding exercises for ROM,  opposition. 10 reps 2-3 times a day   PATIENT EDUCATION: Education details: use of  contrast for edema control, tendon gliding exercises, scar massage Person educated: Patient Education method: Explanation, Demonstration, and Handouts Education comprehension: verbalized understanding and needs further education  HOME EXERCISE PROGRAM: Pt instructed on use of contrast for edema management, ROM with tendon gliding exercises and scar massage.  GOALS: Goals reviewed with patient? Yes  SHORT TERM GOALS: Target date: 09/09/2023  Pt to demonstrate independence in performing home program Baseline: no current program Goal status: INITIAL   LONG TERM GOALS: Target date: 09/30/2023  Pt will demonstrate increase in functional grip strength to manage and open jars and containers with ease. Baseline: difficulty with opening jars and containers. Goal status: INITIAL  2.  Pt will demonstrate ability to hold yard tools in right hand with secure grasp.   Baseline: difficulty holding tools and using functionally. Goal status: INITIAL  3.  Pt will demonstrate ability to perform yard work with pain 2/10 or less. Baseline: 5/10 pain at rest and increased with use. Goal status: INITIAL  4.  Pt to demonstrate managing buttons with normal speed and ease Baseline: difficulty at times and requires increased time to complete Goal status: INITIAL  ASSESSMENT:  CLINICAL IMPRESSION: Patient seen and occupational therapy for s/p right trigger finger release of middle and ring fingers on 06/26/2023 with persistent symptoms and pain.  At evaluation patient presents  with increased pain in right 3rd and 4th digits, edema, decreased overall functional use, decreased strength in functional grip and pinch skills, and decreased ability to perform ADL and IADL tasks.  This date  patient with increased active range of motion as well as decreased pain.  Most discomfort is in the third  and 4th dorsal digits coming in -but with motion and session - no pain mostly still tightness and stiffness for  composite flexion and exention .  Patient to cont with  Isotoner glove as well as a silicone sleeve to decrease edema and pain.  Cont with tendon glides with  digit extension and composite extension with wrist prior to tendon glides  Pt would benefit from skilled OT services to maximize safety and independence in necessary daily tasks.    PERFORMANCE DEFICITS: in functional skills including ADLs, IADLs, coordination, dexterity, edema, ROM, strength, pain, fascial restrictions, flexibility, Fine motor control, and UE functional use, and psychosocial skills including environmental adaptation, habits, and routines and behaviors.   IMPAIRMENTS: are limiting patient from ADLs, IADLs, and leisure.   COMORBIDITIES: may have co-morbidities  that affects occupational performance. Patient will benefit from skilled OT to address above impairments and improve overall function.  MODIFICATION OR ASSISTANCE TO COMPLETE EVALUATION: Min-Moderate modification of tasks or assist with assess necessary to complete an evaluation.  OT OCCUPATIONAL PROFILE AND HISTORY: Detailed assessment: Review of records and additional review of physical, cognitive, psychosocial history related to current functional performance.  CLINICAL DECISION MAKING: Moderate - several treatment options, min-mod task modification necessary  REHAB POTENTIAL: Good  EVALUATION COMPLEXITY: Moderate    PLAN:  OT FREQUENCY: 1-2x/week  OT DURATION: 6 weeks  PLANNED INTERVENTIONS: 97168 OT Re-evaluation, 97535 self care/ADL training, 10258 therapeutic exercise, 97530 therapeutic activity, 97112 neuromuscular re-education, 97140 manual therapy, 97035 ultrasound, 97018 paraffin, 52778 fluidotherapy, 97010 moist heat, 97010 cryotherapy, 97034 contrast bath, scar mobilization, patient/family education, and DME and/or AE instructions  RECOMMENDED OTHER SERVICES: none  CONSULTED AND AGREED WITH PLAN OF CARE: Patient  PLAN FOR NEXT SESSION:  continued focus on edema management, ROM and scar management, add to home exercise program  Amy T Lovett, OTR/L, CLT 08/27/2023, 11:17 AM

## 2023-08-29 ENCOUNTER — Ambulatory Visit: Admitting: Occupational Therapy

## 2023-08-29 DIAGNOSIS — M79641 Pain in right hand: Secondary | ICD-10-CM

## 2023-08-29 DIAGNOSIS — M6281 Muscle weakness (generalized): Secondary | ICD-10-CM

## 2023-08-29 DIAGNOSIS — M25641 Stiffness of right hand, not elsewhere classified: Secondary | ICD-10-CM

## 2023-08-29 DIAGNOSIS — L905 Scar conditions and fibrosis of skin: Secondary | ICD-10-CM

## 2023-08-29 NOTE — Therapy (Signed)
 OUTPATIENT OCCUPATIONAL THERAPY ORTHO TREATMENT  Patient Name: Chad Gaines MRN: [id number] DOB:[birth year], 78 y.o., male Today's Date: 08/29/2023  PCP: Chad Gaines  REFERRING PROVIDER: Molli Gaines  END OF SESSION:  OT End of Session - 08/29/23 0903     Visit Number 4    Number of Visits 13    Date for OT Re-Evaluation 09/30/23    OT Start Time 0903    OT Stop Time 0939    OT Time Calculation (min) 36 min    Activity Tolerance Patient tolerated treatment well    Behavior During Therapy WFL for tasks assessed/performed             Past Medical History:  Diagnosis Date   Allergy to alpha-gal    GERD (gastroesophageal reflux disease)    Hypertension    Pinched nerve    some numbness R arm, R ankle   Sleep apnea    CPAP   Past Surgical History:  Procedure Laterality Date   APPENDECTOMY     CATARACT EXTRACTION W/PHACO Left 07/03/2021   Procedure: CATARACT EXTRACTION PHACO AND INTRAOCULAR LENS PLACEMENT (IOC) LEFT VIVITY TORIC 3.59 00:27.8;  Surgeon: Chad College, MD;  Location: [facility];  Service: Ophthalmology;  Laterality: Left;  sleep apnea   CATARACT EXTRACTION W/PHACO Right 07/17/2021   Procedure: CATARACT EXTRACTION PHACO AND INTRAOCULAR LENS PLACEMENT (IOC) RIGHT VIVITY TORIC 4.20 00:33.2;  Surgeon: Chad College, MD;  Location: [facility];  Service: Ophthalmology;  Laterality: Right;  sleep apnea   NASAL SEPTUM SURGERY     Patient Active Problem List   Diagnosis Date Noted   Difficulty in walking, not elsewhere classified 07/18/2022   Adult hypothyroidism 12/25/2014   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   Apnea, sleep 12/25/2014   Essential (primary) hypertension 08/05/2014   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HCC) 03/08/2014   Chronic lymphocytic leukemia (HCC) 03/08/2014    ONSET DATE: 06/26/2023  REFERRING DIAG: s/p trigger finger release middle finger of the right hand  THERAPY DIAG:  Pain in right hand  Stiffness of  right hand, not elsewhere classified  Muscle weakness (generalized)  Scar condition and fibrosis of skin  Rationale for Evaluation and Treatment: Rehabilitation  SUBJECTIVE:   SUBJECTIVE STATEMENT: Patient report glove is working at nighttime for swelling as well as not able to make a fist.  Digit sleeve during the day is working well. Report pain better - still some tenderness over the joints - but less tightness - except in middle finger -and then squeezing still some discomfort   Pt accompanied by: self  PERTINENT HISTORY: Chad Gaines is a 78 year old male who is s/p two trigger finger releases  on 06/26/2023 with persistent symptoms.   PRECAUTIONS: None  RED FLAGS: None   WEIGHT BEARING RESTRICTIONS: No  PAIN:  Are you having pain?  Tightness and stiffness intrinsic and composite fist - more than pain  FALLS: Has patient fallen in last 6 months? No  LIVING ENVIRONMENT: Lives with: lives with their family and lives with their spouse Lives in: House/apartment Stairs: Yes: Internal: 18 steps; on right going up Has following equipment at home: None  PLOF: Independent  PATIENT GOALS: Pt reports he would like to have a pain free hand and be able to perform daily tasks without difficulty .  NEXT MD VISIT: unsure  OBJECTIVE:  Note: Objective measures were completed at Evaluation unless otherwise noted.  HAND DOMINANCE: Right  ADLs: Overall ADLs: Pt able to complete basic  ADLs with effort, mild difficulty with buttoning (slower), opening jars and containers, improving with tying shoes and managing clothing which was difficult initially.  Pt would like to be able to complete yard work tasks including management and functional use of tools:  shovel, rake, hand spade.    FUNCTIONAL OUTCOME MEASURES: TBD  UPPER EXTREMITY ROM:     Active ROM Right eval Left eval  Shoulder flexion 140 140  Shoulder abduction    Shoulder adduction    Shoulder extension    Shoulder  internal rotation    Shoulder external rotation    Elbow flexion WNL WNL  Elbow extension WNL WNL  Wrist flexion 70 70  Wrist extension 60 70  Wrist ulnar deviation    Wrist radial deviation    Wrist pronation WNL WNL  Wrist supination WNL WNL  (Blank rows = not tested)  Active ROM Right eval Left eval R 08/27/23 R 08/29/23  Thumb MCP (0-60)      Thumb IP (0-80)      Thumb Radial abd/add (0-55)       Thumb Palmar abd/add (0-45)       Thumb Opposition to Small Finger Tightness to SF WNL     Index MCP (0-90)       Index PIP (0-100)       Index DIP (0-70)        Long MCP (0-90)     90 90  Long PIP (0-100)     90 95  Long DIP (0-70)     70 70  Ring MCP (0-90)        Ring PIP (0-100)        Ring DIP (0-70)        Little MCP (0-90)        Little PIP (0-100)        Little DIP (0-70)        (Blank rows = not tested) At eval -Pt with full composite fisting bilaterally but reports stiffness with movement on right, reports pain at the top of the right fingers.  UPPER EXTREMITY MMT:     MMT Right eval Left eval  Shoulder flexion 4/5 4/5  Shoulder abduction 4/5 4/5  Shoulder adduction    Shoulder extension    Shoulder internal rotation    Shoulder external rotation    Middle trapezius    Lower trapezius    Elbow flexion 4/5 5/5  Elbow extension    Wrist flexion 4/5 5/5  Wrist extension 4/5 5/5  Wrist ulnar deviation    Wrist radial deviation    Wrist pronation    Wrist supination    (Blank rows = not tested)  HAND FUNCTION: NT at eval 08/21/23 08/29/23 Grip strength: Right: 20 lbs; Left: 60 lbs, Lateral pinch: Right: 19 lbs, Left: 22 lbs, and 3 point pinch: Right: 14 lbs, Left: 13 lbs  COORDINATION: Impaired coordination and hand function with increased pain and stiffness at eval  But improving  in all   SENSATION: Denies any numbness or tingling in hand.  EDEMA: Edema noted in right hand in palm.  COGNITION: Overall cognitive status: Within functional limits  for tasks assessed   TREATMENT DATE: 08/29/2023  Patient arrived with increased flexion of 3rd digit in right hand. PIP 95 - cont to have some edema but improving   Extension within functional limits and improving - less discomfort over dorsal 3rd and 4th digits with extention and  scar adhesion improving - increase  hyperextension of third MCP as well as composite extension 3rd and 4th   Reinforce to cont with motion - avoid gripping or squeezing that cause pain Less Tenderness on third digit lateral bands of PIP as well as proximal phalanges into volar plate compare  Patient to continue with Cica -Care scar pad at nighttime- new one provided  Modalities: Fluidotherapy Time: 8 mins Location: right hand and wrist To decrease pain, stiffness Reinforced with patient to do in the mornings as well as in the middle of the day and before bedtime contrast to decrease edema increase motion decrease pain   Manual therapy:   Scar massage to right palm at middle and ring finger scars using  mini massager this date in combination with with third digit flexion and extension and coban , also done extractor and manual by OT  pt instructed on performing at home daily 2-3 times a day. Encouraged patient to only do 3 times a day not frequently. Continue CICA care scar pad to use at night for scar management to provide hydration and promote additional healing. patient  to cont with Isotoner glove for nighttime use to decrease edema last visit.  As well as silicone sleeve for third digit for daytime on and off to decrease edema and decrease pain. Did not new one to replace  Reviewed with patient and at some soft tissue massage with digit extension over right roller relaxing into slight hyperextension.  Pain-free 20 reps - Motion only - not force Followed by gentle prayer stretches 10  reps for composite extension Add this date table slides - 20 reps = 2-3 x day  Prior to tendon gliding exercises for ROM,  opposition. 10 reps 2-3 times a day   PATIENT EDUCATION: Education details: use of contrast for edema control, tendon gliding exercises, scar massage Person educated: Patient Education method: Explanation, Demonstration, and Handouts Education comprehension: verbalized understanding and needs further education  HOME EXERCISE PROGRAM: Pt instructed on use of contrast for edema management, ROM with tendon gliding exercises and scar massage.  GOALS: Goals reviewed with patient? Yes  SHORT TERM GOALS: Target date: 09/09/2023  Pt to demonstrate independence in performing home program Baseline: no current program Goal status: INITIAL   LONG TERM GOALS: Target date: 09/30/2023  Pt will demonstrate increase in functional grip strength to manage and open jars and containers with ease. Baseline: difficulty with opening jars and containers. Goal status: INITIAL  2.  Pt will demonstrate ability to hold yard tools in right hand with secure grasp.   Baseline: difficulty holding tools and using functionally. Goal status: INITIAL  3.  Pt will demonstrate ability to perform yard work with pain 2/10 or less. Baseline: 5/10 pain at rest and increased with use. Goal status: INITIAL  4.  Pt to demonstrate managing buttons with normal speed and ease Baseline: difficulty at times and requires increased time to complete Goal status: INITIAL  ASSESSMENT:  CLINICAL IMPRESSION: Patient seen and occupational therapy for s/p right trigger finger release of middle and ring fingers on 06/26/2023 with persistent symptoms and pain.  At evaluation patient presents  with increased pain in right 3rd and 4th digits, edema, decreased overall functional use, decreased strength in functional  grip and pinch skills, and decreased ability to perform ADL and IADL tasks.  Pt show every session  progress in active range of motion as well as decreased pain.  Less pain and discomfort -mostly some tightness still in 3rd with intrinsic and composite fist - when force and grip tight or squeeze -  Patient to cont with  Isotoner glove as well as a silicone sleeve to decrease edema and pain.  Cont with tendon glides with  digit extension and composite extension with wrist prior to tendon glides  -Pt would benefit from skilled OT services to maximize safety and independence in necessary daily tasks.    PERFORMANCE DEFICITS: in functional skills including ADLs, IADLs, coordination, dexterity, edema, ROM, strength, pain, fascial restrictions, flexibility, Fine motor control, and UE functional use, and psychosocial skills including environmental adaptation, habits, and routines and behaviors.   IMPAIRMENTS: are limiting patient from ADLs, IADLs, and leisure.   COMORBIDITIES: may have co-morbidities  that affects occupational performance. Patient will benefit from skilled OT to address above impairments and improve overall function.  MODIFICATION OR ASSISTANCE TO COMPLETE EVALUATION: Min-Moderate modification of tasks or assist with assess necessary to complete an evaluation.  OT OCCUPATIONAL PROFILE AND HISTORY: Detailed assessment: Review of records and additional review of physical, cognitive, psychosocial history related to current functional performance.  CLINICAL DECISION MAKING: Moderate - several treatment options, min-mod task modification necessary  REHAB POTENTIAL: Good  EVALUATION COMPLEXITY: Moderate    PLAN:  OT FREQUENCY: 1-2x/week  OT DURATION: 6 weeks  PLANNED INTERVENTIONS: 97168 OT Re-evaluation, 97535 self care/ADL training, 14782 therapeutic exercise, 97530 therapeutic activity, 97112 neuromuscular re-education, 97140 manual therapy, 97035 ultrasound, 97018 paraffin, 95621 fluidotherapy, 97010 moist heat, 97010 cryotherapy, 97034 contrast bath, scar mobilization,  patient/family education, and DME and/or AE instructions  RECOMMENDED OTHER SERVICES: none  CONSULTED AND AGREED WITH PLAN OF CARE: Patient  PLAN FOR NEXT SESSION: continued focus on edema management, ROM and scar management, add to home exercise program  [employer] Preez OTR/L,CLT 08/29/2023, 9:40 AM

## 2023-09-02 ENCOUNTER — Ambulatory Visit: Admitting: Occupational Therapy

## 2023-09-02 DIAGNOSIS — M6281 Muscle weakness (generalized): Secondary | ICD-10-CM

## 2023-09-02 DIAGNOSIS — M79641 Pain in right hand: Secondary | ICD-10-CM | POA: Diagnosis not present

## 2023-09-02 DIAGNOSIS — M25641 Stiffness of right hand, not elsewhere classified: Secondary | ICD-10-CM

## 2023-09-02 DIAGNOSIS — L905 Scar conditions and fibrosis of skin: Secondary | ICD-10-CM

## 2023-09-02 NOTE — Therapy (Signed)
 OUTPATIENT OCCUPATIONAL THERAPY ORTHO TREATMENT  Patient Name: Chad Gaines MRN: [id number] DOB:[birth year], 78 y.o., male Today's Date: 09/02/2023  PCP: Chad Gaines  REFERRING PROVIDER: Molli Gaines  END OF SESSION:  OT End of Session - 09/02/23 1036     Visit Number 5    Number of Visits 13    Date for OT Re-Evaluation 09/30/23    OT Start Time 1036    OT Stop Time 1118    OT Time Calculation (min) 42 min    Activity Tolerance Patient tolerated treatment well    Behavior During Therapy WFL for tasks assessed/performed             Past Medical History:  Diagnosis Date   Allergy to alpha-gal    GERD (gastroesophageal reflux disease)    Hypertension    Pinched nerve    some numbness R arm, R ankle   Sleep apnea    CPAP   Past Surgical History:  Procedure Laterality Date   APPENDECTOMY     CATARACT EXTRACTION W/PHACO Left 07/03/2021   Procedure: CATARACT EXTRACTION PHACO AND INTRAOCULAR LENS PLACEMENT (IOC) LEFT VIVITY TORIC 3.59 00:27.8;  Surgeon: Chad College, MD;  Location: [facility];  Service: Ophthalmology;  Laterality: Left;  sleep apnea   CATARACT EXTRACTION W/PHACO Right 07/17/2021   Procedure: CATARACT EXTRACTION PHACO AND INTRAOCULAR LENS PLACEMENT (IOC) RIGHT VIVITY TORIC 4.20 00:33.2;  Surgeon: Chad College, MD;  Location: [facility];  Service: Ophthalmology;  Laterality: Right;  sleep apnea   NASAL SEPTUM SURGERY     Patient Active Problem List   Diagnosis Date Noted   Difficulty in walking, not elsewhere classified 07/18/2022   Adult hypothyroidism 12/25/2014   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  Able to use his hand more with less pain.  Was able to open the refrigerator door.  Can cut food.  Still a little tender to palpation without middle joint.  And little tight.  Pt accompanied by: self  PERTINENT HISTORY: Chad Gaines is a 78 year old male who is s/p two trigger finger releases  on 06/26/2023 with persistent symptoms.   PRECAUTIONS: None  RED FLAGS: None   WEIGHT BEARING RESTRICTIONS: No  PAIN:  Are you having pain?  Tightness and stiffness intrinsic and composite fist - more than pain  FALLS: Has patient fallen in last 6 months? No  LIVING ENVIRONMENT: Lives with: lives with their family and lives with their spouse Lives in: House/apartment Stairs: Yes: Internal: 18 steps; on right going up Has following equipment at home: None  PLOF: Independent  PATIENT GOALS: Pt reports he would like to have a pain free hand and be able to perform daily tasks without difficulty .  NEXT MD VISIT: unsure  OBJECTIVE:  Note: Objective measures were completed at Evaluation unless otherwise noted.  HAND DOMINANCE: Right  ADLs: Overall ADLs: Pt able to complete basic ADLs with effort, mild difficulty with buttoning (slower), opening jars and  containers, improving with tying shoes and managing clothing which was difficult initially.  Pt would like to be able to complete yard work tasks including management and functional use of tools:  shovel, rake, hand spade.    FUNCTIONAL OUTCOME MEASURES: TBD  UPPER EXTREMITY ROM:     Active ROM Right eval Left eval  Shoulder flexion 140 140  Shoulder abduction    Shoulder adduction    Shoulder extension    Shoulder internal rotation    Shoulder external rotation    Elbow flexion WNL WNL   Elbow extension WNL WNL  Wrist flexion 70 70  Wrist extension 60 70  Wrist ulnar deviation    Wrist radial deviation    Wrist pronation WNL WNL  Wrist supination WNL WNL  (Blank rows = not tested)  Active ROM Right eval Left eval R 08/27/23 R 08/29/23  Thumb MCP (0-60)      Thumb IP (0-80)      Thumb Radial abd/add (0-55)       Thumb Palmar abd/add (0-45)       Thumb Opposition to Small Finger Tightness to SF WNL     Index MCP (0-90)       Index PIP (0-100)       Index DIP (0-70)        Long MCP (0-90)     90 90  Long PIP (0-100)     90 95  Long DIP (0-70)     70 70  Ring MCP (0-90)        Ring PIP (0-100)        Ring DIP (0-70)        Little MCP (0-90)        Little PIP (0-100)        Little DIP (0-70)        (Blank rows = not tested) At eval -Pt with full composite fisting bilaterally but reports stiffness with movement on right, reports pain at the top of the right fingers.  UPPER EXTREMITY MMT:     MMT Right eval Left eval  Shoulder flexion 4/5 4/5  Shoulder abduction 4/5 4/5  Shoulder adduction    Shoulder extension    Shoulder internal rotation    Shoulder external rotation    Middle trapezius    Lower trapezius    Elbow flexion 4/5 5/5  Elbow extension    Wrist flexion 4/5 5/5  Wrist extension 4/5 5/5  Wrist ulnar deviation    Wrist radial deviation    Wrist pronation    Wrist supination    (Blank rows = not tested)  HAND FUNCTION: NT at eval 08/21/23 08/29/23 Grip strength: Right: 20 lbs; Left: 60 lbs, Lateral pinch: Right: 19 lbs, Left: 22 lbs, and 3 point pinch: Right: 14 lbs, Left: 13 lbs 09/02/23 Grip strength: Right: 54 lbs; Left: 60 lbs,   COORDINATION: Impaired coordination and hand function with increased pain and stiffness at eval  But improving  in all   SENSATION: Denies any numbness or tingling in hand.  EDEMA: Edema noted in right hand in palm.  COGNITION: Overall cognitive status: Within functional limits for tasks  assessed   TREATMENT DATE: 09/02/2023              Patient arrived with some tenderness with palpation to PIP lateral bands.  But no pain with flexion or grip and prehension strength assessment.  Patient arrived with cont  increased flexion of 3rd digit in right hand. PIP 95 - cont to have some edema but improving Grip increased from 20 to 54 lbs - less pain with pulling refrigerator  Able to hold the knife to cut food with no pain.   Extension within functional limits and improving - less discomfort over dorsal 3rd and 4th digits with extention and  scar adhesion improving - increase  hyperextension of third MCP as well as composite extension 3rd and 4th   Reinforce to cont with motion - avoid gripping or squeezing that cause pain  Patient to continue with Cica -Care scar pad at nighttime- new one provided  Modalities: Fluidotherapy Time: 8 mins Location: right hand and wrist To decrease pain, stiffness Reinforced with patient to do in the mornings contrast.  Did do 1 rotation of ice during Fluidil as well as in the middle of the day and before bedtime contrast to decrease edema increase motion decrease pain   Manual therapy:   Scar massage to right palm at middle and ring finger scars using Coban this date in combination with with third digit flexion and extension, also done extractor and manual by OT with wrist and digit extension-facilitate opposite scar mobilization  pt instructed on performing at home daily 2-3 times a day. Encouraged patient to only do 3 times a day not frequently.  As well as not causing any sliding. Continue CICA care scar pad to use at night for scar management to provide hydration and promote additional healing. patient  to cont with Isotoner glove for nighttime use to decrease edema last visit.  As well as silicone sleeve for third digit for daytime on  and off to decrease edema and decrease pain. Did not new one to replace  Reviewed with patient and at some soft tissue massage and scar massage with the wrist in extension facilitating scar mobilization opposite then digit extension.  Reinforced precautions not to slide with Coban.  But just for traction in opposite direction of digit gentle prayer stretches 10 reps for composite extension table slides - 20 reps = 2-3 x day  Prior to tendon gliding exercises for ROM,  opposition. 10 reps 2-3 times a day   PATIENT EDUCATION: Education details: use of contrast for edema control, tendon gliding exercises, scar massage Person educated: Patient Education method: Explanation, Demonstration, and Handouts Education comprehension: verbalized understanding and needs further education  HOME EXERCISE PROGRAM: Pt instructed on use of contrast for edema management, ROM with tendon gliding exercises and scar massage.  GOALS: Goals reviewed with patient? Yes  SHORT TERM GOALS: Target date: 09/09/2023  Pt to demonstrate independence in performing home program Baseline: no current program Goal status: INITIAL   LONG TERM GOALS: Target date: 09/30/2023  Pt will demonstrate increase in functional grip strength to manage and open jars and containers with ease. Baseline: difficulty with opening jars and containers. Goal status: INITIAL  2.  Pt will demonstrate ability to hold yard tools in right hand with secure grasp.   Baseline: difficulty holding tools and using functionally. Goal status: INITIAL  3.  Pt will demonstrate ability to perform yard work with pain 2/10 or less. Baseline: 5/10 pain at rest and increased with use. Goal status: INITIAL  4.  Pt to demonstrate managing buttons with normal speed and ease Baseline: difficulty at times and requires increased time to complete Goal status: INITIAL  ASSESSMENT:  CLINICAL IMPRESSION: Patient seen and occupational therapy for s/p right  trigger finger release of middle and ring fingers on 06/26/2023 with persistent symptoms and pain.  At evaluation patient presents  with increased pain in right 3rd and 4th digits, edema, decreased overall functional use, decreased strength in functional grip and pinch skills, and decreased ability to perform ADL and IADL tasks.  Pt show every session progress in active range of motion as well as decreased pain.  Less pain and discomfort -mostly some tightness still in 3rd PIP with intrinsic and composite fist - when force and grip tight or squeeze -grip improved since last time seen from 20 pounds to 54 pounds with no pain.  Patient to cont with  Isotoner glove  Cont with tendon glides with  digit extension and composite extension with wrist prior to tendon glides  -Pt would benefit from skilled OT services to maximize safety and independence in necessary daily tasks.    PERFORMANCE DEFICITS: in functional skills including ADLs, IADLs, coordination, dexterity, edema, ROM, strength, pain, fascial restrictions, flexibility, Fine motor control, and UE functional use, and psychosocial skills including environmental adaptation, habits, and routines and behaviors.   IMPAIRMENTS: are limiting patient from ADLs, IADLs, and leisure.   COMORBIDITIES: may have co-morbidities  that affects occupational performance. Patient will benefit from skilled OT to address above impairments and improve overall function.  MODIFICATION OR ASSISTANCE TO COMPLETE EVALUATION: Min-Moderate modification of tasks or assist with assess necessary to complete an evaluation.  OT OCCUPATIONAL PROFILE AND HISTORY: Detailed assessment: Review of records and additional review of physical, cognitive, psychosocial history related to current functional performance.  CLINICAL DECISION MAKING: Moderate - several treatment options, min-mod task modification necessary  REHAB POTENTIAL: Good  EVALUATION COMPLEXITY: Moderate    PLAN:  OT  FREQUENCY: 1-2x/week  OT DURATION: 6 weeks  PLANNED INTERVENTIONS: 97168 OT Re-evaluation, 97535 self care/ADL training, 16109 therapeutic exercise, 97530 therapeutic activity, 97112 neuromuscular re-education, 97140 manual therapy, 97035 ultrasound, 97018 paraffin, 60454 fluidotherapy, 97010 moist heat, 97010 cryotherapy, 97034 contrast bath, scar mobilization, patient/family education, and DME and/or AE instructions  RECOMMENDED OTHER SERVICES: none  CONSULTED AND AGREED WITH PLAN OF CARE: Patient  PLAN FOR NEXT SESSION: continued focus on edema management, ROM and scar management, add to home exercise program  [employer]reez OTR/L,CLT 09/02/2023, 11:20 AM

## 2023-09-11 ENCOUNTER — Ambulatory Visit: Admitting: Occupational Therapy

## 2023-09-17 ENCOUNTER — Ambulatory Visit: Attending: Orthopedic Surgery | Admitting: Occupational Therapy

## 2023-09-17 DIAGNOSIS — M25641 Stiffness of right hand, not elsewhere classified: Secondary | ICD-10-CM | POA: Insufficient documentation

## 2023-09-17 DIAGNOSIS — M6281 Muscle weakness (generalized): Secondary | ICD-10-CM | POA: Insufficient documentation

## 2023-09-17 DIAGNOSIS — M79641 Pain in right hand: Secondary | ICD-10-CM | POA: Diagnosis present

## 2023-09-17 DIAGNOSIS — L905 Scar conditions and fibrosis of skin: Secondary | ICD-10-CM | POA: Diagnosis present

## 2023-09-17 NOTE — Therapy (Signed)
 OUTPATIENT OCCUPATIONAL THERAPY ORTHO TREATMENT  Patient Name: Chad Gaines MRN: [id number] DOB:1945-11-15, 78 y.o., male Today's Date: 09/17/2023  PCP: Firman Hughes  REFERRING PROVIDER: Molli Angelucci  END OF SESSION:  OT End of Session - 09/17/23 0905     Visit Number 6    Number of Visits 13    Date for OT Re-Evaluation 09/30/23    OT Start Time 0805    OT Stop Time 0845    OT Time Calculation (min) 40 min    Activity Tolerance Patient tolerated treatment well    Behavior During Therapy WFL for tasks assessed/performed             Past Medical History:  Diagnosis Date   Allergy to alpha-gal    GERD (gastroesophageal reflux disease)    Hypertension    Pinched nerve    some numbness R arm, R ankle   Sleep apnea    CPAP   Past Surgical History:  Procedure Laterality Date   APPENDECTOMY     CATARACT EXTRACTION W/PHACO Left 07/03/2021   Procedure: CATARACT EXTRACTION PHACO AND INTRAOCULAR LENS PLACEMENT (IOC) LEFT VIVITY TORIC 3.59 00:27.8;  Surgeon: Rosa College, MD;  Location: [facility];  Service: Ophthalmology;  Laterality: Left;  sleep apnea   CATARACT EXTRACTION W/PHACO Right 07/17/2021   Procedure: CATARACT EXTRACTION PHACO AND INTRAOCULAR LENS PLACEMENT (IOC) RIGHT VIVITY TORIC 4.20 00:33.2;  Surgeon: Rosa College, MD;  Location: [facility];  Service: Ophthalmology;  Laterality: Right;  sleep apnea   NASAL SEPTUM SURGERY     Patient Active Problem List   Diagnosis Date Noted   Difficulty in walking, not elsewhere classified 07/18/2022   Adult hypothyroidism 12/25/2014   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   Apnea, sleep 12/25/2014   Essential (primary) hypertension 08/05/2014   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HCC) 03/08/2014   Chronic lymphocytic leukemia (HCC) 03/08/2014    ONSET DATE: 06/26/2023  REFERRING DIAG: s/p trigger finger release middle finger of the right hand  THERAPY DIAG:  Pain in right hand  Stiffness of  right hand, not elsewhere classified  Muscle weakness (generalized)  Scar condition and fibrosis of skin  Rationale for Evaluation and Treatment: Rehabilitation  SUBJECTIVE:   SUBJECTIVE STATEMENT: Doing okay but still pain with hammering and using a shovel- or when hitting my middle or ring fingers against something- the knuckles hurt.-Also when I am trying to open a jar  Pt accompanied by: self  PERTINENT HISTORY: Chad Gaines is a 78 year old male who is s/p two trigger finger releases  on 06/26/2023 with persistent symptoms.   PRECAUTIONS: None  RED FLAGS: None   WEIGHT BEARING RESTRICTIONS: No  PAIN:  Are you having pain?  No pain except grip tight or impact  FALLS: Has patient fallen in last 6 months? No  LIVING ENVIRONMENT: Lives with: lives with their family and lives with their spouse Lives in: House/apartment Stairs: Yes: Internal: 18 steps; on right going up Has following equipment at home: None  PLOF: Independent  PATIENT GOALS: Pt reports he would like to have a pain free hand and be able to perform daily tasks without difficulty .  NEXT MD VISIT: unsure  OBJECTIVE:  Note: Objective measures were completed at Evaluation unless otherwise noted.  HAND DOMINANCE: Right  ADLs: Overall ADLs: Pt able to complete basic ADLs with effort, mild difficulty with buttoning (slower), opening jars and containers, improving with tying shoes and managing clothing which was difficult initially.  Pt  would like to be able to complete yard work tasks including management and functional use of tools:  shovel, rake, hand spade.    FUNCTIONAL OUTCOME MEASURES: TBD  UPPER EXTREMITY ROM:     Active ROM Right eval Left eval  Shoulder flexion 140 140  Shoulder abduction    Shoulder adduction    Shoulder extension    Shoulder internal rotation    Shoulder external rotation    Elbow flexion WNL WNL  Elbow extension WNL WNL  Wrist flexion 70 70  Wrist extension 60 70   Wrist ulnar deviation    Wrist radial deviation    Wrist pronation WNL WNL  Wrist supination WNL WNL  (Blank rows = not tested)  Active ROM Right eval Left eval R 08/27/23 R 08/29/23  Thumb MCP (0-60)      Thumb IP (0-80)      Thumb Radial abd/add (0-55)       Thumb Palmar abd/add (0-45)       Thumb Opposition to Small Finger Tightness to SF WNL     Index MCP (0-90)       Index PIP (0-100)       Index DIP (0-70)        Long MCP (0-90)     90 90  Long PIP (0-100)     90 95  Long DIP (0-70)     70 70  Ring MCP (0-90)        Ring PIP (0-100)        Ring DIP (0-70)        Little MCP (0-90)        Little PIP (0-100)        Little DIP (0-70)        (Blank rows = not tested) At eval -Pt with full composite fisting bilaterally but reports stiffness with movement on right, reports pain at the top of the right fingers.  UPPER EXTREMITY MMT:     MMT Right eval Left eval  Shoulder flexion 4/5 4/5  Shoulder abduction 4/5 4/5  Shoulder adduction    Shoulder extension    Shoulder internal rotation    Shoulder external rotation    Middle trapezius    Lower trapezius    Elbow flexion 4/5 5/5  Elbow extension    Wrist flexion 4/5 5/5  Wrist extension 4/5 5/5  Wrist ulnar deviation    Wrist radial deviation    Wrist pronation    Wrist supination    (Blank rows = not tested)  HAND FUNCTION: NT at eval 08/21/23 08/29/23 Grip strength: Right: 20 lbs; Left: 60 lbs, Lateral pinch: Right: 19 lbs, Left: 22 lbs, and 3 point pinch: Right: 14 lbs, Left: 13 lbs 09/02/23 Grip strength: Right: 54 lbs; Left: 60 lbs,  09/17/23 Grip strength: Right: 60 lbs; Left: 60 lbs, Lateral pinch: Right: 23 lbs, Left: 22 lbs, and 3 point pinch: Right: 16 lbs, Left: 13 lbs  COORDINATION: Impaired coordination and hand function with increased pain and stiffness at eval  But improving  in all   SENSATION: Denies any numbness or tingling in hand.  EDEMA: Edema noted in right hand in  palm.  COGNITION: Overall cognitive status: Within functional limits for tasks assessed   TREATMENT DATE: 09/17/2023              Patient arrived with reports still of discomfort when doing things that takes impact.  Using a shovel or a hammer.  As well as squeezing  something tight.  Or hitting the middle third finger against object.                                                                                                           Patient digit flexion improved greatly.  Because of continued edema of 0.5 at the third PIP patient limited by 5 degrees.  Expected.   Grip and prehension strength improved greatly.   See flowsheet   Patient continues to have small scar adhesion causing some irritation to the tendon with composite fisting with applying pressure or doing something that is impact. Provided patient with silicone compression sleeves for 3rd and 4th digit to help with decreasing edema still in the PIP joints. As well as Cica -Care scar pad for nighttime. Reviewed with patient this date again scar mobilization.   Modalities: Fluidotherapy Time: 8 mins Location: right hand and wrist To decrease pain, stiffness Patient can continue with contrast in the morning.  Glove at nighttime.   Manual therapy:   Scar massage to right palm at middle and ring finger scars using Coban this date again in combination with with third digit flexion and extension, also done extractor and manual by OT with wrist and digit extension-facilitate opposite scar mobilization  pt instructed on performing at home daily 2-3 times a day. Encouraged patient to only do 3 times a day not frequently.  As well as not causing any sliding. Continue CICA care scar pad to use at night for scar management to provide hydration and promote additional healing. patient  to cont with Isotoner glove for nighttime use to decrease edema last visit.  As well as silicone sleeve for third digit for daytime on and off to  decrease edema and decrease pain. Did not new one to replace  Reviewed with patient and at some soft tissue massage and scar massage with the wrist in extension facilitating scar mobilization opposite then digit extension.  Reinforced precautions not to slide with Coban.  But just for traction in opposite direction of digit gentle prayer stretches 10 reps for composite extension table slides - 20 reps = 2-3 x day  Prior to tendon gliding exercises for ROM,  opposition. 10 reps 2-3 times a day   PATIENT EDUCATION: Education details: use of contrast for edema control, tendon gliding exercises, scar massage Person educated: Patient Education method: Explanation, Demonstration, and Handouts Education comprehension: verbalized understanding and needs further education  HOME EXERCISE PROGRAM: Pt instructed on use of contrast for edema management, ROM with tendon gliding exercises and scar massage.  GOALS: Goals reviewed with patient? Yes  SHORT TERM GOALS: Target date: 09/09/2023  Pt to demonstrate independence in performing home program Baseline: no current program Goal status: INITIAL   LONG TERM GOALS: Target date: 09/30/2023  Pt will demonstrate increase in functional grip strength to manage and open jars and containers with ease. Baseline: difficulty with opening jars and containers. Goal status: INITIAL  2.  Pt will demonstrate ability to hold yard tools in right hand with secure grasp.   Baseline:  difficulty holding tools and using functionally. Goal status: INITIAL  3.  Pt will demonstrate ability to perform yard work with pain 2/10 or less. Baseline: 5/10 pain at rest and increased with use. Goal status: INITIAL  4.  Pt to demonstrate managing buttons with normal speed and ease Baseline: difficulty at times and requires increased time to complete Goal status: INITIAL  ASSESSMENT:  CLINICAL IMPRESSION: Patient seen and occupational therapy for s/p right trigger  finger release of middle and ring fingers on 06/26/2023 with persistent symptoms and pain.  At evaluation patient presents  with increased pain in right 3rd and 4th digits, edema, decreased overall functional use, decreased strength in functional grip and pinch skills, and decreased ability to perform ADL and IADL tasks.  Pt show every session progress in active range of motion as well as decreased pain.  Patient grip and prehension strength improved greatly.  Patient continues to have some edema in the 3rd and 4th digits with decrease of 5 degrees and composite flexion as expected.  Small scar adhesion-with reports of still some discomfort with applying pressure with impact activities like hammering or using a shovel.  That is expected.  Patient to continue with home program for 3 to 4 weeks.  And can follow-up with me if needed.  Patient to cont with  Isotoner glove  Cont with tendon glides with  digit extension and composite extension with wrist prior to tendon glides  -Pt would benefit from skilled OT services to maximize safety and independence in necessary daily tasks.    PERFORMANCE DEFICITS: in functional skills including ADLs, IADLs, coordination, dexterity, edema, ROM, strength, pain, fascial restrictions, flexibility, Fine motor control, and UE functional use, and psychosocial skills including environmental adaptation, habits, and routines and behaviors.   IMPAIRMENTS: are limiting patient from ADLs, IADLs, and leisure.   COMORBIDITIES: may have co-morbidities  that affects occupational performance. Patient will benefit from skilled OT to address above impairments and improve overall function.  MODIFICATION OR ASSISTANCE TO COMPLETE EVALUATION: Min-Moderate modification of tasks or assist with assess necessary to complete an evaluation.  OT OCCUPATIONAL PROFILE AND HISTORY: Detailed assessment: Review of records and additional review of physical, cognitive, psychosocial history related to current  functional performance.  CLINICAL DECISION MAKING: Moderate - several treatment options, min-mod task modification necessary  REHAB POTENTIAL: Good  EVALUATION COMPLEXITY: Moderate    PLAN:  OT FREQUENCY: 1-2x/week  OT DURATION: 6 weeks  PLANNED INTERVENTIONS: 97168 OT Re-evaluation, 97535 self care/ADL training, 82956 therapeutic exercise, 97530 therapeutic activity, 97112 neuromuscular re-education, 97140 manual therapy, 97035 ultrasound, 97018 paraffin, 21308 fluidotherapy, 97010 moist heat, 97010 cryotherapy, 97034 contrast bath, scar mobilization, patient/family education, and DME and/or AE instructions  RECOMMENDED OTHER SERVICES: none  CONSULTED AND AGREED WITH PLAN OF CARE: Patient  PLAN FOR NEXT SESSION: continued focus on edema management, ROM and scar management, add to home exercise program  [employer] Preez OTR/L,CLT 09/17/2023, 1:14 PM

## 2023-09-19 ENCOUNTER — Ambulatory Visit: Admitting: Occupational Therapy

## 2023-09-23 ENCOUNTER — Ambulatory Visit: Admitting: Occupational Therapy

## 2023-09-26 ENCOUNTER — Ambulatory Visit: Admitting: Occupational Therapy

## 2023-10-08 ENCOUNTER — Ambulatory Visit: Admitting: Occupational Therapy

## 2023-10-08 DIAGNOSIS — M79641 Pain in right hand: Secondary | ICD-10-CM | POA: Diagnosis not present

## 2023-10-08 DIAGNOSIS — M25641 Stiffness of right hand, not elsewhere classified: Secondary | ICD-10-CM

## 2023-10-08 DIAGNOSIS — L905 Scar conditions and fibrosis of skin: Secondary | ICD-10-CM

## 2023-10-08 DIAGNOSIS — M6281 Muscle weakness (generalized): Secondary | ICD-10-CM

## 2023-10-08 NOTE — Therapy (Signed)
 OUTPATIENT OCCUPATIONAL THERAPY ORTHO TREATMENT/RECERT  Patient Name: Chad Gaines MRN: [id number] DOB:1946/04/08, 78 y.o., male Today's Date: 10/08/2023  PCP: Cleotilde Anes  REFERRING PROVIDER: Kathlynn Sharper  END OF SESSION:  OT End of Session - 10/08/23 0819     Visit Number 7    Number of Visits 9    Date for OT Re-Evaluation 12/31/23    OT Start Time 0818    OT Stop Time 0900    OT Time Calculation (min) 42 min    Activity Tolerance Patient tolerated treatment well    Behavior During Therapy WFL for tasks assessed/performed          Past Medical History:  Diagnosis Date   Allergy to alpha-gal    GERD (gastroesophageal reflux disease)    Hypertension    Pinched nerve    some numbness R arm, R ankle   Sleep apnea    CPAP   Past Surgical History:  Procedure Laterality Date   APPENDECTOMY     CATARACT EXTRACTION W/PHACO Left 07/03/2021   Procedure: CATARACT EXTRACTION PHACO AND INTRAOCULAR LENS PLACEMENT (IOC) LEFT VIVITY TORIC 3.59 00:27.8;  Surgeon: Myrna Adine Anes, MD;  Location: [facility];  Service: Ophthalmology;  Laterality: Left;  sleep apnea   CATARACT EXTRACTION W/PHACO Right 07/17/2021   Procedure: CATARACT EXTRACTION PHACO AND INTRAOCULAR LENS PLACEMENT (IOC) RIGHT VIVITY TORIC 4.20 00:33.2;  Surgeon: Myrna Adine Anes, MD;  Location: [facility];  Service: Ophthalmology;  Laterality: Right;  sleep apnea   NASAL SEPTUM SURGERY     Patient Active Problem List   Diagnosis Date Noted   Difficulty in walking, not elsewhere classified 07/18/2022   Adult hypothyroidism 12/25/2014   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  I am doing the exercises hot and cold by compression glove.  I can do most everything.  But then my fingers hurts.  My right middle finger and ring finger joints.  With gripping with like cutting with a knife and washing the car.  Using a hammer or shovel.  Do know if I have some arthritis. Pt accompanied by: self  PERTINENT HISTORY: Chad Gaines is a 78 year old male who is s/p two trigger finger releases  on 06/26/2023 with persistent symptoms.   PRECAUTIONS: None  RED FLAGS: None   WEIGHT BEARING RESTRICTIONS: No  PAIN:  Are you having pain?  PIPs of right 3rd and 4th 3/10 FALLS: Has patient fallen in last 6 months? No  LIVING ENVIRONMENT: Lives with: lives with their family and lives with their spouse Lives in: House/apartment Stairs: Yes: Internal: 18 steps; on right going up Has following equipment at home: None  PLOF: Independent  PATIENT GOALS: Pt reports he would like to have a pain free hand and be able to perform daily tasks without difficulty .  NEXT MD VISIT: unsure  OBJECTIVE:  Note: Objective measures were completed at Evaluation unless otherwise noted.  HAND DOMINANCE: Right  ADLs: Overall ADLs: Pt able to  complete basic ADLs with effort, mild difficulty with buttoning (slower), opening jars and containers, improving with tying shoes and managing clothing which was difficult initially.  Pt would like to be able to complete yard work tasks including management and functional use of tools:  shovel, rake, hand spade.    FUNCTIONAL OUTCOME MEASURES: TBD  UPPER EXTREMITY ROM:     Active ROM Right eval Left eval  Shoulder flexion 140 140  Shoulder abduction    Shoulder adduction    Shoulder extension    Shoulder internal rotation     Shoulder external rotation    Elbow flexion WNL WNL  Elbow extension WNL WNL  Wrist flexion 70 70  Wrist extension 60 70  Wrist ulnar deviation    Wrist radial deviation    Wrist pronation WNL WNL  Wrist supination WNL WNL  (Blank rows = not tested)  Active ROM Right eval Left eval R 08/27/23 R 08/29/23 R 10/08/23  Thumb MCP (0-60)       Thumb IP (0-80)       Thumb Radial abd/add (0-55)        Thumb Palmar abd/add (0-45)        Thumb Opposition to Small Finger Tightness to SF WNL      Index MCP (0-90)        Index PIP (0-100)        Index DIP (0-70)         Long MCP (0-90)     90 90 90  Long PIP (0-100)     90 95 90  Long DIP (0-70)     70 70 65  Ring MCP (0-90)         Ring PIP (0-100)         Ring DIP (0-70)         Little MCP (0-90)         Little PIP (0-100)         Little DIP (0-70)         (Blank rows = not tested) At eval -Pt with full composite fisting bilaterally but reports stiffness with movement on right, reports pain at the top of the right fingers.  UPPER EXTREMITY MMT:     MMT Right eval Left eval  Shoulder flexion 4/5 4/5  Shoulder abduction 4/5 4/5  Shoulder adduction    Shoulder extension    Shoulder internal rotation    Shoulder external rotation    Middle trapezius    Lower trapezius    Elbow flexion 4/5 5/5  Elbow extension    Wrist flexion 4/5 5/5  Wrist extension 4/5 5/5  Wrist ulnar deviation    Wrist radial deviation    Wrist pronation    Wrist supination    (Blank rows = not tested)  HAND FUNCTION: NT at eval 08/21/23 08/29/23 Grip strength: Right: 20 lbs; Left: 60 lbs, Lateral pinch: Right: 19 lbs, Left: 22 lbs, and 3 point pinch: Right: 14 lbs, Left: 13 lbs 09/02/23 Grip strength: Right: 54 lbs; Left: 60 lbs,  09/17/23 Grip strength: Right: 60 lbs; Left: 60 lbs, Lateral pinch: Right: 23 lbs, Left: 22 lbs, and 3 point pinch: Right: 16 lbs, Left: 13 lbs 10/08/23 Grip strength: Right: 65 lbs; Left: 60 lbs, Lateral pinch: Right: 21 lbs,  Left: 22 lbs, and 3 point pinch: Right: 17 lbs, Left: 13 lbs  COORDINATION: Impaired coordination and hand function with increased pain and stiffness at eval  But improving  in all  SENSATION: Denies any numbness or tingling in hand.  EDEMA: Edema noted in right hand in palm.  COGNITION: Overall cognitive status: Within functional limits for tasks assessed   TREATMENT DATE: 10/08/2023              Patient arrived with reports still of soreness and discomfort at the 3rd and 4th PIP joints.  Patient also tender.  Painful with doing any pressure like cutting with a knife, or washing car, using a shovel or a hammer -as well as squeezing something tight                                                                                                           Patient continues to be limited 5 to 10 degrees at endrange PIP flexion as well as DIP.  Also discussed with patient's 3-1/2 months postop as well as summer and with a heat probably will increase swelling.  Patient expected to be a little stiff and tight with the edema at the endrange flexion as well as endrange extension in the morning. Grip improved and prehension maintained. See flowsheet   Reinforced with patient to continue in the morning with contrast and tendon glides.  10 reps  As well as a  prayer stretches 10 reps for composite extension And opposition. Patient to check with pharmacist if he can use Voltaren ointment maybe in the morning and evening on the PIP joints as well as volar hand  PATIENT EDUCATION: Education details: use of contrast for edema control, tendon gliding exercises, scar massage Person educated: Patient Education method: Explanation, Demonstration, and Handouts Education comprehension: verbalized understanding and needs further education  HOME EXERCISE PROGRAM: Pt instructed on use of contrast for edema management, ROM with tendon gliding exercises and scar massage.  GOALS: Goals reviewed with  patient? Yes  SHORT TERM GOALS: Target date: 09/09/2023  Pt to demonstrate independence in performing home program Baseline: no current program Goal status: Met   LONG TERM GOALS: Target date: 12/31/2023  Pt will demonstrate increase in functional grip strength to manage and open jars and containers with ease. Baseline: difficulty with opening jars and containers. Goal status: Met  2.  Pt will demonstrate ability to hold yard tools in right hand with secure grasp.   Baseline: difficulty holding tools and using functionally. NOW can do but increased discomfort and pain at the 3rd and 4th PIPs Goal status: Partial  3.  Pt will demonstrate ability to perform yard work with pain 2/10 or less. Baseline: 5/10 pain at rest and increased with use. NOW soreness at the PIP of the 3rd and 4th with impact and grasping tools 3/10 Goal status: Partial  4.  Pt to demonstrate managing buttons with normal speed and ease Baseline: difficulty at times and requires increased time to complete Goal status: MET  ASSESSMENT:  CLINICAL IMPRESSION: Patient seen and occupational therapy for s/p right trigger finger release of middle and ring fingers on 06/26/2023 with persistent symptoms and pain.  At evaluation patient presents  with increased pain in right 3rd  and 4th digits, edema, decreased overall functional use, decreased strength in functional grip and pinch skills, and decreased ability to perform ADL and IADL tasks.  Patient made great progress from start of care in 7 visits.  Patient return after doing home exercises for 3 weeks.  Patient continues to have some edema in the 3rd and 4th digits with decrease of 5-10 degrees at composite flexion as expected-and being summer.  Patient report increased functional use and grip and prehension improved.  Stronger with the right hand than the left.  The patient continues to have some soreness and discomfort at right 3rd and 4th PIP joints with tenderness.   Patient to check with pharmacist F can use Voltaren.  Patient can follow-up with me as needed.  Patient requesting follow-up in 3 months after summer.  Patient to cont with  Isotoner glove at night-Pt would benefit from skilled OT services to maximize safety and independence in necessary daily tasks and prevent returning of trigger fingers.SABRA    PERFORMANCE DEFICITS: in functional skills including ADLs, IADLs, coordination, dexterity, edema, ROM, strength, pain, fascial restrictions, flexibility, Fine motor control, and UE functional use, and psychosocial skills including environmental adaptation, habits, and routines and behaviors.   IMPAIRMENTS: are limiting patient from ADLs, IADLs, and leisure.   COMORBIDITIES: may have co-morbidities  that affects occupational performance. Patient will benefit from skilled OT to address above impairments and improve overall function.  MODIFICATION OR ASSISTANCE TO COMPLETE EVALUATION: Min-Moderate modification of tasks or assist with assess necessary to complete an evaluation.  OT OCCUPATIONAL PROFILE AND HISTORY: Detailed assessment: Review of records and additional review of physical, cognitive, psychosocial history related to current functional performance.  CLINICAL DECISION MAKING: Moderate - several treatment options, min-mod task modification necessary  REHAB POTENTIAL: Good  EVALUATION COMPLEXITY: Moderate    PLAN:  OT FREQUENCY: 1-2 visits  OT DURATION: 3 months  PLANNED INTERVENTIONS: 97168 OT Re-evaluation, 97535 self care/ADL training, 02889 therapeutic exercise, 97530 therapeutic activity, 97112 neuromuscular re-education, 97140 manual therapy, 97035 ultrasound, 97018 paraffin, 02960 fluidotherapy, 97010 moist heat, 97010 cryotherapy, 97034 contrast bath, scar mobilization, patient/family education, and DME and/or AE instructions  RECOMMENDED OTHER SERVICES: none  CONSULTED AND AGREED WITH PLAN OF CARE: Patient  PLAN FOR NEXT SESSION:  continued focus on edema management, ROM and scar management, add to home exercise program  [employer] Preez OTR/L,CLT 10/08/2023, 9:16 AM

## 2023-10-14 ENCOUNTER — Ambulatory Visit: Admitting: Occupational Therapy

## 2023-10-15 ENCOUNTER — Encounter: Admitting: Occupational Therapy

## 2023-11-06 NOTE — Progress Notes (Signed)
 signed

## 2023-11-21 NOTE — Progress Notes (Signed)
 Patient Profile:   Chad Gaines  is a 78 y.o.  male Chief Complaint  Patient presents with  . Follow-up      PROBLEM LIST: Past Medical History:  Diagnosis Date  . Allergy    [place] Gal  . Arthritis   . Cataract cortical, senile    developong  . 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 CLL (chronic lymphocytic leukemia) (CMS/HHS-HCC)   . Essential hypertension with goal blood pressure less than 140/90   . Hyperlipidemia, mixed 09/07/2019  . Hypothyroidism   . MVP (mitral valve prolapse)   . Neuropathy   . Sleep apnea    obstructive    Past Surgical History:  Procedure Laterality Date  . INCISION TENDON SHEATH FOR TRIGGER FINGER Right 05/24/2021   Thumb, by Dr. Kathlynn  . ARTHROPLASTY HIP TOTAL Right 06/12/2022   Procedure: RIGHT ARTHROPLASTY, ACETABULAR AND PROXIMAL FEMORAL PROSTHETIC REPLACEMENT (TOTAL HIP ARTHROPLASTY), WITH OR WITHOUT AUTOGRAFT OR ALLOGRAFT;  Surgeon: Sande Ozell Mt, MD;  Location: [facility];  Service: Orthopedics;  Laterality: Right;  . Trigger finger release Right 06/26/2023   Rt Middle and Ring  . APPENDECTOMY    . CATARACT EXTRACTION  2023  . COLON SURGERY     intestinal surgery  . Deviated septum surgery    . JOINT REPLACEMENT  05/2022   right hip  . KNEE ARTHROSCOPY    . MOHS 1 STAGE HEAD/NECK/HAND/FEET/GENTIAL      ALLERGIES: Allergies  Allergen Reactions  . Alpha-Gal (Galactose-Alpha-1,3-Galactose) Swelling    Swelling/ pain in the mouth    CURRENT MEDICATIONS: Current Outpatient Medications  Medication Sig Dispense Refill  . cyanocobalamin, vitamin B-12, 1,000 mcg Lozg Place 1 lozenge under the tongue every other day       . etodolac (LODINE) 400 MG tablet Take 1 tablet (400 mg total) by mouth 2 (two) times daily 180 tablet 3  . levothyroxine (SYNTHROID) 50 MCG tablet Take 1 tablet (50 mcg total) by mouth every morning before breakfast (0630) ON AN EMPTY  STOMACH WITH A GLASS OF WATER AT LEAST 30-60 MINUTES BEFORE BREAKFAST 90 tablet 3  . losartan (COZAAR) 100 MG tablet Take 1 tablet (100 mg total) by mouth once daily 90 tablet 3  . multivitamin tablet Take 1 tablet by mouth once daily    . pantoprazole (PROTONIX) 40 MG DR tablet Take 1 tablet (40 mg total) by mouth once daily 90 tablet 3  . rosuvastatin (CRESTOR) 10 MG tablet Take 1 tablet (10 mg total) by mouth once daily 90 tablet 3  . temazepam (RESTORIL) 15 mg capsule Take 1 capsule (15 mg total) by mouth at bedtime as needed for Sleep for up to 180 days 30 capsule 5   No current facility-administered medications for this visit.      HPI   CLINICAL SUMMARY:  Patient continues to have substantial pain down his right hand, burning pain at, this is been going on for months and really has become very troublesome and life irritating to him, known cervical disc disease.  Starting to have some issues with strength in that hand.  He has been very tired, not sleeping well, only sleeps about 4 to 5 hours a night despite his apnea machine.  He cannot hear out of his right ear really much at all.  ROS: Review  of systems is unremarkable for any active cardiac, respiratory, GI, GU, hematologic, neurologic, dermatologic, HEENT, or psychiatric symptoms except as noted above, 10 systems reviewed.  No fevers, chills, or constitutional symptoms.   PHYSICAL EXAM  Vital signs:  BP 138/70   Pulse 61   Wt 96.4 kg (212 lb 9.6 oz)   SpO2 96%   BMI 29.65 kg/m  Body mass index is 29.65 kg/m.   Wt Readings from Last 3 Encounters:  11/21/23 96.4 kg (212 lb 9.6 oz)  08/01/23 96.6 kg (213 lb)  07/10/23 97.1 kg (214 lb)     BP Readings from Last 3 Encounters:  11/21/23 138/70  08/01/23 (!) 142/80  07/10/23 (!) 140/66    Constitutional:NAD HEENT-impacted cerumen right external canal Neck: supple, no thyromegaly, good ROM Respiratory:clear to auscultation, no rales or wheezes Cardiovascular:RRR, no  murmur or gallop Abdominal:soft, good BS, NT Ext: no edema, good peripheral pulses Neuro: alert and oriented X 3, slightly decreased right hand grip     ASSESSMENT/PLAN   Cerumen impaction-right external canal, was removed mechanically with improvement in hearing Right cervical radiculitis-has been progressing over time, now has really started bothering him substantially.  Cervical x-ray, cervical MRI, hopefully will respond to [facility] Fatigue-getting better sleep is important.  Initiate temazepam 15 mg at bedtime, only getting about 4 hours of sleep Central apnea-tolerating the CPAP machine CLL-white count improved to 17,000 from 20,000  Dispo:   Return in about 6 months (around 05/23/2024) for physical.

## 2023-11-25 ENCOUNTER — Other Ambulatory Visit: Payer: Self-pay | Admitting: Internal Medicine

## 2023-11-25 DIAGNOSIS — M5010 Cervical disc disorder with radiculopathy, unspecified cervical region: Secondary | ICD-10-CM

## 2023-11-27 ENCOUNTER — Ambulatory Visit
Admission: RE | Admit: 2023-11-27 | Discharge: 2023-11-27 | Disposition: A | Discharge status: Home / Self Care | Source: Ambulatory Visit | Attending: Internal Medicine | Admitting: Internal Medicine

## 2023-11-27 DIAGNOSIS — M5010 Cervical disc disorder with radiculopathy, unspecified cervical region: Secondary | ICD-10-CM | POA: Insufficient documentation

## 2023-11-28 ENCOUNTER — Other Ambulatory Visit

## 2023-12-26 ENCOUNTER — Ambulatory Visit: Attending: Orthopedic Surgery | Admitting: Occupational Therapy

## 2023-12-26 DIAGNOSIS — M79641 Pain in right hand: Secondary | ICD-10-CM | POA: Insufficient documentation

## 2023-12-26 DIAGNOSIS — L905 Scar conditions and fibrosis of skin: Secondary | ICD-10-CM | POA: Insufficient documentation

## 2023-12-26 DIAGNOSIS — M25641 Stiffness of right hand, not elsewhere classified: Secondary | ICD-10-CM | POA: Insufficient documentation

## 2024-01-07 ENCOUNTER — Ambulatory Visit: Admitting: Occupational Therapy

## 2024-01-07 DIAGNOSIS — M79641 Pain in right hand: Secondary | ICD-10-CM | POA: Diagnosis present

## 2024-01-07 DIAGNOSIS — L905 Scar conditions and fibrosis of skin: Secondary | ICD-10-CM

## 2024-01-07 DIAGNOSIS — M25641 Stiffness of right hand, not elsewhere classified: Secondary | ICD-10-CM

## 2024-01-07 NOTE — Therapy (Signed)
 OUTPATIENT OCCUPATIONAL THERAPY ORTHO TREATMENT/RECERT  Patient Name: Chad Gaines MRN: [id number] DOB:Jul 29, 1945, 78 y.o., male Today's Date: 01/07/2024  PCP: Cleotilde Anes  REFERRING PROVIDER: Kathlynn Sharper  END OF SESSION:  OT End of Session - 01/07/24 1649     Visit Number 8    Number of Visits 8    Date for Recertification  01/07/24    OT Start Time 1619    OT Stop Time 1640    OT Time Calculation (min) 21 min    Activity Tolerance Patient tolerated treatment well    Behavior During Therapy WFL for tasks assessed/performed          Past Medical History:  Diagnosis Date   Allergy to alpha-gal    GERD (gastroesophageal reflux disease)    Hypertension    Pinched nerve    some numbness R arm, R ankle   Sleep apnea    CPAP   Past Surgical History:  Procedure Laterality Date   APPENDECTOMY     CATARACT EXTRACTION W/PHACO Left 07/03/2021   Procedure: CATARACT EXTRACTION PHACO AND INTRAOCULAR LENS PLACEMENT (IOC) LEFT VIVITY TORIC 3.59 00:27.8;  Surgeon: Myrna Adine Anes, MD;  Location: [facility];  Service: Ophthalmology;  Laterality: Left;  sleep apnea   CATARACT EXTRACTION W/PHACO Right 07/17/2021   Procedure: CATARACT EXTRACTION PHACO AND INTRAOCULAR LENS PLACEMENT (IOC) RIGHT VIVITY TORIC 4.20 00:33.2;  Surgeon: Myrna Adine Anes, MD;  Location: [facility];  Service: Ophthalmology;  Laterality: Right;  sleep apnea   NASAL SEPTUM SURGERY     Patient Active Problem List   Diagnosis Date Noted   Difficulty in walking, not elsewhere classified 07/18/2022   Adult hypothyroidism 12/25/2014   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   Apnea, sleep 12/25/2014   Essential (primary) hypertension 08/05/2014   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HCC) 03/08/2014   Chronic lymphocytic leukemia (HCC) 03/08/2014    ONSET DATE: 06/26/2023  REFERRING DIAG: s/p trigger finger release middle finger of the right hand  THERAPY DIAG:  Stiffness of right hand, not  elsewhere classified  Scar condition and fibrosis of skin  Pain in right hand  Rationale for Evaluation and Treatment: Rehabilitation  SUBJECTIVE:   SUBJECTIVE STATEMENT: I just wanted to came back on time to say thank you - I was discourage after surgery and even beginning of therapy but you were positive and I am good -I use my hand normally and stopped doing the exercises every day Pt accompanied by: self  PERTINENT HISTORY: [name] is a 78 year old male who is s/p two trigger finger releases  on 06/26/2023 with persistent symptoms.   PRECAUTIONS: None  RED FLAGS: None   WEIGHT BEARING RESTRICTIONS: No  PAIN:  Are you having pain?  Stiffness more than pain FALLS: Has patient fallen in last 6 months? No  LIVING ENVIRONMENT: Lives with: lives with their family and lives with their spouse Lives in: House/apartment Stairs: Yes: Internal: 18 steps; on right going up Has following equipment at home: None  PLOF: Independent  PATIENT GOALS: Pt reports he would like to have a pain free hand and be able to perform daily tasks without difficulty .  NEXT MD VISIT: unsure  OBJECTIVE:  Note: Objective measures were completed at Evaluation unless otherwise noted.  HAND DOMINANCE: Right  ADLs: Overall ADLs: Pt able to complete basic ADLs with effort, mild difficulty with buttoning (slower), opening jars and containers, improving with tying shoes and managing clothing which was difficult initially.  Pt would  like to be able to complete yard work tasks including management and functional use of tools:  shovel, rake, hand spade.    FUNCTIONAL OUTCOME MEASURES: TBD  UPPER EXTREMITY ROM:     Active ROM Right eval Left eval  Shoulder flexion 140 140  Shoulder abduction    Shoulder adduction    Shoulder extension    Shoulder internal rotation    Shoulder external rotation    Elbow flexion WNL WNL  Elbow extension WNL WNL  Wrist flexion 70 70  Wrist extension 60 70   Wrist ulnar deviation    Wrist radial deviation    Wrist pronation WNL WNL  Wrist supination WNL WNL  (Blank rows = not tested)  Active ROM Right eval Left eval R 08/27/23 R 08/29/23 R 10/08/23  Thumb MCP (0-60)       Thumb IP (0-80)       Thumb Radial abd/add (0-55)        Thumb Palmar abd/add (0-45)        Thumb Opposition to Small Finger Tightness to SF WNL      Index MCP (0-90)        Index PIP (0-100)        Index DIP (0-70)         Long MCP (0-90)     90 90 90  Long PIP (0-100)     90 95 90  Long DIP (0-70)     70 70 65  Ring MCP (0-90)         Ring PIP (0-100)         Ring DIP (0-70)         Little MCP (0-90)         Little PIP (0-100)         Little DIP (0-70)         (Blank rows = not tested) At eval -Pt with full composite fisting bilaterally but reports stiffness with movement on right, reports pain at the top of the right fingers.  UPPER EXTREMITY MMT:     MMT Right eval Left eval  Shoulder flexion 4/5 4/5  Shoulder abduction 4/5 4/5  Shoulder adduction    Shoulder extension    Shoulder internal rotation    Shoulder external rotation    Middle trapezius    Lower trapezius    Elbow flexion 4/5 5/5  Elbow extension    Wrist flexion 4/5 5/5  Wrist extension 4/5 5/5  Wrist ulnar deviation    Wrist radial deviation    Wrist pronation    Wrist supination    (Blank rows = not tested)  HAND FUNCTION: NT at eval 08/21/23 08/29/23 Grip strength: Right: 20 lbs; Left: 60 lbs, Lateral pinch: Right: 19 lbs, Left: 22 lbs, and 3 point pinch: Right: 14 lbs, Left: 13 lbs 09/02/23 Grip strength: Right: 54 lbs; Left: 60 lbs,  09/17/23 Grip strength: Right: 60 lbs; Left: 60 lbs, Lateral pinch: Right: 23 lbs, Left: 22 lbs, and 3 point pinch: Right: 16 lbs, Left: 13 lbs 10/08/23 Grip strength: Right: 65 lbs; Left: 60 lbs, Lateral pinch: Right: 21 lbs, Left: 22 lbs, and 3 point pinch: Right: 17 lbs, Left: 13 lbs 01/07/24 Grip strength: Right: 80 lbs; Left: 90 lbs, Lateral  pinch: Right: 26 lbs, Left: 24 lbs, and 3 point pinch: Right: 20 lbs, Left: 18 lbs  COORDINATION: Impaired coordination and hand function with increased pain and stiffness at eval  But improving  in all  SENSATION: Denies any numbness or tingling in hand.  EDEMA: Edema noted in right hand in palm.  COGNITION: Overall cognitive status: Within functional limits for tasks assessed   TREATMENT DATE: 01/07/2024              Patient arrived after not being seen for 3 months.  Patient requested to want to follow-up 3 months later to see if still progressing and maintaining progress. Patient reported able to do everything.  No pain more stiffness.  In the morning. Patient you have a tendency still to over grip causing MC flexion past 90 degrees. Enforce with patient light grip as well as enlarging grips and using larger joints To work smarter not harder Patient's grip and prehension strength improved greatly over the last 3 months.  PATIENT EDUCATION: Education details: use of contrast for edema control, tendon gliding exercises, scar massage Person educated: Patient Education method: Explanation, Demonstration, and Handouts Education comprehension: verbalized understanding and needs further education  HOME EXERCISE PROGRAM: Pt instructed on use of contrast for edema management, ROM with tendon gliding exercises and scar massage.  GOALS: Goals reviewed with patient? Yes  SHORT TERM GOALS: Target date: 09/09/2023  Pt to demonstrate independence in performing home program Baseline: no current program Goal status: Met   LONG TERM GOALS: Target date: 12/31/2023  Pt will demonstrate increase in functional grip strength to manage and open jars and containers with ease. Baseline: difficulty with opening jars and containers. Goal status: Met  2.  Pt will demonstrate ability to hold yard tools in right hand with secure grasp.   Baseline: difficulty holding tools and using  functionally. NOW can do but increased discomfort and pain at the 3rd and 4th PIPs Goal status: Met  3.  Pt will demonstrate ability to perform yard work with pain 2/10 or less. Baseline: 5/10 pain at rest and increased with use. NOW soreness at the PIP of the 3rd and 4th with impact and grasping tools 3/10 Goal status: Met  4.  Pt to demonstrate managing buttons with normal speed and ease Baseline: difficulty at times and requires increased time to complete Goal status: MET  ASSESSMENT:  CLINICAL IMPRESSION: Patient seen and occupational therapy for s/p right trigger finger release of middle and ring fingers on 06/26/2023 with persistent symptoms and pain.  At evaluation patient presents  with increased pain in right 3rd and 4th digits, edema, decreased overall functional use, decreased strength in functional grip and pinch skills, and decreased ability to perform ADL and IADL tasks.  Patient returned today after completing home exercises as well as just normal use for the last 3 months.  Patient requested to have a follow-up after the summer to reassess.  Patient report some stiffness in the morning but no pain.  Able to use normally.  Patient still does have a tendency to over grip to hard -patient's grip and prehension strength improved greatly.  See flowsheet.  Patient has knowledge somewhat patient's patient in agreement to be discharged.      PERFORMANCE DEFICITS: in functional skills including ADLs, IADLs, coordination, dexterity, edema, ROM, strength, pain, fascial restrictions, flexibility, Fine motor control, and UE functional use, and psychosocial skills including environmental adaptation, habits, and routines and behaviors.   IMPAIRMENTS: are limiting patient from ADLs, IADLs, and leisure.   COMORBIDITIES: may have co-morbidities  that affects occupational performance. Patient will benefit from skilled OT to address above impairments and improve overall function.  MODIFICATION OR  ASSISTANCE TO COMPLETE EVALUATION: Min-Moderate modification  of tasks or assist with assess necessary to complete an evaluation.  OT OCCUPATIONAL PROFILE AND HISTORY: Detailed assessment: Review of records and additional review of physical, cognitive, psychosocial history related to current functional performance.  CLINICAL DECISION MAKING: Moderate - several treatment options, min-mod task modification necessary  REHAB POTENTIAL: Good  EVALUATION COMPLEXITY: Moderate    PLAN:  OT FREQUENCY: 1 visit  OT DURATION: 1 wks  PLANNED INTERVENTIONS: 97168 OT Re-evaluation, 97535 self care/ADL training, 02889 therapeutic exercise, 97530 therapeutic activity, 97112 neuromuscular re-education, 97140 manual therapy, 97035 ultrasound, 97018 paraffin, 02960 fluidotherapy, 97010 moist heat, 97010 cryotherapy, 97034 contrast bath, scar mobilization, patient/family education, and DME and/or AE instructions  RECOMMENDED OTHER SERVICES: none  CONSULTED AND AGREED WITH PLAN OF CARE: Patient  PLAN FOR NEXT SESSION: continued focus on edema management, ROM and scar management, add to home exercise program  [employer] [place] OTR/L,CLT 01/07/2024, 4:51 PM
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
## 2024-02-20 ENCOUNTER — Ambulatory Visit: Attending: Physical Medicine and Rehabilitation

## 2024-02-20 DIAGNOSIS — M542 Cervicalgia: Secondary | ICD-10-CM | POA: Diagnosis present

## 2024-02-27 ENCOUNTER — Ambulatory Visit

## 2024-03-05 ENCOUNTER — Ambulatory Visit

## 2024-03-05 DIAGNOSIS — M542 Cervicalgia: Secondary | ICD-10-CM
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
## 2024-03-10 ENCOUNTER — Ambulatory Visit
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
## 2024-03-16 ENCOUNTER — Ambulatory Visit

## 2024-03-16 DIAGNOSIS — M542 Cervicalgia: Secondary | ICD-10-CM

## 2024-03-18 ENCOUNTER — Ambulatory Visit
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
## 2024-03-23 ENCOUNTER — Ambulatory Visit: Attending: Physical Medicine and Rehabilitation

## 2024-03-23 DIAGNOSIS — M542 Cervicalgia: Secondary | ICD-10-CM | POA: Diagnosis present

## 2024-03-25 ENCOUNTER — Ambulatory Visit

## 2024-03-30 ENCOUNTER — Ambulatory Visit

## 2024-03-30 DIAGNOSIS — M542 Cervicalgia: Secondary | ICD-10-CM | POA: Diagnosis not present
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
## 2024-04-01 ENCOUNTER — Ambulatory Visit

## 2024-04-06 ENCOUNTER — Ambulatory Visit

## 2024-04-08 ENCOUNTER — Ambulatory Visit

## 2024-04-13 ENCOUNTER — Ambulatory Visit
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
## 2024-04-15 ENCOUNTER — Ambulatory Visit

## 2024-04-20 ENCOUNTER — Ambulatory Visit: Attending: Physical Medicine and Rehabilitation

## 2024-04-20 DIAGNOSIS — M542 Cervicalgia: Secondary | ICD-10-CM | POA: Insufficient documentation

## 2024-04-22 ENCOUNTER — Ambulatory Visit

## 2024-04-28 ENCOUNTER — Ambulatory Visit

## 2024-04-28 DIAGNOSIS — M542 Cervicalgia: Secondary | ICD-10-CM | POA: Diagnosis not present
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
## 2024-04-29 ENCOUNTER — Ambulatory Visit
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
## 2024-05-05 ENCOUNTER — Ambulatory Visit

## 2024-05-05 DIAGNOSIS — M542 Cervicalgia: Secondary | ICD-10-CM

## 2024-05-11 ENCOUNTER — Ambulatory Visit
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
## 2024-05-19 ENCOUNTER — Ambulatory Visit: Attending: Physical Medicine and Rehabilitation

## 2024-05-19 DIAGNOSIS — M542 Cervicalgia: Secondary | ICD-10-CM

## 2024-05-26 ENCOUNTER — Ambulatory Visit

## 2024-06-02 ENCOUNTER — Ambulatory Visit

## 2024-06-09 ENCOUNTER — Ambulatory Visit
